# Patient Record
Sex: Male | Born: 1940 | Race: White | Hispanic: No | Marital: Married | Smoking: Former smoker
Health system: Southern US, Community
[De-identification: ages and names within clinical notes are randomized; demographics above are authoritative.]

## PROBLEM LIST (undated history)

## (undated) DIAGNOSIS — E7800 Pure hypercholesterolemia, unspecified: Secondary | ICD-10-CM

## (undated) DIAGNOSIS — H409 Unspecified glaucoma: Secondary | ICD-10-CM

## (undated) DIAGNOSIS — I5042 Chronic combined systolic (congestive) and diastolic (congestive) heart failure: Secondary | ICD-10-CM

## (undated) DIAGNOSIS — M722 Plantar fascial fibromatosis: Secondary | ICD-10-CM

## (undated) DIAGNOSIS — R112 Nausea with vomiting, unspecified: Secondary | ICD-10-CM

## (undated) DIAGNOSIS — T8859XA Other complications of anesthesia, initial encounter: Secondary | ICD-10-CM

## (undated) DIAGNOSIS — R3581 Nocturnal polyuria: Secondary | ICD-10-CM

## (undated) DIAGNOSIS — T4145XA Adverse effect of unspecified anesthetic, initial encounter: Secondary | ICD-10-CM

## (undated) DIAGNOSIS — I10 Essential (primary) hypertension: Secondary | ICD-10-CM

## (undated) DIAGNOSIS — Z9889 Other specified postprocedural states: Secondary | ICD-10-CM

## (undated) DIAGNOSIS — R9431 Abnormal electrocardiogram [ECG] [EKG]: Secondary | ICD-10-CM

## (undated) DIAGNOSIS — R351 Nocturia: Secondary | ICD-10-CM

## (undated) DIAGNOSIS — R3912 Poor urinary stream: Secondary | ICD-10-CM

## (undated) HISTORY — PX: OTHER SURGICAL HISTORY: SHX169

## (undated) HISTORY — DX: Pure hypercholesterolemia, unspecified: E78.00

## (undated) HISTORY — DX: Plantar fascial fibromatosis: M72.2

## (undated) HISTORY — DX: Nocturia: R35.1

## (undated) HISTORY — DX: Chronic combined systolic (congestive) and diastolic (congestive) heart failure: I50.42

## (undated) HISTORY — DX: Essential (primary) hypertension: I10

## (undated) HISTORY — DX: Nocturnal polyuria: R35.81

## (undated) HISTORY — DX: Unspecified glaucoma: H40.9

---

## 1983-01-27 HISTORY — PX: OTHER SURGICAL HISTORY: SHX169

## 2003-02-02 ENCOUNTER — Ambulatory Visit (HOSPITAL_COMMUNITY): Admission: RE | Admit: 2003-02-02 | Discharge: 2003-02-02 | Payer: Self-pay | Admitting: Orthopedic Surgery

## 2009-03-27 ENCOUNTER — Encounter: Admission: RE | Admit: 2009-03-27 | Discharge: 2009-03-27 | Payer: Self-pay | Admitting: Cardiology

## 2010-04-18 ENCOUNTER — Other Ambulatory Visit: Payer: Self-pay | Admitting: *Deleted

## 2010-04-18 DIAGNOSIS — N529 Male erectile dysfunction, unspecified: Secondary | ICD-10-CM

## 2010-04-18 MED ORDER — SILDENAFIL CITRATE 50 MG PO TABS: 50.0000 mg | ORAL_TABLET | Freq: Every day | ORAL | Status: DC | PRN

## 2010-04-18 NOTE — Telephone Encounter (Signed)
Refill requested via fax

## 2010-05-08 ENCOUNTER — Encounter: Payer: Self-pay | Admitting: Cardiology

## 2010-05-08 DIAGNOSIS — R351 Nocturia: Secondary | ICD-10-CM | POA: Insufficient documentation

## 2010-05-08 DIAGNOSIS — I10 Essential (primary) hypertension: Secondary | ICD-10-CM | POA: Insufficient documentation

## 2010-05-08 DIAGNOSIS — M722 Plantar fascial fibromatosis: Secondary | ICD-10-CM | POA: Insufficient documentation

## 2010-05-08 DIAGNOSIS — E7800 Pure hypercholesterolemia, unspecified: Secondary | ICD-10-CM | POA: Insufficient documentation

## 2010-05-09 ENCOUNTER — Ambulatory Visit (INDEPENDENT_AMBULATORY_CARE_PROVIDER_SITE_OTHER): Payer: Medicare Other | Admitting: Cardiology

## 2010-05-09 VITALS — BP 138/78 | HR 64 | Ht 66.0 in | Wt 170.0 lb

## 2010-05-09 DIAGNOSIS — E7800 Pure hypercholesterolemia, unspecified: Secondary | ICD-10-CM

## 2010-05-09 DIAGNOSIS — I10 Essential (primary) hypertension: Secondary | ICD-10-CM

## 2010-05-09 DIAGNOSIS — N400 Enlarged prostate without lower urinary tract symptoms: Secondary | ICD-10-CM | POA: Insufficient documentation

## 2010-05-09 LAB — LIPID PANEL
Cholesterol: 195 mg/dL (ref 0–200)
HDL: 46.9 mg/dL (ref >39.00)
LDL Cholesterol: 120 mg/dL — ABNORMAL HIGH (ref 0–99)
Total CHOL/HDL Ratio: 4
Triglycerides: 143 mg/dL (ref 0.0–149.0)
VLDL: 28.6 mg/dL (ref 0.0–40.0)

## 2010-05-09 LAB — COMPREHENSIVE METABOLIC PANEL
ALT: 30 U/L (ref 0–53)
AST: 22 U/L (ref 0–37)
Albumin: 4.1 g/dL (ref 3.5–5.2)
Alkaline Phosphatase: 53 U/L (ref 39–117)
BUN: 12 mg/dL (ref 6–23)
CO2: 27 mEq/L (ref 19–32)
Calcium: 9.2 mg/dL (ref 8.4–10.5)
Chloride: 106 mEq/L (ref 96–112)
Creatinine, Ser: 0.9 mg/dL (ref 0.4–1.5)
GFR: 91.39 mL/min (ref >60.00)
Glucose, Bld: 94 mg/dL (ref 70–99)
Potassium: 4.6 mEq/L (ref 3.5–5.1)
Sodium: 142 mEq/L (ref 135–145)
Total Bilirubin: 1 mg/dL (ref 0.3–1.2)
Total Protein: 6.5 g/dL (ref 6.0–8.3)

## 2010-05-09 LAB — CBC WITH DIFFERENTIAL/PLATELET
Basophils Absolute: 0 10*3/uL (ref 0.0–0.1)
Basophils Relative: 0.7 % (ref 0.0–3.0)
Eosinophils Absolute: 0.1 10*3/uL (ref 0.0–0.7)
Eosinophils Relative: 2.2 % (ref 0.0–5.0)
HCT: 48.1 % (ref 39.0–52.0)
Hemoglobin: 16.3 g/dL (ref 13.0–17.0)
Lymphocytes Relative: 24.2 % (ref 12.0–46.0)
Lymphs Abs: 1.2 10*3/uL (ref 0.7–4.0)
MCHC: 33.9 g/dL (ref 30.0–36.0)
MCV: 92.8 fl (ref 78.0–100.0)
Monocytes Absolute: 0.6 10*3/uL (ref 0.1–1.0)
Monocytes Relative: 12.1 % — ABNORMAL HIGH (ref 3.0–12.0)
Neutro Abs: 3 10*3/uL (ref 1.4–7.7)
Neutrophils Relative %: 60.8 % (ref 43.0–77.0)
Platelets: 179 10*3/uL (ref 150.0–400.0)
RBC: 5.19 Mil/uL (ref 4.22–5.81)
RDW: 13.1 % (ref 11.5–14.6)
WBC: 4.9 10*3/uL (ref 4.5–10.5)

## 2010-05-09 NOTE — Progress Notes (Signed)
Sherlynn Stalls Date of Birth:  June 29, 1940 [facility] / [facility] [address]   [address] [place], [place]  [address] [phone]           Fax   [phone] DAMEIN GAUNCE Date of Birth:  05/22/1940 [facility] / [facility] [address]   [address] [place], [place]  [address] [phone]           Fax   [phone]  HPI: This pleasant 70 year old gentleman is seen for a six-month followup office visit.  He has been in good general health.  He has had a past history of labile hypertension as well as a history of high cholesterol.  He had a normal treadmill Cardiolite stress test in 2005.  Current Outpatient Prescriptions  Medication Sig Dispense Refill  . aspirin 81 MG tablet Take 81 mg by mouth as needed.       . GuaiFENesin (MUCINEX PO) Take by mouth as needed.        . Multiple Vitamin (MULTIVITAMIN) tablet Take 1 tablet by mouth daily.        . sildenafil (VIAGRA) 50 MG tablet Take 1 tablet (50 mg total) by mouth daily as needed for erectile dysfunction.  3 tablet  3  . rosuvastatin (CRESTOR) 10 MG tablet Take 10 mg by mouth daily.          No Known Allergies  Patient Active Problem List  Diagnoses  . Hypertension  . Hypercholesterolemia  . Nocturnal polyuria  . Plantar fasciitis  . BPH (benign prostatic hyperplasia)    History  Smoking status  . Former Smoker  . Quit date: 01/27/1968  Smokeless tobacco  . Not on file    History  Alcohol Use No    Family History  Problem Relation Age of Onset  . Heart disease Father 69    Review of Systems: The patient denies any heat or cold intolerance.  No weight gain or weight loss.  The patient denies headaches or blurry vision.  There is no cough or sputum production.  The patient denies dizziness.  There is no hematuria or hematochezia.  The patient denies any muscle aches or arthritis.  The patient denies any rash.  The patient denies frequent falling or instability.  There is  no history of depression or anxiety.  All other systems were reviewed and are negative.   Physical Exam: Filed Vitals:   05/09/10 0830  BP: 138/78  Pulse: 64  The general appearance reveals a well-developed well-nourished gentleman in no distress.Pupils equal and reactive.   Extraocular Movements are full.  There is no scleral icterus.  The mouth and pharynx are normal.  The neck is supple.  The carotids reveal no bruits.  The jugular venous pressure is normal.  The thyroid is not enlarged.  There is no lymphadenopathy.The chest is clear to percussion and auscultation. There are no rales or rhonchi. Expansion of the chest is symmetrical.The precordium is quiet.  The first heart sound is normal.  The second heart sound is physiologically split.  There is no murmur gallop rub or click.  There is no abnormal lift or heave.The abdomen is soft and nontender. Bowel sounds are normal. The liver and spleen are not enlarged. There Are no abdominal masses. There are no bruits.The pedal pulses are good.  There is no phlebitis or edema.  There is no cyanosis or clubbing.Strength is normal and symmetrical in all extremities.  There is no lateralizing weakness.  There are no sensory deficits.The skin is warm and dry.  There is no rash.    Assessment / Plan: Continue on prudent cardiac diet.  Recheck in 6 months for followup office visit and lab work.  Blood work from today is pending.

## 2010-05-09 NOTE — Assessment & Plan Note (Signed)
The patient has noted some decreased urinary stream over the past year.  He is having problems with having to go little more frequently.  He's not having any dysuria or symptoms of cystitis or ureter tract infection or prostatitis.  His last PSA was normal in June 2011

## 2010-05-09 NOTE — Assessment & Plan Note (Signed)
The patient has been on low dose Crestor 10 mg daily for his cholesterol.  His not having any myalgias or side effects with the Crestor.

## 2010-05-09 NOTE — Assessment & Plan Note (Signed)
This pleasant gentleman has a past history of labile hypertension.  Since last visit he has lost 12 pounds and he has been more careful with his dietary salt.  He has not been having any chest pain or shortness of breath.

## 2010-05-15 ENCOUNTER — Telehealth: Payer: Self-pay | Admitting: *Deleted

## 2010-05-15 NOTE — Telephone Encounter (Signed)
Advised of labs 

## 2010-05-15 NOTE — Progress Notes (Signed)
Advise patient of labs  

## 2010-05-15 NOTE — Telephone Encounter (Signed)
Message copied by Regis Bill on Thu May 15, 2010  4:10 PM ------      Message from: Cassell Clement      Created: Sun May 11, 2010  4:39 PM       Blood work is satisfactory.  LDL cholesterol120 which is still slightly high.  Stay on Crestor and careful diet.  Liver function studies are normal.

## 2010-10-28 ENCOUNTER — Telehealth: Payer: Self-pay | Admitting: Cardiology

## 2010-10-28 DIAGNOSIS — E785 Hyperlipidemia, unspecified: Secondary | ICD-10-CM

## 2010-10-28 NOTE — Telephone Encounter (Signed)
Pt's wife calling to get order for blood work prior to appt

## 2010-11-12 ENCOUNTER — Other Ambulatory Visit: Payer: Self-pay | Admitting: *Deleted

## 2010-11-12 DIAGNOSIS — N400 Enlarged prostate without lower urinary tract symptoms: Secondary | ICD-10-CM

## 2010-11-12 DIAGNOSIS — Z139 Encounter for screening, unspecified: Secondary | ICD-10-CM

## 2010-11-12 NOTE — Telephone Encounter (Signed)
Thought wife wanted to wait until January to be seen, she called back and scheduled appointment with scheduling today.  Labs ordered for patient

## 2011-01-06 ENCOUNTER — Other Ambulatory Visit (INDEPENDENT_AMBULATORY_CARE_PROVIDER_SITE_OTHER): Payer: Medicare Other | Admitting: *Deleted

## 2011-01-06 ENCOUNTER — Other Ambulatory Visit: Payer: Self-pay | Admitting: Cardiology

## 2011-01-06 DIAGNOSIS — E785 Hyperlipidemia, unspecified: Secondary | ICD-10-CM

## 2011-01-06 DIAGNOSIS — N400 Enlarged prostate without lower urinary tract symptoms: Secondary | ICD-10-CM

## 2011-01-06 LAB — BASIC METABOLIC PANEL
CO2: 26 mEq/L (ref 19–32)
Chloride: 107 mEq/L (ref 96–112)
Creatinine, Ser: 1 mg/dL (ref 0.4–1.5)
GFR: 83.5 mL/min (ref >60.00)
Glucose, Bld: 108 mg/dL — ABNORMAL HIGH (ref 70–99)
Potassium: 4.7 mEq/L (ref 3.5–5.1)
Sodium: 140 mEq/L (ref 135–145)

## 2011-01-06 LAB — HEPATIC FUNCTION PANEL
ALT: 29 U/L (ref 0–53)
AST: 24 U/L (ref 0–37)
Albumin: 4.1 g/dL (ref 3.5–5.2)
Alkaline Phosphatase: 46 U/L (ref 39–117)
Bilirubin, Direct: 0.1 mg/dL (ref 0.0–0.3)
Total Protein: 6.5 g/dL (ref 6.0–8.3)

## 2011-01-06 LAB — PSA: PSA: 2.97 ng/mL (ref 0.10–4.00)

## 2011-01-06 LAB — LIPID PANEL
Cholesterol: 214 mg/dL — ABNORMAL HIGH (ref 0–200)
Total CHOL/HDL Ratio: 5
VLDL: 29.2 mg/dL (ref 0.0–40.0)

## 2011-01-06 LAB — LDL CHOLESTEROL, DIRECT: Direct LDL: 137 mg/dL

## 2011-01-12 ENCOUNTER — Ambulatory Visit (INDEPENDENT_AMBULATORY_CARE_PROVIDER_SITE_OTHER): Payer: Medicare Other | Admitting: Cardiology

## 2011-01-12 ENCOUNTER — Encounter: Payer: Self-pay | Admitting: Cardiology

## 2011-01-12 VITALS — BP 126/78 | HR 80 | Ht 66.0 in | Wt 174.0 lb

## 2011-01-12 DIAGNOSIS — I10 Essential (primary) hypertension: Secondary | ICD-10-CM

## 2011-01-12 DIAGNOSIS — I119 Hypertensive heart disease without heart failure: Secondary | ICD-10-CM

## 2011-01-12 DIAGNOSIS — E7800 Pure hypercholesterolemia, unspecified: Secondary | ICD-10-CM

## 2011-01-12 DIAGNOSIS — N400 Enlarged prostate without lower urinary tract symptoms: Secondary | ICD-10-CM

## 2011-01-12 NOTE — Assessment & Plan Note (Signed)
The patient has a past history of hypercholesterolemia.  He is no longer taking Crestor.  He is trying to watch his cholesterol with diet alone.  He is going to try to increase aerobic exercise and to watch diet better.

## 2011-01-12 NOTE — Patient Instructions (Signed)
Work harder on diet  Your physician recommends that you continue on your current medications as directed. Please refer to the Current Medication list given to you today.  Your physician wants you to follow-up in: 6 months You will receive a reminder letter in the mail two months in advance. If you don't receive a letter, please call our office to schedule the follow-up appointment.

## 2011-01-12 NOTE — Progress Notes (Signed)
Thomas Li Date of Birth:  18-Nov-1940 [facility] / [facility] [address]   [address] [place], [place]  [address] [phone]           Fax   [phone]  History of Present Illness:  This pleasant 70 year old gentleman seen for a six-month followup office visit.  He has a past history hypercholesterolemia BPH and essential hypertension.  Since last visit he has been feeling well.  He has not been expressing any chest pain or shortness of breath.  He has not had any worsening of his symptoms of BPH.  He does not have any history of ischemic heart disease.  He had a normal Cardiolite stress test in 2005.  Current Outpatient Prescriptions  Medication Sig Dispense Refill  . GuaiFENesin (MUCINEX PO) Take by mouth as needed.        . Multiple Vitamin (MULTIVITAMIN) tablet Take 1 tablet by mouth daily.          No Known Allergies  Patient Active Problem List  Diagnoses  . Hypertension  . Hypercholesterolemia  . Nocturnal polyuria  . Plantar fasciitis  . BPH (benign prostatic hyperplasia)    History  Smoking status  . Former Smoker  . Quit date: 01/27/1968  Smokeless tobacco  . Not on file    History  Alcohol Use No    Family History  Problem Relation Age of Onset  . Heart disease Father 5    Review of Systems: Constitutional: no fever chills diaphoresis or fatigue or change in weight.  Head and neck: no hearing loss, no epistaxis, no photophobia or visual disturbance. Respiratory: No cough, shortness of breath or wheezing. Cardiovascular: No chest pain peripheral edema, palpitations. Gastrointestinal: No abdominal distention, no abdominal pain, no change in bowel habits hematochezia or melena. Genitourinary: No dysuria, no frequency, no urgency, no nocturia. Musculoskeletal:No arthralgias, no back pain, no gait disturbance or myalgias. Neurological: No dizziness, no headaches, no numbness, no seizures, no syncope, no weakness, no  tremors. Hematologic: No lymphadenopathy, no easy bruising. Psychiatric: No confusion, no hallucinations, no sleep disturbance.    Physical Exam: Filed Vitals:   01/12/11 0912  BP: 126/78  Pulse: 80   the general appearance reveals a well-developed well-nourished gentleman in no distress.The head and neck exam reveals pupils equal and reactive.  Extraocular movements are full.  There is no scleral icterus.  The mouth and pharynx are normal.  The neck is supple.  The carotids reveal no bruits.  The jugular venous pressure is normal.  The  thyroid is not enlarged.  There is no lymphadenopathy.  The chest is clear to percussion and auscultation.  There are no rales or rhonchi.  Expansion of the chest is symmetrical.  The precordium is quiet.  The first heart sound is normal.  The second heart sound is physiologically split.  There is no murmur gallop rub or click.  There is no abnormal lift or heave.  The abdomen is soft and nontender.  The bowel sounds are normal.  The liver and spleen are not enlarged.  There are no abdominal masses.  There are no abdominal bruits.  Extremities reveal good pedal pulses.  There is no phlebitis or edema.  There is no cyanosis or clubbing.  Strength is normal and symmetrical in all extremities.  There is no lateralizing weakness.  There are no sensory deficits.  The skin is warm and dry.  There is no rash.     Assessment / Plan: Continue present  medication.  Work harder on diet and exercise and weight loss.  He has never had a shingles shot and he was given a prescription to get one at his pharmacy.  Recheck in 6 months for followup office visit fasting lab work and EKG

## 2011-01-12 NOTE — Assessment & Plan Note (Signed)
Blood pressure today remains normal on no blood pressure medication.  He is watching his dietary salt intake.  His overall weight has increased 4 pounds since last visit.

## 2011-01-12 NOTE — Assessment & Plan Note (Signed)
Patient has a history of BPH.  He has nocturia x2 which is no worse than it previously had been.  His PSA today is normal at 2.97.

## 2011-07-17 ENCOUNTER — Other Ambulatory Visit (INDEPENDENT_AMBULATORY_CARE_PROVIDER_SITE_OTHER): Payer: Medicare Other

## 2011-07-17 DIAGNOSIS — I119 Hypertensive heart disease without heart failure: Secondary | ICD-10-CM

## 2011-07-17 DIAGNOSIS — E7800 Pure hypercholesterolemia, unspecified: Secondary | ICD-10-CM

## 2011-07-17 LAB — HEPATIC FUNCTION PANEL
ALT: 30 U/L (ref 0–53)
AST: 21 U/L (ref 0–37)
Albumin: 4.2 g/dL (ref 3.5–5.2)
Alkaline Phosphatase: 45 U/L (ref 39–117)
Bilirubin, Direct: 0.1 mg/dL (ref 0.0–0.3)
Total Bilirubin: 0.7 mg/dL (ref 0.3–1.2)
Total Protein: 6.6 g/dL (ref 6.0–8.3)

## 2011-07-17 LAB — BASIC METABOLIC PANEL
BUN: 14 mg/dL (ref 6–23)
CO2: 26 mEq/L (ref 19–32)
Calcium: 8.9 mg/dL (ref 8.4–10.5)
Chloride: 109 mEq/L (ref 96–112)
Creatinine, Ser: 0.8 mg/dL (ref 0.4–1.5)
GFR: 106.24 mL/min (ref >60.00)
Glucose, Bld: 103 mg/dL — ABNORMAL HIGH (ref 70–99)
Potassium: 4.2 mEq/L (ref 3.5–5.1)
Sodium: 142 mEq/L (ref 135–145)

## 2011-07-17 LAB — LIPID PANEL
Cholesterol: 177 mg/dL (ref 0–200)
HDL: 48.8 mg/dL (ref >39.00)
LDL Cholesterol: 107 mg/dL — ABNORMAL HIGH (ref 0–99)
Total CHOL/HDL Ratio: 4
Triglycerides: 104 mg/dL (ref 0.0–149.0)
VLDL: 20.8 mg/dL (ref 0.0–40.0)

## 2011-07-19 NOTE — Progress Notes (Signed)
Quick Note:  Please make copy of labs for patient visit. ______ 

## 2011-07-24 ENCOUNTER — Encounter: Payer: Self-pay | Admitting: Cardiology

## 2011-07-24 ENCOUNTER — Ambulatory Visit (INDEPENDENT_AMBULATORY_CARE_PROVIDER_SITE_OTHER): Payer: Medicare Other | Admitting: Cardiology

## 2011-07-24 VITALS — BP 126/86 | HR 69 | Ht 66.0 in | Wt 176.0 lb

## 2011-07-24 DIAGNOSIS — L299 Pruritus, unspecified: Secondary | ICD-10-CM

## 2011-07-24 DIAGNOSIS — I447 Left bundle-branch block, unspecified: Secondary | ICD-10-CM

## 2011-07-24 DIAGNOSIS — R351 Nocturia: Secondary | ICD-10-CM

## 2011-07-24 DIAGNOSIS — N529 Male erectile dysfunction, unspecified: Secondary | ICD-10-CM

## 2011-07-24 DIAGNOSIS — E7800 Pure hypercholesterolemia, unspecified: Secondary | ICD-10-CM

## 2011-07-24 DIAGNOSIS — I10 Essential (primary) hypertension: Secondary | ICD-10-CM

## 2011-07-24 DIAGNOSIS — N401 Enlarged prostate with lower urinary tract symptoms: Secondary | ICD-10-CM

## 2011-07-24 DIAGNOSIS — N400 Enlarged prostate without lower urinary tract symptoms: Secondary | ICD-10-CM

## 2011-07-24 MED ORDER — NYSTATIN-TRIAMCINOLONE 100000-0.1 UNIT/GM-% EX CREA: TOPICAL_CREAM | Freq: Four times a day (QID) | CUTANEOUS | Status: DC

## 2011-07-24 MED ORDER — SILDENAFIL CITRATE 50 MG PO TABS: 50.0000 mg | ORAL_TABLET | Freq: Every day | ORAL | Status: DC | PRN

## 2011-07-24 NOTE — Assessment & Plan Note (Signed)
EKG today shows an incomplete left bundle-branch block which is asymptomatic.  No dizzy spells or syncope.  No awareness of irregular pulse.  This may be an intermittent bundle-branch block and we will plan to recheck another EKG in 6 months

## 2011-07-24 NOTE — Assessment & Plan Note (Signed)
The patient has a history of hypercholesterolemia.  His LDL is still slightly elevated.  He is doing better with his low cholesterol diet despite the fact that his weight is up 2 pounds since last visit

## 2011-07-24 NOTE — Patient Instructions (Addendum)
Your physician recommends that you continue on your current medications as directed. Please refer to the Current Medication list given to you today.  Your physician wants you to follow-up in: 6 months with fasting labs (lp/bmet/hfp/psa)  You will receive a reminder letter in the mail two months in advance. If you don't receive a letter, please call our office to schedule the follow-up appointment.    Dr. Patty Sermons has recommended Dr Oliver Barre for primary care

## 2011-07-24 NOTE — Progress Notes (Addendum)
Thomas Li Date of Birth:  Sep 17, 1940 [facility] [address] [place], [place]  [address] [phone]         Fax   [phone]  History of Present Illness: This pleasant 71 year old gentleman is seen for a six-month followup office visit.  He has a past history of cholesterolemia and a history of essential hypertension and BPH.  He does not have any history of ischemic heart disease.  He had a normal Cardiolite stress test in 2005. Current Outpatient Prescriptions  Medication Sig Dispense Refill  . Coenzyme Q10 (CO Q 10 PO) Take by mouth daily.      . Multiple Vitamin (MULTIVITAMIN) tablet Take 1 tablet by mouth daily.        Marland Kitchen nystatin-triamcinolone (MYCOLOG II) cream Apply topically 4 (four) times daily.  30 g  1  . sildenafil (VIAGRA) 50 MG tablet Take 1 tablet (50 mg total) by mouth daily as needed for erectile dysfunction.  6 tablet  5  . DISCONTD: sildenafil (VIAGRA) 50 MG tablet Take 1 tablet (50 mg total) by mouth daily as needed for erectile dysfunction.  3 tablet  3    No Known Allergies  Patient Active Problem List  Diagnosis  . Hypertension  . Hypercholesterolemia  . Nocturnal polyuria  . Plantar fasciitis  . BPH (benign prostatic hyperplasia)  . LBBB (left bundle branch block)    History  Smoking status  . Former Smoker  . Quit date: 01/27/1968  Smokeless tobacco  . Not on file    History  Alcohol Use No    Family History  Problem Relation Age of Onset  . Heart disease Father 56    Review of Systems: Constitutional: no fever chills diaphoresis or fatigue or change in weight.  Head and neck: no hearing loss, no epistaxis, no photophobia or visual disturbance. Respiratory: No cough, shortness of breath or wheezing. Cardiovascular: No chest pain peripheral edema, palpitations. Gastrointestinal: No abdominal distention, no abdominal pain, no change in bowel habits hematochezia or melena. Genitourinary: No dysuria, no  frequency, no urgency, no nocturia. Musculoskeletal:No arthralgias, no back pain, no gait disturbance or myalgias. Neurological: No dizziness, no headaches, no numbness, no seizures, no syncope, no weakness, no tremors. Hematologic: No lymphadenopathy, no easy bruising. Psychiatric: No confusion, no hallucinations, no sleep disturbance.    Physical Exam: Filed Vitals:   07/24/11 1017  BP: 126/86  Pulse: 69   general appearance reveals a well-developed well-nourished gentleman in no distress.The head and neck exam reveals pupils equal and reactive.  Extraocular movements are full.  There is no scleral icterus.  The mouth and pharynx are normal.  The neck is supple.  The carotids reveal no bruits.  The jugular venous pressure is normal.  The  thyroid is not enlarged.  There is no lymphadenopathy.  The chest is clear to percussion and auscultation.  There are no rales or rhonchi.  Expansion of the chest is symmetrical.  The precordium is quiet.  The first heart sound is normal.  The second heart sound is physiologically split.  There is no murmur gallop rub or click.  There is no abnormal lift or heave.  The abdomen is soft and nontender.  The bowel sounds are normal.  The liver and spleen are not enlarged.  There are no abdominal masses.  There are no abdominal bruits.  Extremities reveal good pedal pulses.  There is no phlebitis or edema.  There is no cyanosis or clubbing.  Strength is  normal and symmetrical in all extremities.  There is no lateralizing weakness.  There are no sensory deficits.  The skin is warm and dry.  There is no rash.  EKG shows normal sinus rhythm and left bundle branch block, incomplete.  Cannot totally rule out a preexcitation with a bundle of kent.   Assessment / Plan: Continue same medication.  I also prescribed Mycolog cream for some perianal itching.  We refilled his prescription for Viagra 50 mg tablets #6 we will plan to see him in 6 months for followup office visit  EKG lipid panel hepatic function panel and basal metabolic panel and PSA.  I spoke to him about getting a primary care provider and he will call [place]   internal medicine at the [facility] facility

## 2011-07-24 NOTE — Assessment & Plan Note (Signed)
The patient has a history of BPH with urinary frequency and nocturia x2.  His last PSA was 6 months ago and was normal at 2.96.  He is not having enough urinary symptoms yet to want to see a urologist.

## 2011-07-24 NOTE — Assessment & Plan Note (Signed)
Blood pressure has been remaining stable on a low-salt diet.  He has not been having to take any pressure medicines.  He is not having any chest pain or shortness of breath or palpitations.

## 2012-04-19 ENCOUNTER — Telehealth: Payer: Self-pay | Admitting: Cardiology

## 2012-04-19 DIAGNOSIS — J329 Chronic sinusitis, unspecified: Secondary | ICD-10-CM

## 2012-04-19 MED ORDER — AZITHROMYCIN 250 MG PO TABS: ORAL_TABLET | ORAL | Status: DC

## 2012-04-19 NOTE — Telephone Encounter (Signed)
Advised wife  

## 2012-04-19 NOTE — Telephone Encounter (Signed)
Z-Pak and Mucinex 

## 2012-04-19 NOTE — Telephone Encounter (Signed)
Will forward to  Dr. Brackbill for review 

## 2012-04-19 NOTE — Telephone Encounter (Signed)
New problem    C/O sinus infection . Need an antibiotics

## 2012-04-29 ENCOUNTER — Encounter: Payer: Self-pay | Admitting: Cardiology

## 2012-07-22 ENCOUNTER — Ambulatory Visit: Payer: Medicare Other | Admitting: Internal Medicine

## 2012-08-03 ENCOUNTER — Other Ambulatory Visit: Payer: Self-pay | Admitting: *Deleted

## 2012-08-03 DIAGNOSIS — E7800 Pure hypercholesterolemia, unspecified: Secondary | ICD-10-CM

## 2012-08-03 DIAGNOSIS — N400 Enlarged prostate without lower urinary tract symptoms: Secondary | ICD-10-CM

## 2012-09-01 ENCOUNTER — Other Ambulatory Visit (INDEPENDENT_AMBULATORY_CARE_PROVIDER_SITE_OTHER): Payer: Medicare Other

## 2012-09-01 DIAGNOSIS — N400 Enlarged prostate without lower urinary tract symptoms: Secondary | ICD-10-CM

## 2012-09-01 DIAGNOSIS — E7800 Pure hypercholesterolemia, unspecified: Secondary | ICD-10-CM

## 2012-09-01 LAB — PSA: PSA: 4.38 ng/mL — ABNORMAL HIGH (ref 0.10–4.00)

## 2012-09-01 LAB — LIPID PANEL
Cholesterol: 195 mg/dL (ref 0–200)
HDL: 43.2 mg/dL (ref >39.00)
LDL Cholesterol: 127 mg/dL — ABNORMAL HIGH (ref 0–99)
Total CHOL/HDL Ratio: 5
VLDL: 25 mg/dL (ref 0.0–40.0)

## 2012-09-01 LAB — HEPATIC FUNCTION PANEL
ALT: 36 U/L (ref 0–53)
AST: 23 U/L (ref 0–37)
Albumin: 4.2 g/dL (ref 3.5–5.2)
Alkaline Phosphatase: 47 U/L (ref 39–117)
Bilirubin, Direct: 0.1 mg/dL (ref 0.0–0.3)
Total Bilirubin: 0.9 mg/dL (ref 0.3–1.2)
Total Protein: 6.3 g/dL (ref 6.0–8.3)

## 2012-09-01 LAB — BASIC METABOLIC PANEL
BUN: 17 mg/dL (ref 6–23)
Calcium: 9.2 mg/dL (ref 8.4–10.5)
Chloride: 108 mEq/L (ref 96–112)
Creatinine, Ser: 0.9 mg/dL (ref 0.4–1.5)
GFR: 91.98 mL/min (ref >60.00)
Glucose, Bld: 102 mg/dL — ABNORMAL HIGH (ref 70–99)
Potassium: 4.4 mEq/L (ref 3.5–5.1)

## 2012-09-01 NOTE — Progress Notes (Signed)
Quick Note:  Please report to patient. The recent labs are stable. Continue same medication and careful diet. PSA slightly high. Discuss at OV. Please make copy of labs for OV ______

## 2012-09-05 ENCOUNTER — Encounter: Payer: Self-pay | Admitting: Cardiology

## 2012-09-05 ENCOUNTER — Ambulatory Visit (INDEPENDENT_AMBULATORY_CARE_PROVIDER_SITE_OTHER): Payer: Medicare Other | Admitting: Cardiology

## 2012-09-05 VITALS — BP 144/76 | HR 75 | Ht 66.0 in | Wt 174.8 lb

## 2012-09-05 DIAGNOSIS — E7800 Pure hypercholesterolemia, unspecified: Secondary | ICD-10-CM

## 2012-09-05 DIAGNOSIS — I447 Left bundle-branch block, unspecified: Secondary | ICD-10-CM

## 2012-09-05 DIAGNOSIS — I10 Essential (primary) hypertension: Secondary | ICD-10-CM

## 2012-09-05 DIAGNOSIS — R972 Elevated prostate specific antigen [PSA]: Secondary | ICD-10-CM

## 2012-09-05 NOTE — Patient Instructions (Addendum)
Your appointment with Dr Zenaida Deed is 10/03/12 at 12:45   Your physician recommends that you continue on your current medications as directed. Please refer to the Current Medication list given to you today.  Your physician wants you to follow-up in: 6 months with fasting labs (lp/bmet/hfp)  You will receive a reminder letter in the mail two months in advance. If you don't receive a letter, please call our office to schedule the follow-up appointment.

## 2012-09-05 NOTE — Assessment & Plan Note (Signed)
Patient had a PSA last week which is elevated at 4.38.  He does have symptoms of enlarged prostate including nocturia x2 and decreased force of urinary stream.  He does not have a urologist. We will refer him to Dr. Zenaida Deed or associates.

## 2012-09-05 NOTE — Progress Notes (Signed)
Thomas Li Date of Birth:  07/17/1940 [facility] [address] [place], [place]  [address] [phone]  Fax   [phone]  HPI: This pleasant 72 year old gentleman is seen for a six-month followup office visit. He has a past history of cholesterolemia and a history of essential hypertension and BPH. He does not have any history of ischemic heart disease. He had a normal Cardiolite stress test in 2005.  He denies any new symptoms since last visit.   Current Outpatient Prescriptions  Medication Sig Dispense Refill  . Coenzyme Q10 (CO Q 10 PO) Take by mouth daily.      . Multiple Vitamin (MULTIVITAMIN) tablet Take 1 tablet by mouth daily.        . sildenafil (VIAGRA) 50 MG tablet Take 1 tablet (50 mg total) by mouth daily as needed for erectile dysfunction.  6 tablet  5   No current facility-administered medications for this visit.    No Known Allergies  Patient Active Problem List   Diagnosis Date Noted  . Elevated PSA 09/05/2012  . LBBB (left bundle branch block) 07/24/2011  . BPH (benign prostatic hyperplasia) 05/09/2010  . Hypertension   . Hypercholesterolemia   . Nocturnal polyuria   . Plantar fasciitis     History  Smoking status  . Former Smoker  . Quit date: 01/27/1968  Smokeless tobacco  . Not on file    History  Alcohol Use No    Family History  Problem Relation Age of Onset  . Heart disease Father 36    Review of Systems: The patient denies any heat or cold intolerance.  No weight gain or weight loss.  The patient denies headaches or blurry vision.  There is no cough or sputum production.  The patient denies dizziness.  There is no hematuria or hematochezia.  The patient denies any muscle aches or arthritis.  The patient denies any rash.  The patient denies frequent falling or instability.  There is no history of depression or anxiety.  All other systems were reviewed and are negative.   Physical Exam: Filed Vitals:   09/05/12 1433  BP: 144/76  Pulse: 75   the general appearance reveals a well-developed well-nourished middle-aged gentleman in no distress.The head and neck exam reveals pupils equal and reactive.  Extraocular movements are full.  There is no scleral icterus.  The mouth and pharynx are normal.  The neck is supple.  The carotids reveal no bruits.  The jugular venous pressure is normal.  The  thyroid is not enlarged.  There is no lymphadenopathy.  The chest is clear to percussion and auscultation.  There are no rales or rhonchi.  Expansion of the chest is symmetrical.  The precordium is quiet.  The first heart sound is normal.  The second heart sound is physiologically split.  There is no murmur gallop rub or click.  There is no abnormal lift or heave.  The abdomen is soft and nontender.  The bowel sounds are normal.  The liver and spleen are not enlarged.  There are no abdominal masses.  There are no abdominal bruits.  Extremities reveal good pedal pulses.  There is no phlebitis or edema.  There is no cyanosis or clubbing.  Strength is normal and symmetrical in all extremities.  There is no lateralizing weakness.  There are no sensory deficits.  The skin is warm and dry.  There is no rash.  EKG shows normal sinus rhythm and left bundle branch block.  Assessment / Plan:  Continue prudent diet.  His weight is down 2 pounds since last visit.  Continue same medications. Refer to urology regarding elevated PSA.  Appointment was made for 10/03/12 at 1245. Recheck here 6 months for office visit lipid panel hepatic function panel and basal metabolic panel

## 2012-09-05 NOTE — Assessment & Plan Note (Signed)
EKG again today shows left bundle branch block which is asymptomatic.  No dizziness or syncope or palpitations

## 2012-09-05 NOTE — Assessment & Plan Note (Signed)
The patient is not having any headaches or palpitations or symptoms of CHF.

## 2012-11-28 ENCOUNTER — Other Ambulatory Visit: Payer: Self-pay | Admitting: Cardiology

## 2012-12-29 ENCOUNTER — Telehealth: Payer: Self-pay | Admitting: Cardiology

## 2012-12-29 MED ORDER — AZITHROMYCIN 250 MG PO TABS: ORAL_TABLET | ORAL | Status: DC

## 2012-12-29 NOTE — Telephone Encounter (Signed)
Okay to call in a Zpak today.

## 2012-12-29 NOTE — Telephone Encounter (Signed)
Called patient's wife to inform them that prescription for Zpak has been called in. Appointment for 12/5 will be cancelled. Patient advised to take medication as directed and to call office back should symptoms worsen or not resolve with the treatment. Provided education in regards to staying hydrated and using humidifier to loosen mucus to make cough more productive. Patient's wife verbalized understanding and appreciation of information and assistance.

## 2012-12-29 NOTE — Telephone Encounter (Signed)
New Problem:  Pt's wife states her husband is sick, coughing up stuff, sore throat, and sinus problems.. Pt's wife is requesting Dr. Patty Sermons call him in a Z-pack. Pt's wife is requesting a call back.

## 2012-12-29 NOTE — Telephone Encounter (Signed)
Patient's wife states that Dr. Patty Sermons is their PCP at this time.  Patient having respiratory issues/productive cough (wife unsure of sputum color but knows it is thick mucus)/low grade fever/sore throat > 1 week. Denies throat swelling but appetite is decreased. Difficulty sleeping unless using Nyquil. Requesting Z-Pack. Informed patient's wife that Dr. Patty Sermons is not in clinic today but message and request will be routed to him.  Appointment scheduled for patient for tomorrow, Dec 5th, at 2:00pm, in case Dr. Patty Sermons prefers to see patient in office. Routed to Dr. Patty Sermons.

## 2012-12-30 ENCOUNTER — Ambulatory Visit: Payer: Medicare Other | Admitting: Cardiology

## 2013-01-12 ENCOUNTER — Telehealth: Payer: Self-pay | Admitting: Cardiology

## 2013-01-12 DIAGNOSIS — R0989 Other specified symptoms and signs involving the circulatory and respiratory systems: Secondary | ICD-10-CM

## 2013-01-12 DIAGNOSIS — J029 Acute pharyngitis, unspecified: Secondary | ICD-10-CM

## 2013-01-12 MED ORDER — AZITHROMYCIN 250 MG PO TABS: ORAL_TABLET | ORAL | Status: DC

## 2013-01-12 NOTE — Telephone Encounter (Signed)
Denies fever, non productive cough. Will forward to  Dr. Patty Sermons

## 2013-01-12 NOTE — Telephone Encounter (Signed)
Okay to call in a Z-Pak

## 2013-01-12 NOTE — Telephone Encounter (Signed)
Advised wife  

## 2013-01-12 NOTE — Telephone Encounter (Signed)
New message     Want another zpak---c/o congestion, headache, scratchy throat.  He was better for about a week.  [facility]

## 2013-01-26 HISTORY — PX: OTHER SURGICAL HISTORY: SHX169

## 2013-03-03 ENCOUNTER — Ambulatory Visit (INDEPENDENT_AMBULATORY_CARE_PROVIDER_SITE_OTHER): Payer: Medicare Other

## 2013-03-03 DIAGNOSIS — E7800 Pure hypercholesterolemia, unspecified: Secondary | ICD-10-CM

## 2013-03-03 LAB — LIPID PANEL
CHOL/HDL RATIO: 4
CHOLESTEROL: 215 mg/dL — AB (ref 0–200)
HDL: 48.5 mg/dL (ref >39.00)
Triglycerides: 181 mg/dL — ABNORMAL HIGH (ref 0.0–149.0)
VLDL: 36.2 mg/dL (ref 0.0–40.0)

## 2013-03-03 LAB — BASIC METABOLIC PANEL
BUN: 14 mg/dL (ref 6–23)
CALCIUM: 9.1 mg/dL (ref 8.4–10.5)
CHLORIDE: 105 meq/L (ref 96–112)
CO2: 28 meq/L (ref 19–32)
CREATININE: 0.9 mg/dL (ref 0.4–1.5)
GFR: 93.08 mL/min (ref >60.00)
GLUCOSE: 99 mg/dL (ref 70–99)
Potassium: 4.1 mEq/L (ref 3.5–5.1)
Sodium: 140 mEq/L (ref 135–145)

## 2013-03-03 LAB — HEPATIC FUNCTION PANEL
ALK PHOS: 43 U/L (ref 39–117)
ALT: 27 U/L (ref 0–53)
AST: 21 U/L (ref 0–37)
Albumin: 4.1 g/dL (ref 3.5–5.2)
Bilirubin, Direct: 0 mg/dL (ref 0.0–0.3)
Total Bilirubin: 1 mg/dL (ref 0.3–1.2)
Total Protein: 6.6 g/dL (ref 6.0–8.3)

## 2013-03-06 LAB — LDL CHOLESTEROL, DIRECT: Direct LDL: 148.8 mg/dL

## 2013-03-06 NOTE — Progress Notes (Signed)
Quick Note:  Please make copy of labs for patient visit. ______ 

## 2013-03-08 ENCOUNTER — Ambulatory Visit (INDEPENDENT_AMBULATORY_CARE_PROVIDER_SITE_OTHER): Payer: Medicare Other | Admitting: Cardiology

## 2013-03-08 ENCOUNTER — Encounter: Payer: Self-pay | Admitting: Cardiology

## 2013-03-08 VITALS — BP 138/82 | HR 64 | Wt 177.0 lb

## 2013-03-08 DIAGNOSIS — I10 Essential (primary) hypertension: Secondary | ICD-10-CM

## 2013-03-08 DIAGNOSIS — I447 Left bundle-branch block, unspecified: Secondary | ICD-10-CM

## 2013-03-08 DIAGNOSIS — E7800 Pure hypercholesterolemia, unspecified: Secondary | ICD-10-CM

## 2013-03-08 NOTE — Progress Notes (Signed)
Thomas Li Date of Birth:  Apr 22, 1940 [address] [place], [place]  [address] [phone]  Fax   [phone]  HPI: This pleasant 73 year old gentleman is seen for a six-month followup office visit. He has a past history of cholesterolemia and a history of essential hypertension and BPH. He does not have any history of ischemic heart disease. He had a normal Cardiolite stress test in 2005.  He denies any new symptoms since last visit.  He has BPH symptoms have worsened and he is now on tamsulosin from his urologist. Since we last saw him he has had surgery on his tear ducts bilaterally and has been physically inactive postoperatively.  He attributes his 3 pound weight gain and elevation of lipids to his inactivity.   Current Outpatient Prescriptions  Medication Sig Dispense Refill  . tamsulosin (FLOMAX) 0.4 MG CAPS capsule Take 0.4 mg by mouth at bedtime.      Marland Kitchen azithromycin (ZITHROMAX Z-PAK) 250 MG tablet Take two tablets today and one tablet daily until finished (by mouth).  6 each  0  . Coenzyme Q10 (CO Q 10 PO) Take by mouth daily.      . Multiple Vitamin (MULTIVITAMIN) tablet Take 1 tablet by mouth daily.        Marland Kitchen nystatin-triamcinolone (MYCOLOG II) cream apply to affected area four times a day  30 g  1  . sildenafil (VIAGRA) 50 MG tablet Take 1 tablet (50 mg total) by mouth daily as needed for erectile dysfunction.  6 tablet  5   No current facility-administered medications for this visit.    No Known Allergies  Patient Active Problem List   Diagnosis Date Noted  . Elevated PSA 09/05/2012  . LBBB (left bundle branch block) 07/24/2011  . BPH (benign prostatic hyperplasia) 05/09/2010  . Hypertension   . Hypercholesterolemia   . Nocturnal polyuria     History  Smoking status  . Former Smoker  . Quit date: 01/27/1968  Smokeless tobacco  . Not on file    History  Alcohol Use No    Family History  Problem Relation Age of Onset  . Heart  disease Father 49    Review of Systems: The patient denies any heat or cold intolerance.  No weight gain or weight loss.  The patient denies headaches or blurry vision.  There is no cough or sputum production.  The patient denies dizziness.  There is no hematuria or hematochezia.  The patient denies any muscle aches or arthritis.  The patient denies any rash.  The patient denies frequent falling or instability.  There is no history of depression or anxiety.  All other systems were reviewed and are negative.   Physical Exam: Filed Vitals:   03/08/13 1007  BP: 138/82  Pulse: 64   the general appearance reveals a well-developed well-nourished middle-aged gentleman in no distress.The head and neck exam reveals pupils equal and reactive.  Extraocular movements are full.  There is no scleral icterus.  The mouth and pharynx are normal.  The neck is supple.  The carotids reveal no bruits.  The jugular venous pressure is normal.  The  thyroid is not enlarged.  There is no lymphadenopathy.  The chest is clear to percussion and auscultation.  There are no rales or rhonchi.  Expansion of the chest is symmetrical.  The precordium is quiet.  The first heart sound is normal.  The second heart sound is physiologically split.  There is no murmur  gallop rub or click.  There is no abnormal lift or heave.  The abdomen is soft and nontender.  The bowel sounds are normal.  The liver and spleen are not enlarged.  There are no abdominal masses.  There are no abdominal bruits.  Extremities reveal good pedal pulses.  There is no phlebitis or edema.  There is no cyanosis or clubbing.  Strength is normal and symmetrical in all extremities.  There is no lateralizing weakness.  There are no sensory deficits.  The skin is warm and dry.  There is no rash.      Assessment / Plan:  Continue prudent diet.  His weight is up 3 pounds since last visit.  Continue same medications.  Recheck here 6 months for office visit lipid panel  hepatic function panel and basal metabolic panel and EKG

## 2013-03-08 NOTE — Assessment & Plan Note (Signed)
The patient is not having any symptoms from his left bundle branch block.  No dizziness or syncope.

## 2013-03-08 NOTE — Patient Instructions (Signed)
Your physician recommends that you continue on your current medications as directed. Please refer to the Current Medication list given to you today.  Your physician wants you to follow-up in: 6 months with fasting labs (lp/bmet/hfp)  You will receive a reminder letter in the mail two months in advance. If you don't receive a letter, please call our office to schedule the follow-up appointment.  

## 2013-03-08 NOTE — Assessment & Plan Note (Signed)
Pressure was remaining stable on current regimen.  He is watching his dietary salt.

## 2013-03-08 NOTE — Assessment & Plan Note (Signed)
We reviewed his recent labs.  Elevation of lipids is attributed to weight gain and not enough exercise following his recent eye surgery.  He anticipates that this problem will be resolved by his next visit

## 2013-09-01 ENCOUNTER — Other Ambulatory Visit (INDEPENDENT_AMBULATORY_CARE_PROVIDER_SITE_OTHER): Payer: Medicare Other

## 2013-09-01 DIAGNOSIS — E7800 Pure hypercholesterolemia, unspecified: Secondary | ICD-10-CM

## 2013-09-01 LAB — HEPATIC FUNCTION PANEL
ALBUMIN: 4.1 g/dL (ref 3.5–5.2)
ALK PHOS: 45 U/L (ref 39–117)
ALT: 29 U/L (ref 0–53)
AST: 23 U/L (ref 0–37)
Bilirubin, Direct: 0 mg/dL (ref 0.0–0.3)
TOTAL PROTEIN: 6.4 g/dL (ref 6.0–8.3)
Total Bilirubin: 0.5 mg/dL (ref 0.2–1.2)

## 2013-09-01 LAB — LIPID PANEL
CHOL/HDL RATIO: 5
CHOLESTEROL: 196 mg/dL (ref 0–200)
HDL: 38.9 mg/dL — ABNORMAL LOW (ref >39.00)
NONHDL: 157.1
Triglycerides: 204 mg/dL — ABNORMAL HIGH (ref 0.0–149.0)
VLDL: 40.8 mg/dL — AB (ref 0.0–40.0)

## 2013-09-01 LAB — BASIC METABOLIC PANEL
BUN: 15 mg/dL (ref 6–23)
CO2: 27 meq/L (ref 19–32)
Calcium: 8.8 mg/dL (ref 8.4–10.5)
Chloride: 105 mEq/L (ref 96–112)
Creatinine, Ser: 0.9 mg/dL (ref 0.4–1.5)
GFR: 91.72 mL/min (ref >60.00)
Glucose, Bld: 96 mg/dL (ref 70–99)
POTASSIUM: 4.1 meq/L (ref 3.5–5.1)
SODIUM: 140 meq/L (ref 135–145)

## 2013-09-01 LAB — LDL CHOLESTEROL, DIRECT: LDL DIRECT: 140.4 mg/dL

## 2013-09-04 NOTE — Progress Notes (Signed)
Quick Note:  Please make copy of labs for patient visit. ______ 

## 2013-09-08 ENCOUNTER — Ambulatory Visit (INDEPENDENT_AMBULATORY_CARE_PROVIDER_SITE_OTHER): Payer: Medicare Other | Admitting: Cardiology

## 2013-09-08 ENCOUNTER — Encounter: Payer: Self-pay | Admitting: Cardiology

## 2013-09-08 VITALS — BP 142/80 | HR 70 | Ht 65.0 in | Wt 177.1 lb

## 2013-09-08 DIAGNOSIS — N400 Enlarged prostate without lower urinary tract symptoms: Secondary | ICD-10-CM

## 2013-09-08 DIAGNOSIS — E7800 Pure hypercholesterolemia, unspecified: Secondary | ICD-10-CM

## 2013-09-08 DIAGNOSIS — I158 Other secondary hypertension: Secondary | ICD-10-CM

## 2013-09-08 NOTE — Progress Notes (Signed)
Thomas Li Date of Birth:  06-May-1940 [place] [place], [place]  [address] [phone]        Fax   [phone]   History of Present Illness: This pleasant 73 year old gentleman is seen for a six-month followup office visit. He has a past history of cholesterolemia and a history of essential hypertension and BPH. He does not have any history of ischemic heart disease. He had a normal Cardiolite stress test in 2005. He denies any new symptoms since last visit. He has BPH symptoms have worsened and he is now on tamsulosin from his urologist.  Since last visit he has had no new cardiac symptoms.  His blood pressure is slightly high.  We will ask them to get a new [employer] blood pressure cuff for home use so that he didn't measure his blood pressure and monitor it.   Current Outpatient Prescriptions  Medication Sig Dispense Refill  . azithromycin (ZITHROMAX Z-PAK) 250 MG tablet Take two tablets today and one tablet daily until finished (by mouth).  6 each  0  . Coenzyme Q10 (CO Q 10 PO) Take by mouth daily.      . Multiple Vitamin (MULTIVITAMIN) tablet Take 1 tablet by mouth daily.        Marland Kitchen nystatin-triamcinolone (MYCOLOG II) cream apply to affected area four times a day  30 g  1  . tamsulosin (FLOMAX) 0.4 MG CAPS capsule Take 0.4 mg by mouth at bedtime.      . sildenafil (VIAGRA) 50 MG tablet Take 1 tablet (50 mg total) by mouth daily as needed for erectile dysfunction.  6 tablet  5   No current facility-administered medications for this visit.    No Known Allergies  Patient Active Problem List   Diagnosis Date Noted  . Elevated PSA 09/05/2012  . LBBB (left bundle branch block) 07/24/2011  . BPH (benign prostatic hyperplasia) 05/09/2010  . Hypertension   . Hypercholesterolemia   . Nocturnal polyuria     History  Smoking status  . Former Smoker  . Quit date: 01/27/1968  Smokeless tobacco  . Not on file    History  Alcohol Use No      Family History  Problem Relation Age of Onset  . Heart disease Father 79    Review of Systems: Constitutional: no fever chills diaphoresis or fatigue or change in weight.  Head and neck: no hearing loss, no epistaxis, no photophobia or visual disturbance. Respiratory: No cough, shortness of breath or wheezing. Cardiovascular: No chest pain peripheral edema, palpitations. Gastrointestinal: No abdominal distention, no abdominal pain, no change in bowel habits hematochezia or melena. Genitourinary: No dysuria, no frequency, no urgency, no nocturia. Musculoskeletal:No arthralgias, no back pain, no gait disturbance or myalgias. Neurological: No dizziness, no headaches, no numbness, no seizures, no syncope, no weakness, no tremors. Hematologic: No lymphadenopathy, no easy bruising. Psychiatric: No confusion, no hallucinations, no sleep disturbance.    Physical Exam: Filed Vitals:   09/08/13 0934  BP: 142/80  Pulse:    the general appearance reveals a well-developed well-nourished gentleman in no distress.The head and neck exam reveals pupils equal and reactive.  Extraocular movements are full.  There is no scleral icterus.  The mouth and pharynx are normal.  The neck is supple.  The carotids reveal no bruits.  The jugular venous pressure is normal.  The  thyroid is not enlarged.  There is no lymphadenopathy.  The chest is clear to percussion  and auscultation.  There are no rales or rhonchi.  Expansion of the chest is symmetrical.  The precordium is quiet.  The first heart sound is normal.  The second heart sound is physiologically split.  There is no murmur gallop rub or click.  There is no abnormal lift or heave.  The abdomen is soft and nontender.  The bowel sounds are normal.  The liver and spleen are not enlarged.  There are no abdominal masses.  There are no abdominal bruits.  Extremities reveal good pedal pulses.  There is no phlebitis or edema.  There is no cyanosis or clubbing.   Strength is normal and symmetrical in all extremities.  There is no lateralizing weakness.  There are no sensory deficits.  The skin is warm and dry.  There is no rash.    Assessment / Plan: 1.  Essential hypertension. 2. Hypercholesterolemia 3. BPH  Disposition: Lose weight.  Recheck in 6 months for office visit, EKG, lipid panel, hepatic function panel and basal metabolic

## 2013-09-08 NOTE — Patient Instructions (Signed)
GET AN [facility]ON BLOOD PRESSURE MACHINE AND START MONITORING YOUR BLOOD PRESSURE. CALL IN A FEW WEEKS WITH READINGS  Work on diet and weight loss, watch your carbohydrate intake   Your physician recommends that you continue on your current medications as directed. Please refer to the Current Medication list given to you today.  Your physician wants you to follow-up in: 6 months with fasting labs (lp/bmet/hfp) and ekg  You will receive a reminder letter in the mail two months in advance. If you don't receive a letter, please call our office to schedule the follow-up appointment.

## 2013-09-08 NOTE — Assessment & Plan Note (Signed)
The patient is not on any cholesterol-lowering medication.  We will ask him to adhere to a careful diet and plan to recheck fasting lipids and his next visit.

## 2013-09-08 NOTE — Assessment & Plan Note (Signed)
Followed by urology.   

## 2013-09-08 NOTE — Assessment & Plan Note (Signed)
The patient remains mildly overweight.  Blood pressure today is borderline high.  He needs to continue to watch his diet carefully and to avoid salt.  Check blood pressure at home and let us know if it remains persistently high.Marland Kitchen

## 2014-02-28 ENCOUNTER — Other Ambulatory Visit (INDEPENDENT_AMBULATORY_CARE_PROVIDER_SITE_OTHER): Payer: Medicare Other | Admitting: *Deleted

## 2014-02-28 DIAGNOSIS — E7800 Pure hypercholesterolemia, unspecified: Secondary | ICD-10-CM

## 2014-02-28 LAB — BASIC METABOLIC PANEL
BUN: 15 mg/dL (ref 6–23)
CALCIUM: 9.3 mg/dL (ref 8.4–10.5)
CO2: 29 mEq/L (ref 19–32)
Chloride: 107 mEq/L (ref 96–112)
Creatinine, Ser: 0.95 mg/dL (ref 0.40–1.50)
GFR: 82.75 mL/min (ref >60.00)
Glucose, Bld: 107 mg/dL — ABNORMAL HIGH (ref 70–99)
POTASSIUM: 4.3 meq/L (ref 3.5–5.1)
Sodium: 140 mEq/L (ref 135–145)

## 2014-02-28 LAB — LIPID PANEL
CHOL/HDL RATIO: 4
CHOLESTEROL: 196 mg/dL (ref 0–200)
HDL: 46.1 mg/dL (ref >39.00)
LDL Cholesterol: 121 mg/dL — ABNORMAL HIGH (ref 0–99)
NONHDL: 149.9
TRIGLYCERIDES: 144 mg/dL (ref 0.0–149.0)
VLDL: 28.8 mg/dL (ref 0.0–40.0)

## 2014-02-28 LAB — HEPATIC FUNCTION PANEL
ALK PHOS: 45 U/L (ref 39–117)
ALT: 30 U/L (ref 0–53)
AST: 21 U/L (ref 0–37)
Albumin: 4.4 g/dL (ref 3.5–5.2)
BILIRUBIN TOTAL: 0.6 mg/dL (ref 0.2–1.2)
Bilirubin, Direct: 0.1 mg/dL (ref 0.0–0.3)
Total Protein: 6.5 g/dL (ref 6.0–8.3)

## 2014-02-28 NOTE — Progress Notes (Signed)
Quick Note:  Please make copy of labs for patient visit. ______ 

## 2014-03-07 ENCOUNTER — Encounter: Payer: Self-pay | Admitting: Cardiology

## 2014-03-07 ENCOUNTER — Ambulatory Visit (INDEPENDENT_AMBULATORY_CARE_PROVIDER_SITE_OTHER): Payer: Medicare Other | Admitting: Cardiology

## 2014-03-07 VITALS — BP 140/74 | HR 91 | Ht 66.0 in | Wt 178.0 lb

## 2014-03-07 DIAGNOSIS — E7800 Pure hypercholesterolemia, unspecified: Secondary | ICD-10-CM

## 2014-03-07 NOTE — Progress Notes (Signed)
Cardiology Office Note   Date:  03/07/2014   ID:  Thomas Li, DOB [birth year], MRN [id number]  PCP:  Coletta Memos, PA-C  Cardiologist:   Darlin Coco, MD   No chief complaint on file.     History of Present Illness: Thomas Li is a 74 y.o. male who presents for scheduled follow-up office visit This pleasant 74 year old gentleman is seen for a six-month followup office visit. He has a past history of cholesterolemia and a history of essential hypertension and BPH. He does not have any history of ischemic heart disease. He had a normal Cardiolite stress test in 2005. He denies any new symptoms since last visit. He has BPH symptoms have worsened and he is now on tamsulosin from his urologist Dr. Junious Silk.  He has had a good response and has nocturia just 0 to one time a night Since last visit he has had no new cardiac symptoms. His blood pressure is slightly high. We will ask them to get a new [employer] blood pressure cuff for home use so that he didn't measure his blood pressure and monitor it.   Past Medical History  Diagnosis Date  . Hypertension     BORDERLINE  . Hypercholesterolemia   . Nocturnal polyuria   . Plantar fasciitis     No past surgical history on file.   Current Outpatient Prescriptions  Medication Sig Dispense Refill  . azithromycin (ZITHROMAX Z-PAK) 250 MG tablet Take two tablets today and one tablet daily until finished (by mouth). 6 each 0  . Coenzyme Q10 (CO Q 10 PO) Take by mouth daily.    . Multiple Vitamin (MULTIVITAMIN) tablet Take 1 tablet by mouth daily.      Marland Kitchen nystatin-triamcinolone (MYCOLOG II) cream apply to affected area four times a day 30 g 1  . sildenafil (VIAGRA) 50 MG tablet Take 1 tablet (50 mg total) by mouth daily as needed for erectile dysfunction. 6 tablet 5  . tamsulosin (FLOMAX) 0.4 MG CAPS capsule Take 0.4 mg by mouth at bedtime.     No current facility-administered medications for this visit.    Allergies:    Review of patient's allergies indicates no known allergies.    Social History:  The patient  reports that he quit smoking about 46 years ago. He does not have any smokeless tobacco history on file. He reports that he does not drink alcohol or use illicit drugs.   Family History:  The patient's family history includes Heart disease (age of onset: 28) in his father.    ROS:  Please see the history of present illness.   Otherwise, review of systems are positive for none.   All other systems are reviewed and negative.    PHYSICAL EXAM: VS:  BP 140/74 mmHg  Pulse 91  Ht 5\' 6"  (1.676 m)  Wt 178 lb (80.74 kg)  BMI 28.74 kg/m2 , BMI Body mass index is 28.74 kg/(m^2). GEN: Well nourished, well developed, in no acute distress HEENT: normal Neck: no JVD, carotid bruits, or masses Cardiac: RRR; no murmurs, rubs, or gallops,no edema  Respiratory:  clear to auscultation bilaterally, normal work of breathing GI: soft, nontender, nondistended, + BS MS: no deformity or atrophy Skin: warm and dry, no rash Neuro:  Strength and sensation are intact Psych: euthymic mood, full affect   EKG:  EKG is not ordered today.    Recent Labs: 02/28/2014: ALT 30; BUN 15; Creatinine 0.95; Potassium 4.3; Sodium 140    Lipid Panel  Component Value Date/Time   CHOL 196 02/28/2014 0740   TRIG 144.0 02/28/2014 0740   HDL 46.10 02/28/2014 0740   CHOLHDL 4 02/28/2014 0740   VLDL 28.8 02/28/2014 0740   LDLCALC 121* 02/28/2014 0740   LDLDIRECT 140.4 09/01/2013 0738      Wt Readings from Last 3 Encounters:  03/07/14 178 lb (80.74 kg)  09/08/13 177 lb 1.9 oz (80.341 kg)  03/08/13 177 lb (80.287 kg)     ASSESSMENT AND PLAN:  1. Essential hypertension. 2. Hypercholesterolemia 3. BPH   Current medicines are reviewed at length with the patient today.  The patient does not have concerns regarding medicines.  The following changes have been made:  no change  Labs/ tests ordered today include:    Orders Placed This Encounter  Procedures  . Lipid panel  . Hepatic function panel  . Basic metabolic panel    I have encouraged him to continue to watch his diet carefully and to try to lose more weight.  He will try to get down to 175 pounds by next visit.  Watch carbohydrates to bring down triglycerides. Disposition:   FU with Dr. Mare Ferrari in 6 months for office visit EKG lipid panel hepatic function panel and basal metabolic panel   Signed, Darlin Coco, MD  03/07/2014 8:43 AM    [facility] [place], [place], [place]  [address] Phone: [phone]; Fax: [phone]

## 2014-03-07 NOTE — Patient Instructions (Signed)
Your physician recommends that you continue on your current medications as directed. Please refer to the Current Medication list given to you today.  Your physician wants you to follow-up in: 6 months with fasting labs (lp/bmet/hfp)  You will receive a reminder letter in the mail two months in advance. If you don't receive a letter, please call our office to schedule the follow-up appointment.  

## 2014-03-26 ENCOUNTER — Other Ambulatory Visit: Payer: Self-pay | Admitting: Cardiology

## 2014-03-26 DIAGNOSIS — L309 Dermatitis, unspecified: Secondary | ICD-10-CM

## 2014-03-28 DIAGNOSIS — Z85828 Personal history of other malignant neoplasm of skin: Secondary | ICD-10-CM | POA: Diagnosis not present

## 2014-03-28 DIAGNOSIS — L570 Actinic keratosis: Secondary | ICD-10-CM | POA: Diagnosis not present

## 2014-03-28 DIAGNOSIS — D485 Neoplasm of uncertain behavior of skin: Secondary | ICD-10-CM | POA: Diagnosis not present

## 2014-04-15 DIAGNOSIS — I10 Essential (primary) hypertension: Secondary | ICD-10-CM | POA: Diagnosis not present

## 2014-04-15 DIAGNOSIS — W57XXXA Bitten or stung by nonvenomous insect and other nonvenomous arthropods, initial encounter: Secondary | ICD-10-CM | POA: Diagnosis not present

## 2014-05-28 DIAGNOSIS — H402231 Chronic angle-closure glaucoma, bilateral, mild stage: Secondary | ICD-10-CM | POA: Diagnosis not present

## 2014-05-28 DIAGNOSIS — H2513 Age-related nuclear cataract, bilateral: Secondary | ICD-10-CM | POA: Diagnosis not present

## 2014-05-28 DIAGNOSIS — H409 Unspecified glaucoma: Secondary | ICD-10-CM | POA: Diagnosis not present

## 2014-05-28 DIAGNOSIS — H35373 Puckering of macula, bilateral: Secondary | ICD-10-CM | POA: Diagnosis not present

## 2014-08-15 DIAGNOSIS — N401 Enlarged prostate with lower urinary tract symptoms: Secondary | ICD-10-CM | POA: Diagnosis not present

## 2014-09-03 DIAGNOSIS — H40053 Ocular hypertension, bilateral: Secondary | ICD-10-CM | POA: Diagnosis not present

## 2014-09-03 DIAGNOSIS — H402231 Chronic angle-closure glaucoma, bilateral, mild stage: Secondary | ICD-10-CM | POA: Diagnosis not present

## 2014-09-11 DIAGNOSIS — H402231 Chronic angle-closure glaucoma, bilateral, mild stage: Secondary | ICD-10-CM | POA: Diagnosis not present

## 2014-09-20 DIAGNOSIS — R972 Elevated prostate specific antigen [PSA]: Secondary | ICD-10-CM | POA: Diagnosis not present

## 2014-09-25 DIAGNOSIS — H402221 Chronic angle-closure glaucoma, left eye, mild stage: Secondary | ICD-10-CM | POA: Diagnosis not present

## 2014-11-08 DIAGNOSIS — R972 Elevated prostate specific antigen [PSA]: Secondary | ICD-10-CM | POA: Diagnosis not present

## 2014-11-08 DIAGNOSIS — H402231 Chronic angle-closure glaucoma, bilateral, mild stage: Secondary | ICD-10-CM | POA: Diagnosis not present

## 2014-11-21 ENCOUNTER — Other Ambulatory Visit (INDEPENDENT_AMBULATORY_CARE_PROVIDER_SITE_OTHER): Payer: Medicare Other | Admitting: *Deleted

## 2014-11-21 DIAGNOSIS — I158 Other secondary hypertension: Secondary | ICD-10-CM

## 2014-11-21 DIAGNOSIS — R972 Elevated prostate specific antigen [PSA]: Secondary | ICD-10-CM

## 2014-11-21 DIAGNOSIS — N400 Enlarged prostate without lower urinary tract symptoms: Secondary | ICD-10-CM | POA: Diagnosis not present

## 2014-11-21 DIAGNOSIS — E7800 Pure hypercholesterolemia, unspecified: Secondary | ICD-10-CM | POA: Diagnosis not present

## 2014-11-21 LAB — LIPID PANEL
Cholesterol: 182 mg/dL (ref 125–200)
HDL: 43 mg/dL (ref >=40)
LDL Cholesterol: 114 mg/dL (ref <130)
TRIGLYCERIDES: 127 mg/dL (ref <150)
Total CHOL/HDL Ratio: 4.2 Ratio (ref <=5.0)
VLDL: 25 mg/dL (ref <30)

## 2014-11-21 LAB — BASIC METABOLIC PANEL
BUN: 14 mg/dL (ref 7–25)
CALCIUM: 8.7 mg/dL (ref 8.6–10.3)
CO2: 26 mmol/L (ref 20–31)
CREATININE: 0.9 mg/dL (ref 0.70–1.18)
Chloride: 105 mmol/L (ref 98–110)
Glucose, Bld: 111 mg/dL — ABNORMAL HIGH (ref 65–99)
Potassium: 4.3 mmol/L (ref 3.5–5.3)
Sodium: 141 mmol/L (ref 135–146)

## 2014-11-21 LAB — HEPATIC FUNCTION PANEL
ALBUMIN: 4.1 g/dL (ref 3.6–5.1)
ALK PHOS: 47 U/L (ref 40–115)
ALT: 31 U/L (ref 9–46)
AST: 23 U/L (ref 10–35)
Bilirubin, Direct: 0.1 mg/dL (ref <=0.2)
Indirect Bilirubin: 0.5 mg/dL (ref 0.2–1.2)
TOTAL PROTEIN: 6.1 g/dL (ref 6.1–8.1)
Total Bilirubin: 0.6 mg/dL (ref 0.2–1.2)

## 2014-11-21 NOTE — Addendum Note (Signed)
Addended by: Eulis Foster on: 11/21/2014 07:49 AM   Modules accepted: Orders

## 2014-11-21 NOTE — Addendum Note (Signed)
Addended by: Eulis Foster on: 11/21/2014 07:47 AM   Modules accepted: Orders

## 2014-11-22 LAB — PSA: PSA: 4.15 ng/mL — AB (ref <=4.00)

## 2014-11-22 NOTE — Progress Notes (Signed)
Quick Note:  Please make copy of labs for patient visit. ______ 

## 2014-11-23 ENCOUNTER — Other Ambulatory Visit: Payer: Medicare Other

## 2014-11-30 ENCOUNTER — Ambulatory Visit (INDEPENDENT_AMBULATORY_CARE_PROVIDER_SITE_OTHER): Payer: Medicare Other | Admitting: Cardiology

## 2014-11-30 ENCOUNTER — Encounter: Payer: Self-pay | Admitting: Cardiology

## 2014-11-30 VITALS — BP 142/82 | HR 69 | Ht 66.0 in | Wt 175.4 lb

## 2014-11-30 DIAGNOSIS — I447 Left bundle-branch block, unspecified: Secondary | ICD-10-CM

## 2014-11-30 DIAGNOSIS — E7800 Pure hypercholesterolemia, unspecified: Secondary | ICD-10-CM | POA: Diagnosis not present

## 2014-11-30 DIAGNOSIS — N400 Enlarged prostate without lower urinary tract symptoms: Secondary | ICD-10-CM | POA: Diagnosis not present

## 2014-11-30 NOTE — Patient Instructions (Signed)
Medication Instructions:  Your physician recommends that you continue on your current medications as directed. Please refer to the Current Medication list given to you today.  Labwork: none  Testing/Procedures: none  Follow-Up: Your physician recommends that you schedule a follow-up appointment in: 6 month ov/ekg with Tera Helper NP or Brynda Rim PA   If you need a refill on your cardiac medications before your next appointment, please call your pharmacy.

## 2014-11-30 NOTE — Progress Notes (Signed)
Cardiology Office Note   Date:  11/30/2014   ID:  Thomas Li, DOB August 22, 1940, MRN [id number]  PCP:  Coletta Memos, PA-C  Cardiologist: Darlin Coco MD  Chief Complaint  Patient presents with  . Hypertension      History of Present Illness: Thomas Li is a 74 y.o. male who presents for a six-month follow-up office visit  . He has a past history of cholesterolemia and a history of essential hypertension and BPH. He does not have any history of ischemic heart disease. He had a normal Cardiolite stress test in 2005.  He has a known left bundle branch block. He denies any new symptoms since last visit. He has BPH symptoms have worsened and he is now on tamsulosin from his urologist Dr. Junious Silk. He has had a good response and has nocturia just 0 to one time a night.  He has a chronically mildly elevated PSA level. Since last visit he has had no new cardiac symptoms. He denies any chest pain or shortness of breath or dizziness or syncope. He has a perirectal rash.  He will see a dermatologist. Past Medical History  Diagnosis Date  . Hypertension     BORDERLINE  . Hypercholesterolemia   . Nocturnal polyuria   . Plantar fasciitis     No past surgical history on file.   Current Outpatient Prescriptions  Medication Sig Dispense Refill  . Coenzyme Q10 (CO Q 10 PO) Take 1 tablet by mouth 2 (two) times daily.     Marland Kitchen latanoprost (XALATAN) 0.005 % ophthalmic solution Place 1 drop into both eyes at bedtime.  0  . Multiple Vitamin (MULTIVITAMIN) tablet Take 1 tablet by mouth daily.      Marland Kitchen nystatin-triamcinolone (MYCOLOG II) cream apply to affected area four times a day 30 g 1  . tamsulosin (FLOMAX) 0.4 MG CAPS capsule Take 0.4 mg by mouth at bedtime.    . sildenafil (VIAGRA) 50 MG tablet Take 1 tablet (50 mg total) by mouth daily as needed for erectile dysfunction. 6 tablet 5   No current facility-administered medications for this visit.    Allergies:   Review of  patient's allergies indicates no known allergies.    Social History:  The patient  reports that he quit smoking about 46 years ago. He does not have any smokeless tobacco history on file. He reports that he does not drink alcohol or use illicit drugs.   Family History:  The patient's family history includes Heart disease (age of onset: 25) in his father.    ROS:  Please see the history of present illness.   Otherwise, review of systems are positive for none.   All other systems are reviewed and negative.    PHYSICAL EXAM: VS:  BP 142/82 mmHg  Pulse 69  Ht 5\' 6"  (1.676 m)  Wt 175 lb 6.4 oz (79.561 kg)  BMI 28.32 kg/m2 , BMI Body mass index is 28.32 kg/(m^2). GEN: Well nourished, well developed, in no acute distress HEENT: normal Neck: no JVD, carotid bruits, or masses Cardiac: RRR; no murmurs, rubs, or gallops,no edema  Respiratory:  clear to auscultation bilaterally, normal work of breathing GI: soft, nontender, nondistended, + BS MS: no deformity or atrophy Skin: warm and dry, no rash Neuro:  Strength and sensation are intact Psych: euthymic mood, full affect   EKG:  EKG is ordered today. The ekg ordered today demonstrates normal sinus rhythm.  Left bundle branch block.  Since prior tracing, no  significant change.   Recent Labs: 11/21/2014: ALT 31; BUN 14; Creat 0.90; Potassium 4.3; Sodium 141    Lipid Panel    Component Value Date/Time   CHOL 182 11/21/2014 0749   TRIG 127 11/21/2014 0749   HDL 43 11/21/2014 0749   CHOLHDL 4.2 11/21/2014 0749   VLDL 25 11/21/2014 0749   LDLCALC 114 11/21/2014 0749   LDLDIRECT 140.4 09/01/2013 0738      Wt Readings from Last 3 Encounters:  11/30/14 175 lb 6.4 oz (79.561 kg)  03/07/14 178 lb (80.74 kg)  09/08/13 177 lb 1.9 oz (80.341 kg)       ASSESSMENT AND PLAN:  1. Essential hypertension. 2. Hypercholesterolemia 3. BPH 4. LBBB  Current medicines are reviewed at length with the patient today.  The patient does not  have concerns regarding medicines.  The following changes have been made:  no change  Labs/ tests ordered today include:   Orders Placed This Encounter  Procedures  . EKG 12-Lead   Disposition: Continue current medication.  Recheck in 6 months for follow-up office visit and EKG.  Lab work today was satisfactory.  Berna Spare MD 11/30/2014 10:10 AM    [facility] [place], [place], [place]  [address] Phone: [phone]; Fax: [phone]

## 2014-12-17 DIAGNOSIS — D225 Melanocytic nevi of trunk: Secondary | ICD-10-CM | POA: Diagnosis not present

## 2014-12-17 DIAGNOSIS — L290 Pruritus ani: Secondary | ICD-10-CM | POA: Diagnosis not present

## 2014-12-17 DIAGNOSIS — L570 Actinic keratosis: Secondary | ICD-10-CM | POA: Diagnosis not present

## 2014-12-17 DIAGNOSIS — L219 Seborrheic dermatitis, unspecified: Secondary | ICD-10-CM | POA: Diagnosis not present

## 2014-12-17 DIAGNOSIS — Z86018 Personal history of other benign neoplasm: Secondary | ICD-10-CM | POA: Diagnosis not present

## 2014-12-17 DIAGNOSIS — L821 Other seborrheic keratosis: Secondary | ICD-10-CM | POA: Diagnosis not present

## 2014-12-24 ENCOUNTER — Telehealth: Payer: Self-pay | Admitting: Cardiology

## 2014-12-24 NOTE — Telephone Encounter (Signed)
New Message   Pt wife is calling for Rip Harbour she wants to have you call her between now   and 9:00am and she should be back by 10am so she dont miss your call

## 2014-12-24 NOTE — Telephone Encounter (Signed)
Spoke with wife and sent recent PSA to Dr Junious Silk as requested

## 2015-03-11 DIAGNOSIS — L821 Other seborrheic keratosis: Secondary | ICD-10-CM | POA: Diagnosis not present

## 2015-03-11 DIAGNOSIS — C44612 Basal cell carcinoma of skin of right upper limb, including shoulder: Secondary | ICD-10-CM | POA: Diagnosis not present

## 2015-03-11 DIAGNOSIS — Z23 Encounter for immunization: Secondary | ICD-10-CM | POA: Diagnosis not present

## 2015-03-11 DIAGNOSIS — D485 Neoplasm of uncertain behavior of skin: Secondary | ICD-10-CM | POA: Diagnosis not present

## 2015-03-11 DIAGNOSIS — L570 Actinic keratosis: Secondary | ICD-10-CM | POA: Diagnosis not present

## 2015-03-11 DIAGNOSIS — L219 Seborrheic dermatitis, unspecified: Secondary | ICD-10-CM | POA: Diagnosis not present

## 2015-04-12 DIAGNOSIS — R05 Cough: Secondary | ICD-10-CM | POA: Diagnosis not present

## 2015-04-12 DIAGNOSIS — J209 Acute bronchitis, unspecified: Secondary | ICD-10-CM | POA: Diagnosis not present

## 2015-04-15 DIAGNOSIS — R972 Elevated prostate specific antigen [PSA]: Secondary | ICD-10-CM | POA: Diagnosis not present

## 2015-04-22 DIAGNOSIS — R972 Elevated prostate specific antigen [PSA]: Secondary | ICD-10-CM | POA: Diagnosis not present

## 2015-04-22 DIAGNOSIS — Z0000 Encounter for general adult medical examination without abnormal findings: Secondary | ICD-10-CM | POA: Diagnosis not present

## 2015-04-24 DIAGNOSIS — D0461 Carcinoma in situ of skin of right upper limb, including shoulder: Secondary | ICD-10-CM | POA: Diagnosis not present

## 2015-05-30 DIAGNOSIS — R972 Elevated prostate specific antigen [PSA]: Secondary | ICD-10-CM | POA: Diagnosis not present

## 2015-06-07 ENCOUNTER — Encounter: Payer: Self-pay | Admitting: Nurse Practitioner

## 2015-06-07 ENCOUNTER — Ambulatory Visit: Payer: Medicare Other | Admitting: Nurse Practitioner

## 2015-06-07 ENCOUNTER — Telehealth: Payer: Self-pay | Admitting: *Deleted

## 2015-06-07 ENCOUNTER — Ambulatory Visit (INDEPENDENT_AMBULATORY_CARE_PROVIDER_SITE_OTHER): Payer: Medicare Other | Admitting: Nurse Practitioner

## 2015-06-07 VITALS — BP 180/100 | HR 88 | Ht 66.0 in | Wt 179.1 lb

## 2015-06-07 DIAGNOSIS — R011 Cardiac murmur, unspecified: Secondary | ICD-10-CM | POA: Diagnosis not present

## 2015-06-07 DIAGNOSIS — I10 Essential (primary) hypertension: Secondary | ICD-10-CM | POA: Diagnosis not present

## 2015-06-07 DIAGNOSIS — E7800 Pure hypercholesterolemia, unspecified: Secondary | ICD-10-CM | POA: Diagnosis not present

## 2015-06-07 DIAGNOSIS — I447 Left bundle-branch block, unspecified: Secondary | ICD-10-CM

## 2015-06-07 LAB — BASIC METABOLIC PANEL
BUN: 11 mg/dL (ref 7–25)
CO2: 25 mmol/L (ref 20–31)
Calcium: 8.6 mg/dL (ref 8.6–10.3)
Chloride: 106 mmol/L (ref 98–110)
Creat: 0.93 mg/dL (ref 0.70–1.18)
Glucose, Bld: 119 mg/dL — ABNORMAL HIGH (ref 65–99)
Potassium: 4 mmol/L (ref 3.5–5.3)
Sodium: 140 mmol/L (ref 135–146)

## 2015-06-07 LAB — HEPATIC FUNCTION PANEL
ALT: 38 U/L (ref 9–46)
AST: 27 U/L (ref 10–35)
Albumin: 4.2 g/dL (ref 3.6–5.1)
Alkaline Phosphatase: 46 U/L (ref 40–115)
Bilirubin, Direct: 0.1 mg/dL (ref <=0.2)
Indirect Bilirubin: 0.3 mg/dL (ref 0.2–1.2)
Total Bilirubin: 0.4 mg/dL (ref 0.2–1.2)
Total Protein: 6.4 g/dL (ref 6.1–8.1)

## 2015-06-07 LAB — LIPID PANEL
Cholesterol: 192 mg/dL (ref 125–200)
HDL: 39 mg/dL — ABNORMAL LOW (ref >=40)
LDL Cholesterol: 97 mg/dL (ref <130)
Total CHOL/HDL Ratio: 4.9 Ratio (ref <=5.0)
Triglycerides: 280 mg/dL — ABNORMAL HIGH (ref <150)
VLDL: 56 mg/dL — ABNORMAL HIGH (ref <30)

## 2015-06-07 NOTE — Telephone Encounter (Signed)
error 

## 2015-06-07 NOTE — Progress Notes (Addendum)
CARDIOLOGY OFFICE NOTE  Date:  06/07/2015    Thomas Li Date of Birth: [birth year] Medical Record [id number]  PCP:  Coletta Memos, PA-C  Cardiologist:  Former patient of Dr. Sherryl Barters - to establish with Dr. Oval Linsey going forward.    Chief Complaint  Patient presents with  . Hypertension    Follow up visit - former patient of Dr. Sherryl Barters    History of Present Illness: Thomas Li is a 75 y.o. male who presents today for a follow up visit. Former patient of Dr. Sherryl Barters.   He has HLD, HTN, BPH and LBBB.  He does not have any history of ischemic heart disease. He had a normal Cardiolite stress test in 2005.   Last seen back in November and felt to be stable from our standpoint.  Comes back today. Wife to see today as well. He says his BP is ok - but using a wrist cuff at home. Variable readings at home. He is on no medicines. No chest pain. Not short of breath. Says he could do better with his weight. Not really exercising as much as he should. Has had prostate biopsy - waiting on results.   Past Medical History  Diagnosis Date  . Hypertension     BORDERLINE  . Hypercholesterolemia   . Nocturnal polyuria   . Plantar fasciitis     No past surgical history on file.   Medications: Current Outpatient Prescriptions  Medication Sig Dispense Refill  . bimatoprost (LUMIGAN) 0.03 % ophthalmic solution     . Carboxymethylcellul-Glycerin (OPTIVE) 0.5-0.9 % SOLN Apply to eye.    Marland Kitchen co-enzyme Q-10 30 MG capsule Take by mouth.    . Coenzyme Q10 (CO Q 10 PO) Take 1 tablet by mouth 2 (two) times daily.     Marland Kitchen latanoprost (XALATAN) 0.005 % ophthalmic solution Place 1 drop into both eyes at bedtime.  0  . Multiple Vitamin (MULTIVITAMIN) tablet Take 1 tablet by mouth daily.      . Multiple Vitamins-Minerals (PRESERVISION AREDS 2) CAPS Take by mouth.    . nystatin-triamcinolone (MYCOLOG II) cream apply to affected area four times a day 30 g 1  . oxymetazoline  (AFRIN NASAL SPRAY) 0.05 % nasal spray Place into the nose.    . tamsulosin (FLOMAX) 0.4 MG CAPS capsule Take 0.4 mg by mouth at bedtime.    . sildenafil (VIAGRA) 50 MG tablet Take 1 tablet (50 mg total) by mouth daily as needed for erectile dysfunction. 6 tablet 5   No current facility-administered medications for this visit.    Allergies: No Known Allergies  Social History: The patient  reports that he quit smoking about 47 years ago. He does not have any smokeless tobacco history on file. He reports that he does not drink alcohol or use illicit drugs.   Family History: The patient's family history includes Heart disease (age of onset: 56) in his father.   Review of Systems: Please see the history of present illness.   Otherwise, the review of systems is positive for none.   All other systems are reviewed and negative.   Physical Exam: VS:  BP 180/100 mmHg  Pulse 88  Ht 5\' 6"  (1.676 m)  Wt 179 lb 1.9 oz (81.248 kg)  BMI 28.92 kg/m2 .  BMI Body mass index is 28.92 kg/(m^2).  Wt Readings from Last 3 Encounters:  06/07/15 179 lb 1.9 oz (81.248 kg)  11/30/14 175 lb 6.4 oz (79.561 kg)  03/07/14 178  lb (80.74 kg)   BP recheck by me is 170/100  General: Pleasant. Well developed, well nourished and in no acute distress.  HEENT: Normal. Neck: Supple, no JVD, carotid bruits, or masses noted.  Cardiac: Regular rate and rhythm. Soft outflow murmur. No edema.  Respiratory:  Lungs are clear to auscultation bilaterally with normal work of breathing.  GI: Soft and nontender.  MS: No deformity or atrophy. Gait and ROM intact. Skin: Warm and dry. Color is normal.  Neuro:  Strength and sensation are intact and no gross focal deficits noted.  Psych: Alert, appropriate and with normal affect.   LABORATORY DATA:  EKG:  EKG is not ordered today.  Lab Results  Component Value Date   WBC 4.9 05/09/2010   HGB 16.3 05/09/2010   HCT 48.1 05/09/2010   PLT 179.0 05/09/2010   GLUCOSE 111*  11/21/2014   CHOL 182 11/21/2014   TRIG 127 11/21/2014   HDL 43 11/21/2014   LDLDIRECT 140.4 09/01/2013   LDLCALC 114 11/21/2014   ALT 31 11/21/2014   AST 23 11/21/2014   NA 141 11/21/2014   K 4.3 11/21/2014   CL 105 11/21/2014   CREATININE 0.90 11/21/2014   BUN 14 11/21/2014   CO2 26 11/21/2014   PSA 4.15* 11/21/2014    BNP (last 3 results) No results for input(s): BNP in the last 8760 hours.  ProBNP (last 3 results) No results for input(s): PROBNP in the last 8760 hours.   Other Studies Reviewed Today:   Assessment/Plan: 1. Essential hypertension - uncontrolled - not wanting to start medicine yet - wants to check at home - will call with update in one week - I suspect he will need antihypertensive medicine.   2. Hypercholesterolemia - lab today.   3. BPH - followed by [facility] - has had recent prostate biopsy due to higher PSA  4. LBBB - chronic  5. Murmur - echo ordered.   Current medicines are reviewed with the patient today.  The patient does not have concerns regarding medicines other than what has been noted above.  The following changes have been made:  See above.  Labs/ tests ordered today include:    Orders Placed This Encounter  Procedures  . Basic metabolic panel  . Hepatic function panel  . Lipid panel  . ECHOCARDIOGRAM COMPLETE     Disposition:   FU with me in 4 to 6 weeks; see Dr. Oval Linsey in 6 months.    Patient is agreeable to this plan and will call if any problems develop in the interim.   Signed: Burtis Junes, RN, ANP-C 06/07/2015 8:22 AM  [facility] [address] [place], [place]  [address] Phone: [phone] Fax: [phone]

## 2015-06-07 NOTE — Addendum Note (Signed)
Addended by: Burtis Junes on: 06/07/2015 08:37 AM   Modules accepted: Miquel Dunn

## 2015-06-07 NOTE — Patient Instructions (Addendum)
We will be checking the following labs today - BMET, Lipids and HPF   Medication Instructions:    Continue with your current medicines.     Testing/Procedures To Be Arranged:  Echocardiogram  Follow-Up:   See me in 4 to 6 weeks  See Dr. Oval Linsey in 6 months    Other Special Instructions:   Work on your [employer]  New BP cuff - start checking and call with an update in one week  Ok to use Nasocort or Flonase spray for cold symptoms    If you need a refill on your cardiac medications before your next appointment, please call your pharmacy.   Call the [place] office at [phone] if you have any questions, problems or concerns.

## 2015-06-10 ENCOUNTER — Ambulatory Visit (HOSPITAL_COMMUNITY): Payer: Medicare Other

## 2015-06-25 ENCOUNTER — Ambulatory Visit (HOSPITAL_COMMUNITY): Payer: Medicare Other | Attending: Cardiovascular Disease

## 2015-06-25 ENCOUNTER — Other Ambulatory Visit: Payer: Self-pay

## 2015-06-25 DIAGNOSIS — E7800 Pure hypercholesterolemia, unspecified: Secondary | ICD-10-CM | POA: Insufficient documentation

## 2015-06-25 DIAGNOSIS — I340 Nonrheumatic mitral (valve) insufficiency: Secondary | ICD-10-CM | POA: Insufficient documentation

## 2015-06-25 DIAGNOSIS — I10 Essential (primary) hypertension: Secondary | ICD-10-CM | POA: Diagnosis not present

## 2015-06-25 DIAGNOSIS — I119 Hypertensive heart disease without heart failure: Secondary | ICD-10-CM | POA: Diagnosis not present

## 2015-06-25 DIAGNOSIS — I447 Left bundle-branch block, unspecified: Secondary | ICD-10-CM | POA: Insufficient documentation

## 2015-06-25 DIAGNOSIS — R011 Cardiac murmur, unspecified: Secondary | ICD-10-CM | POA: Insufficient documentation

## 2015-06-25 DIAGNOSIS — Z87891 Personal history of nicotine dependence: Secondary | ICD-10-CM | POA: Diagnosis not present

## 2015-07-15 NOTE — Progress Notes (Signed)
Cardiology Office Note   Date:  07/18/2015   ID:  Thomas Li, DOB February 17, 1940, MRN [id number]  PCP:  Coletta Memos, PA-C  Cardiologist:   Skeet Latch, MD   Chief Complaint  Patient presents with  . New Patient (Initial Visit)    Pt states no Sx    History of Present Illness: Thomas Li is a 75 y.o. male with hypertension, hyperlipidemia, and LBBB who presents for follow up.  Thomas Li was previously a patient of Dr. Mare Ferrari.  He last saw Dr. Mare Ferrari on 11/2014, at which time he was doing well.  He had a normal nuclear stress test in 2005.  He followed up with Thomas Li on 05/2014, at which time his BP was 180/100.  He was noted to have a murmur on exam and and referred for an echo.  Echo on 06/25/15 revealed LVEF   40-45% with mild MR.  Thomas Li recommended that he start lisinopril 5 mg daily, but Thomas Li preferred to follow up in clinic prior to starting any new medications.  He reports that he has been feeling very well.  He denies chest pain or shortness of breath. He also has not noted any lower extremity edema, orthopnea or PND. He checks his blood pressure at home and it typically is around [id number] systolic evening after he has eaten dinner and relaxed he does not check it throughout the day.  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  H is 30 e denies any exertional symptoms with this activity.  Past Medical History  Diagnosis Date  . Hypercholesterolemia   . Nocturnal polyuria   . Plantar fasciitis   . Hypertension   . 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   Current Outpatient Prescriptions  Medication Sig Dispense Refill  . bimatoprost (LUMIGAN) 0.03 % ophthalmic solution     . Carboxymethylcellul-Glycerin (OPTIVE) 0.5-0.9 % SOLN Apply to eye.    Marland Kitchen co-enzyme Q-10 30 MG capsule Take by mouth.    . Coenzyme Q10  (CO Q 10 PO) Take 1 tablet by mouth 2 (two) times daily.     Marland Kitchen latanoprost (XALATAN) 0.005 % ophthalmic solution Place 1 drop into both eyes at bedtime.  0  . Multiple Vitamin (MULTIVITAMIN) tablet Take 1 tablet by mouth daily.      . Multiple Vitamins-Minerals (PRESERVISION AREDS 2) CAPS Take by mouth.    . nystatin-triamcinolone (MYCOLOG II) cream apply to affected area four times a day 30 g 1  . oxymetazoline (AFRIN NASAL SPRAY) 0.05 % nasal spray Place into the nose.    . tamsulosin (FLOMAX) 0.4 MG CAPS capsule Take 0.4 mg by mouth at bedtime.    Marland Kitchen lisinopril (PRINIVIL,ZESTRIL) 10 MG tablet Take 1 tablet (10 mg total) by mouth daily. 90 tablet 1   No current facility-administered medications for this visit.    Allergies:   Review of patient's allergies indicates no known allergies.    Social History:  The patient  reports that he quit smoking about 47 years ago. He does not have any smokeless tobacco history on file. He reports that he does not drink alcohol or use illicit drugs.   Family History:  The patient's family history includes Heart disease (age of onset: 32) in his father.    ROS:  Please see the history of present illness.  Otherwise, review of systems are positive for none.   All other systems are reviewed and negative.    PHYSICAL EXAM: VS:  BP 184/101 mmHg  Pulse 93  Ht 5\' 6"  (1.676 m)  Wt 176 lb 6.4 oz (80.015 kg)  BMI 28.49 kg/m2 , BMI Body mass index is 28.49 kg/(m^2). GENERAL:  Well appearing HEENT:  Pupils equal round and reactive, fundi not visualized, oral mucosa unremarkable NECK:  No jugular venous distention, waveform within normal limits, carotid upstroke brisk and symmetric, no bruits, no thyromegaly LYMPHATICS:  No cervical adenopathy LUNGS:  Clear to auscultation bilaterally HEART:  RRR.  PMI not displaced or sustained,S1 and S2 within normal limits, no S3, no S4, no clicks, no rubs, no murmurs ABD:  Flat, positive bowel sounds normal in frequency  in pitch, no bruits, no rebound, no guarding, no midline pulsatile mass, no hepatomegaly, no splenomegaly EXT:  2 plus pulses throughout, no edema, no cyanosis no clubbing SKIN:  No rashes no nodules NEURO:  Cranial nerves II through XII grossly intact, motor grossly intact throughout PSYCH:  Cognitively intact, oriented to person place and time    EKG:  EKG is ordered today. The ekg ordered today demonstrates sinus rhythm. Rate 93 bpm. Left bundle branch block.  Echo 06/25/15: Study Conclusions  - Left ventricle: Diffuse hypokinesis worse in the septum and apex.  The cavity size was moderately dilated. Wall thickness was  normal. Systolic function was mildly to moderately reduced. The  estimated ejection fraction was in the range of 40% to 45%. Wall  motion was normal; there were no regional wall motion  abnormalities. Grade 1 diastolic dysfunction. - Mitral valve: There was mild regurgitation. - Atrial septum: No defect or patent foramen ovale was identified  Recent Labs: 06/07/2015: ALT 38; BUN 11; Creat 0.93; Potassium 4.0; Sodium 140    Lipid Panel    Component Value Date/Time   CHOL 192 06/07/2015 0841   TRIG 280* 06/07/2015 0841   HDL 39* 06/07/2015 0841   CHOLHDL 4.9 06/07/2015 0841   VLDL 56* 06/07/2015 0841   LDLCALC 97 06/07/2015 0841   LDLDIRECT 140.4 09/01/2013 0738      Wt Readings from Last 3 Encounters:  07/18/15 176 lb 6.4 oz (80.015 kg)  06/07/15 179 lb 1.9 oz (81.248 kg)  11/30/14 175 lb 6.4 oz (79.561 kg)      ASSESSMENT AND PLAN:  # Chronic systolic and diastolic heart failure:  Thomas Li was noted to have a reduced EF on his recent echo.  On my independent review of Thomas Li echo, it appears that he has grade 1 diastolic dysfunction in addition to systolic heart failure. He has no symptoms of heart failure and does not have evidence of heart failure on exam. It is likely that this is due to poorly controlled hypertension. There were no  focal wall motion abnormalities. However, we will refer him for suicide Myoview to evaluate for ischemia. He has a left bundle branch block and requires perfusion imaging.  We will start lisinopril 10 mg daily.  # Hypertensive heart disease: BP is elevated again today.  He reports that his blood pressures are only mildly elevated at home. He has been somewhat reluctant to start antihypertensives so we will start with a low dose of lisinopril 10 mg daily. He will continue to check his blood pressures at home.  When he comes back for his stress test in about a week he will also have a basic metabolic panel checked.  #  Obesity: We stressed the importance of exercising at least 30-40 minutes most days of the week. He expressed understanding.   Current medicines are reviewed at length with the patient today.  The patient does not have concerns regarding medicines.  The following changes have been made:  Start lisinopril 10 mg daily.   Labs/ tests ordered today include:   Orders Placed This Encounter  Procedures  . Basic metabolic panel  . Myocardial Perfusion Imaging  . EKG 12-Lead     Disposition:   FU with Peg Fifer C. Oval Linsey, MD, [facility] in 1 month.     This note was written with the assistance of speech recognition software.  Please excuse any transcriptional errors.  Signed, Xcaret Morad C. Oval Linsey, MD, [facility]  07/18/2015 10:45 AM    [place]

## 2015-07-16 ENCOUNTER — Ambulatory Visit: Payer: Medicare Other | Admitting: Nurse Practitioner

## 2015-07-17 DIAGNOSIS — D485 Neoplasm of uncertain behavior of skin: Secondary | ICD-10-CM | POA: Diagnosis not present

## 2015-07-17 DIAGNOSIS — L570 Actinic keratosis: Secondary | ICD-10-CM | POA: Diagnosis not present

## 2015-07-17 DIAGNOSIS — L290 Pruritus ani: Secondary | ICD-10-CM | POA: Diagnosis not present

## 2015-07-18 ENCOUNTER — Encounter: Payer: Self-pay | Admitting: Cardiovascular Disease

## 2015-07-18 ENCOUNTER — Ambulatory Visit (INDEPENDENT_AMBULATORY_CARE_PROVIDER_SITE_OTHER): Payer: Medicare Other | Admitting: Cardiovascular Disease

## 2015-07-18 VITALS — BP 184/101 | HR 93 | Ht 66.0 in | Wt 176.4 lb

## 2015-07-18 DIAGNOSIS — I110 Hypertensive heart disease with heart failure: Secondary | ICD-10-CM | POA: Diagnosis not present

## 2015-07-18 DIAGNOSIS — I5042 Chronic combined systolic (congestive) and diastolic (congestive) heart failure: Secondary | ICD-10-CM | POA: Insufficient documentation

## 2015-07-18 DIAGNOSIS — I5021 Acute systolic (congestive) heart failure: Secondary | ICD-10-CM | POA: Diagnosis not present

## 2015-07-18 DIAGNOSIS — I447 Left bundle-branch block, unspecified: Secondary | ICD-10-CM

## 2015-07-18 DIAGNOSIS — E669 Obesity, unspecified: Secondary | ICD-10-CM

## 2015-07-18 DIAGNOSIS — E785 Hyperlipidemia, unspecified: Secondary | ICD-10-CM | POA: Diagnosis not present

## 2015-07-18 DIAGNOSIS — Z79899 Other long term (current) drug therapy: Secondary | ICD-10-CM | POA: Diagnosis not present

## 2015-07-18 HISTORY — DX: Chronic combined systolic (congestive) and diastolic (congestive) heart failure: I50.42

## 2015-07-18 MED ORDER — LISINOPRIL 10 MG PO TABS: 10.0000 mg | ORAL_TABLET | Freq: Every day | ORAL | Status: DC

## 2015-07-18 NOTE — Patient Instructions (Signed)
Medication Instructions:  START LISINOPRIL 10 MG DAILY   Labwork: BMET AT [facility] ON THE FIRST FLOOR WHEN YOU RETURN FOR YOUR STRESS TEST  Testing/Procedures: Your physician has requested that you have en exercise stress myoview. For further information please visit [email or web address]. Please follow instruction sheet, as given.  Follow-Up: Your physician recommends that you schedule a follow-up appointment in: [place]  If you need a refill on your cardiac medications before your next appointment, please call your pharmacy.

## 2015-07-24 ENCOUNTER — Telehealth (HOSPITAL_COMMUNITY): Payer: Self-pay

## 2015-07-24 NOTE — Telephone Encounter (Signed)
Encounter complete. 

## 2015-07-25 ENCOUNTER — Ambulatory Visit (HOSPITAL_COMMUNITY)
Admission: RE | Admit: 2015-07-25 | Discharge: 2015-07-25 | Disposition: A | Discharge status: Home / Self Care | Payer: Medicare Other | Source: Ambulatory Visit | Attending: Cardiology | Admitting: Cardiology

## 2015-07-25 DIAGNOSIS — I110 Hypertensive heart disease with heart failure: Secondary | ICD-10-CM | POA: Insufficient documentation

## 2015-07-25 DIAGNOSIS — I5021 Acute systolic (congestive) heart failure: Secondary | ICD-10-CM | POA: Diagnosis not present

## 2015-07-25 DIAGNOSIS — E669 Obesity, unspecified: Secondary | ICD-10-CM | POA: Diagnosis not present

## 2015-07-25 DIAGNOSIS — I447 Left bundle-branch block, unspecified: Secondary | ICD-10-CM | POA: Diagnosis not present

## 2015-07-25 DIAGNOSIS — Z6828 Body mass index (BMI) 28.0-28.9, adult: Secondary | ICD-10-CM | POA: Insufficient documentation

## 2015-07-25 DIAGNOSIS — Z87891 Personal history of nicotine dependence: Secondary | ICD-10-CM | POA: Insufficient documentation

## 2015-07-25 DIAGNOSIS — R9439 Abnormal result of other cardiovascular function study: Secondary | ICD-10-CM | POA: Insufficient documentation

## 2015-07-25 DIAGNOSIS — Z8249 Family history of ischemic heart disease and other diseases of the circulatory system: Secondary | ICD-10-CM | POA: Insufficient documentation

## 2015-07-25 DIAGNOSIS — Z79899 Other long term (current) drug therapy: Secondary | ICD-10-CM | POA: Diagnosis not present

## 2015-07-25 LAB — MYOCARDIAL PERFUSION IMAGING
CHL CUP NUCLEAR SDS: 3
LV sys vol: 49 mL
LVDIAVOL: 93 mL (ref 62–150)
NUC STRESS TID: 1.06
Peak HR: 95 {beats}/min
Rest HR: 69 {beats}/min
SRS: 3
SSS: 6

## 2015-07-25 MED ORDER — TECHNETIUM TC 99M TETROFOSMIN IV KIT
30.5000 | PACK | Freq: Once | INTRAVENOUS | Status: AC | PRN
  Administered 2015-07-25: 31 via INTRAVENOUS
  Filled 2015-07-25: qty 31

## 2015-07-25 MED ORDER — TECHNETIUM TC 99M TETROFOSMIN IV KIT
10.2000 | PACK | Freq: Once | INTRAVENOUS | Status: AC | PRN
  Administered 2015-07-25: 10 via INTRAVENOUS
  Filled 2015-07-25: qty 10

## 2015-07-25 MED ORDER — REGADENOSON 0.4 MG/5ML IV SOLN
0.4000 mg | Freq: Once | INTRAVENOUS | Status: AC
  Administered 2015-07-25: 0.4 mg via INTRAVENOUS

## 2015-07-26 LAB — BASIC METABOLIC PANEL
BUN: 13 mg/dL (ref 7–25)
CALCIUM: 9.4 mg/dL (ref 8.6–10.3)
CO2: 26 mmol/L (ref 20–31)
Chloride: 103 mmol/L (ref 98–110)
Creat: 0.86 mg/dL (ref 0.70–1.18)
Glucose, Bld: 100 mg/dL — ABNORMAL HIGH (ref 65–99)
POTASSIUM: 4.4 mmol/L (ref 3.5–5.3)
SODIUM: 139 mmol/L (ref 135–146)

## 2015-07-29 ENCOUNTER — Telehealth: Payer: Self-pay | Admitting: *Deleted

## 2015-07-29 NOTE — Telephone Encounter (Signed)
-----   Message from Skeet Latch, MD sent at 07/28/2015 11:39 PM EDT ----- Stress test was mildly abnormal.  However, this is likely because of his LBBB.  Keep scheduled follow up to discuss.

## 2015-07-29 NOTE — Telephone Encounter (Signed)
Spoke to patient. Result given . Verbalized understanding  

## 2015-09-03 ENCOUNTER — Ambulatory Visit: Payer: Medicare Other | Admitting: Cardiovascular Disease

## 2015-09-03 NOTE — Progress Notes (Signed)
Cardiology Office Note   Date:  09/04/2015   ID:  Thomas [name], DOB [birth year], MRN [id number]  PCP:  Thomas Noon, MD  Cardiologist:   Thomas Latch, MD   Chief Complaint  Patient presents with  . Follow-up    History of Present Illness: Thomas [name] is a 75 y.o. male with hypertension, hyperlipidemia, and LBBB who presents for follow up.  Thomas [name] was previously a patient of Dr. Mare [name]  He last saw Dr. Mare [name] on 11/2014, at which time he was doing [name]  He had a normal nuclear stress test in 2005.  He followed up with Thomas [name] on 05/2014, at which time his BP was 180/100.  He was noted to have a murmur on exam and and referred for an echo.  Echo on 06/25/15 revealed LVEF 40-45% with mild MR. At his last appointment 06/2015 he was started on lisinopril 10 mg daily. He had a Lexiscan Myoview that revealed LVEF 47% with a moderate, partially reversible anteroseptal perfusion defect is felt to be due to either scar and left bundle branch block. There was no ischemia.  Thomas [name]  He hasn't been exercising but denies chest pain, shortness of breath, lower extremity edema, orthopnea or PND.  He has tolerated lisinopril [name]  His blood pressure at home has been [phone]/60-80.  Past Medical History:  Diagnosis Date  . 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 Hypercholesterolemia   . Hypertension   . Nocturnal polyuria   . Plantar fasciitis     No past surgical history on file.   Current Outpatient Prescriptions  Medication Sig Dispense Refill  . bimatoprost (LUMIGAN) 0.03 % ophthalmic solution Place 1 drop into both eyes.     . Carboxymethylcellul-Glycerin (OPTIVE) 0.5-0.9 % SOLN Apply 1 drop to eye daily.     . Coenzyme Q10 (CO Q-10) 200 MG CAPS Take 200 mg by mouth daily.    Marland Kitchen latanoprost (XALATAN) 0.005 % ophthalmic solution Place 1 drop into both eyes at bedtime.  0  . lisinopril (PRINIVIL,ZESTRIL)  10 MG tablet Take 1 tablet (10 mg total) by mouth daily. 90 tablet 1  . Multiple Vitamins-Minerals (PRESERVISION AREDS 2) CAPS Take by mouth.    . nystatin-triamcinolone (MYCOLOG II) cream apply to affected area four times a day 30 g 1  . oxymetazoline (AFRIN NASAL SPRAY) 0.05 % nasal spray Place into the nose.    . tamsulosin (FLOMAX) 0.4 MG CAPS capsule Take 0.4 mg by mouth daily.      No current facility-administered medications for this visit.     Allergies:   Review of patient's allergies indicates no known allergies.    Social History:  The patient  reports that he quit smoking about 47 years ago. He does not have any smokeless tobacco history on file. He reports that he does not drink alcohol or use drugs.   Family History:  The patient's family history includes Heart disease (age of onset: 55) in his father.    ROS:  Please see the history of present illness.   Otherwise, review of systems are positive for none.   All other systems are reviewed and negative.    PHYSICAL EXAM: VS:  BP 130/70 (BP Location: Right Arm, Patient Position: Sitting, Cuff Size: Normal)   Pulse 92   Ht 5\' 6"  (1.676 m)   Wt 178 lb 6.4 oz (80.9 kg)   SpO2 97%   BMI 28.79  kg/m  , BMI Body mass index is 28.79 kg/m. GENERAL:  [name] appearing HEENT:  Pupils equal round and reactive, fundi not visualized, oral mucosa unremarkable NECK:  No jugular venous distention, waveform within normal limits, carotid upstroke brisk and symmetric, no bruits, no thyromegaly LYMPHATICS:  No cervical adenopathy LUNGS:  Clear to auscultation bilaterally HEART:  RRR.  PMI not displaced or sustained,S1 and S2 within normal limits, no S3, no S4, no clicks, no rubs, no murmurs ABD:  Flat, positive bowel sounds normal in frequency in pitch, no bruits, no rebound, no guarding, no midline pulsatile mass, no hepatomegaly, no splenomegaly EXT:  2 plus pulses throughout, no edema, no cyanosis no clubbing SKIN:  No rashes no  nodules NEURO:  Cranial nerves II through XII grossly intact, motor grossly intact throughout PSYCH:  Cognitively intact, oriented to person place and time   EKG:  EKG is ordered today. The ekg ordered today demonstrates sinus rhythm. Rate 93 bpm. Left bundle branch block.  Echo 06/25/15: Study Conclusions  - Left ventricle: Diffuse hypokinesis worse in the septum and apex.  The cavity size was moderately dilated. Wall thickness was  normal. Systolic function was mildly to moderately reduced. The  estimated ejection fraction was in the range of 40% to 45%. Wall  motion was normal; there were no regional wall motion  abnormalities. Grade 1 diastolic dysfunction. - Mitral valve: There was mild regurgitation. - Atrial septum: No defect or patent foramen ovale was identified  Recent Labs: 06/07/2015: ALT 38 07/25/2015: BUN 13; Creat 0.86; Potassium 4.4; Sodium 139    Lipid Panel    Component Value Date/Time   CHOL 192 06/07/2015 0841   TRIG 280 (H) 06/07/2015 0841   HDL 39 (L) 06/07/2015 0841   CHOLHDL 4.9 06/07/2015 0841   VLDL 56 (H) 06/07/2015 0841   LDLCALC 97 06/07/2015 0841   LDLDIRECT 140.4 09/01/2013 0738      Wt Readings from Last 3 Encounters:  09/04/15 178 lb 6.4 oz (80.9 kg)  07/25/15 176 lb (79.8 kg)  07/18/15 176 lb 6.4 oz (80 kg)      ASSESSMENT AND PLAN:  # Chronic systolic and diastolic heart failure:  LVEF 40-45%.  He is euvolemic and asymptomatic.  He really does not want to take another medication.  For now we will continue lisinopril and repeat his echo in 3 months.  If his LVEF is stable or improved we will not add a beta blocker.  If it is worse, he will be OK with adding metoprolol.  Lexiscan Myoview was negative for ischemia.   # Hypertensive heart disease: BP is [name].   # Obesity: We stressed the importance of exercising at least 30-40 minutes most days of the week. He expressed understanding.   Current medicines are reviewed at  length with the patient today.  The patient does not have concerns regarding medicines.  The following changes have been made:  None  Labs/ tests ordered today include:   Orders Placed This Encounter  Procedures  . ECHOCARDIOGRAM COMPLETE     Disposition:   FU with Thomas [name] C. Oval Linsey, MD, [facility] in 6 months   This note was written with the assistance of speech recognition software.  Please excuse any transcriptional errors.  Signed, Geeta Dworkin C. Oval Linsey, MD, [facility]  09/04/2015 9:35 AM    [facility]

## 2015-09-04 ENCOUNTER — Encounter (INDEPENDENT_AMBULATORY_CARE_PROVIDER_SITE_OTHER): Payer: Self-pay

## 2015-09-04 ENCOUNTER — Ambulatory Visit (INDEPENDENT_AMBULATORY_CARE_PROVIDER_SITE_OTHER): Payer: Medicare Other | Admitting: Cardiovascular Disease

## 2015-09-04 ENCOUNTER — Encounter: Payer: Self-pay | Admitting: Cardiovascular Disease

## 2015-09-04 VITALS — BP 130/70 | HR 92 | Ht 66.0 in | Wt 178.4 lb

## 2015-09-04 DIAGNOSIS — I10 Essential (primary) hypertension: Secondary | ICD-10-CM

## 2015-09-04 DIAGNOSIS — I5042 Chronic combined systolic (congestive) and diastolic (congestive) heart failure: Secondary | ICD-10-CM

## 2015-09-04 NOTE — Patient Instructions (Addendum)
Medication Instructions:  Your physician recommends that you continue on your current medications as directed. Please refer to the Current Medication list given to you today.  Labwork: none  Testing/Procedures: Your physician has requested that you have an echocardiogram. Echocardiography is a painless test that uses sound waves to create images of your heart. It provides your doctor with information about the size and shape of your heart and how well your heart's chambers and valves are working. This procedure takes approximately one hour. There are no restrictions for this procedure. Around November  At the [facility] office   Follow-Up: Your physician wants you to follow-up in: 6 month ov You will receive a reminder letter in the mail two months in advance. If you don't receive a letter, please call our office to schedule the follow-up appointment.  If you need a refill on your cardiac medications before your next appointment, please call your pharmacy.

## 2015-09-05 DIAGNOSIS — H402221 Chronic angle-closure glaucoma, left eye, mild stage: Secondary | ICD-10-CM | POA: Diagnosis not present

## 2015-09-05 DIAGNOSIS — H402211 Chronic angle-closure glaucoma, right eye, mild stage: Secondary | ICD-10-CM | POA: Diagnosis not present

## 2015-09-05 DIAGNOSIS — H2513 Age-related nuclear cataract, bilateral: Secondary | ICD-10-CM | POA: Diagnosis not present

## 2015-09-05 DIAGNOSIS — H35373 Puckering of macula, bilateral: Secondary | ICD-10-CM | POA: Diagnosis not present

## 2015-09-11 DIAGNOSIS — L219 Seborrheic dermatitis, unspecified: Secondary | ICD-10-CM | POA: Diagnosis not present

## 2015-09-11 DIAGNOSIS — L570 Actinic keratosis: Secondary | ICD-10-CM | POA: Diagnosis not present

## 2015-09-11 DIAGNOSIS — L290 Pruritus ani: Secondary | ICD-10-CM | POA: Diagnosis not present

## 2015-10-02 DIAGNOSIS — Z23 Encounter for immunization: Secondary | ICD-10-CM | POA: Diagnosis not present

## 2015-12-04 DIAGNOSIS — N401 Enlarged prostate with lower urinary tract symptoms: Secondary | ICD-10-CM | POA: Diagnosis not present

## 2015-12-04 DIAGNOSIS — R350 Frequency of micturition: Secondary | ICD-10-CM | POA: Diagnosis not present

## 2015-12-04 DIAGNOSIS — R972 Elevated prostate specific antigen [PSA]: Secondary | ICD-10-CM | POA: Diagnosis not present

## 2015-12-05 ENCOUNTER — Other Ambulatory Visit: Payer: Self-pay

## 2015-12-05 ENCOUNTER — Ambulatory Visit (HOSPITAL_COMMUNITY): Payer: Medicare Other | Attending: Cardiology

## 2015-12-05 DIAGNOSIS — I110 Hypertensive heart disease with heart failure: Secondary | ICD-10-CM | POA: Insufficient documentation

## 2015-12-05 DIAGNOSIS — I340 Nonrheumatic mitral (valve) insufficiency: Secondary | ICD-10-CM | POA: Diagnosis not present

## 2015-12-05 DIAGNOSIS — I447 Left bundle-branch block, unspecified: Secondary | ICD-10-CM | POA: Insufficient documentation

## 2015-12-05 DIAGNOSIS — I5042 Chronic combined systolic (congestive) and diastolic (congestive) heart failure: Secondary | ICD-10-CM | POA: Insufficient documentation

## 2016-01-02 ENCOUNTER — Other Ambulatory Visit: Payer: Self-pay | Admitting: Cardiovascular Disease

## 2016-01-02 NOTE — Telephone Encounter (Signed)
Please review for refill. Thanks!  

## 2016-01-02 NOTE — Telephone Encounter (Signed)
Review for refill. 

## 2016-01-08 DIAGNOSIS — L821 Other seborrheic keratosis: Secondary | ICD-10-CM | POA: Diagnosis not present

## 2016-01-08 DIAGNOSIS — Z85828 Personal history of other malignant neoplasm of skin: Secondary | ICD-10-CM | POA: Diagnosis not present

## 2016-01-08 DIAGNOSIS — Z23 Encounter for immunization: Secondary | ICD-10-CM | POA: Diagnosis not present

## 2016-01-08 DIAGNOSIS — L570 Actinic keratosis: Secondary | ICD-10-CM | POA: Diagnosis not present

## 2016-01-08 DIAGNOSIS — L814 Other melanin hyperpigmentation: Secondary | ICD-10-CM | POA: Diagnosis not present

## 2016-01-08 DIAGNOSIS — D1801 Hemangioma of skin and subcutaneous tissue: Secondary | ICD-10-CM | POA: Diagnosis not present

## 2016-01-08 DIAGNOSIS — D225 Melanocytic nevi of trunk: Secondary | ICD-10-CM | POA: Diagnosis not present

## 2016-01-08 DIAGNOSIS — Z86018 Personal history of other benign neoplasm: Secondary | ICD-10-CM | POA: Diagnosis not present

## 2016-02-28 DIAGNOSIS — H04123 Dry eye syndrome of bilateral lacrimal glands: Secondary | ICD-10-CM | POA: Diagnosis not present

## 2016-02-28 DIAGNOSIS — H1013 Acute atopic conjunctivitis, bilateral: Secondary | ICD-10-CM | POA: Diagnosis not present

## 2016-02-28 DIAGNOSIS — H402231 Chronic angle-closure glaucoma, bilateral, mild stage: Secondary | ICD-10-CM | POA: Diagnosis not present

## 2016-05-06 ENCOUNTER — Encounter: Payer: Self-pay | Admitting: Cardiovascular Disease

## 2016-05-06 ENCOUNTER — Ambulatory Visit (INDEPENDENT_AMBULATORY_CARE_PROVIDER_SITE_OTHER): Payer: Medicare Other | Admitting: Cardiovascular Disease

## 2016-05-06 VITALS — BP 172/76 | HR 92 | Ht 66.0 in | Wt 177.0 lb

## 2016-05-06 DIAGNOSIS — E6609 Other obesity due to excess calories: Secondary | ICD-10-CM | POA: Diagnosis not present

## 2016-05-06 DIAGNOSIS — I10 Essential (primary) hypertension: Secondary | ICD-10-CM | POA: Diagnosis not present

## 2016-05-06 DIAGNOSIS — E785 Hyperlipidemia, unspecified: Secondary | ICD-10-CM

## 2016-05-06 DIAGNOSIS — I5042 Chronic combined systolic (congestive) and diastolic (congestive) heart failure: Secondary | ICD-10-CM

## 2016-05-06 MED ORDER — LOSARTAN POTASSIUM 25 MG PO TABS: 25.0000 mg | ORAL_TABLET | Freq: Every day | ORAL | 1 refills | Status: DC

## 2016-05-06 NOTE — Progress Notes (Signed)
Cardiology Office Note   Date:  05/06/2016   ID:  Thomas Li, DOB 10-Jul-1940, MRN [id number]  PCP:  Thomas Noon, MD  Cardiologist:   Thomas Latch, MD   Chief Complaint  Patient presents with  . Follow-up    no chest pain, shortness of breath, edema, pain or crampoing in legs, lightheaded or dizzines    History of Present Illness: Thomas Li is a 76 y.o. male with hypertension, hyperlipidemia, and LBBB who presents for follow up.  Thomas Li was previously a patient of Dr. Mare [name]  He last saw Dr. Mare Li on 11/2014, at which time he was doing well.  He had a normal nuclear stress test in 2005.  He followed up with Thomas Li on 05/2014, at which time his BP was 180/100.  He was noted to have a murmur on exam and and referred for an echo.  Echo on 06/25/15 revealed LVEF 40-45% with mild MR. On 06/2015 he was started on lisinopril 10 mg daily. He had a Lexiscan Myoview that revealed LVEF 47% with a moderate, partially reversible anteroseptal perfusion defect is felt to be due to either scar and left bundle branch block. There was no ischemia.  He wasn't interested in starting metoprolol.  He had a repeat echo 11/2015 that revealed LVEF 55-60%.  Since his last appointment Thomas Li has been doing well. He denies chest pain or shortness of breath.  He denies lower extremity edema, orthopnea, or PND. He doesn't get any formal exercise but is very active at work. He has no exertional symptoms. He checks his blood pressure home and it typically is in the 1 teens to 456Y systolic. He only checks in the evening. He has noted a dry cough as being on lisinopril. He denies fever or chills.   Past Medical History:  Diagnosis Date  . Chronic combined systolic and diastolic heart failure ([place]) 07/18/2015  . Hypercholesterolemia   . Hypertension   . Nocturnal polyuria   . Plantar fasciitis     No past surgical history on file.   Current Outpatient Prescriptions    Medication Sig Dispense Refill  . bimatoprost (LUMIGAN) 0.03 % ophthalmic solution Place 1 drop into both eyes.     . Carboxymethylcellul-Glycerin (OPTIVE) 0.5-0.9 % SOLN Apply 1 drop to eye daily.     . Coenzyme Q10 (CO Q-10) 200 MG CAPS Take 200 mg by mouth daily.    Marland Kitchen latanoprost (XALATAN) 0.005 % ophthalmic solution Place 1 drop into both eyes at bedtime.  0  . Multiple Vitamins-Minerals (PRESERVISION AREDS 2) CAPS Take by mouth.    . nystatin-triamcinolone (MYCOLOG II) cream apply to affected area four times a day 30 g 1  . oxymetazoline (AFRIN NASAL SPRAY) 0.05 % nasal spray Place into the nose.    . tamsulosin (FLOMAX) 0.4 MG CAPS capsule Take 0.4 mg by mouth daily.     Marland Kitchen losartan (COZAAR) 25 MG tablet Take 1 tablet (25 mg total) by mouth daily. 90 tablet 1   No current facility-administered medications for this visit.     Allergies:   Lisinopril    Social History:  The patient  reports that he quit smoking about 48 years ago. He has never used smokeless tobacco. He reports that he does not drink alcohol or use drugs.   Family History:  The patient's family history includes Heart disease (age of onset: 61) in his father.    ROS:  Please see the history of present  illness.   Otherwise, review of systems are positive for none.   All other systems are reviewed and negative.    PHYSICAL EXAM: VS:  BP (!) 172/76   Pulse 92   Ht 5\' 6"  (1.676 m)   Wt 80.3 kg (177 lb)   BMI 28.57 kg/m  , BMI Body mass index is 28.57 kg/m. GENERAL:  Well appearing HEENT:  Pupils equal round and reactive, fundi not visualized, oral mucosa unremarkable NECK:  No jugular venous distention, waveform within normal limits, carotid upstroke brisk and symmetric, no bruits LYMPHATICS:  No cervical adenopathy LUNGS:  Clear to auscultation bilaterally HEART:  RRR.  PMI not displaced or sustained,S1 and S2 within normal limits, no S3, no S4, no clicks, no rubs, no murmurs ABD:  Flat, positive bowel sounds  normal in frequency in pitch, no bruits, no rebound, no guarding, no midline pulsatile mass EXT:  2 plus pulses throughout, no edema, no cyanosis no clubbing SKIN:  No rashes no nodules NEURO:  Cranial nerves II through XII grossly intact, motor grossly intact throughout PSYCH:  Cognitively intact, oriented to person place and time   EKG:  EKG is ordered today. The ekg ordered 05/06/16 demonstrates sinus rhythm. Rate 92 bpm. Left bundle branch block.  Echo 06/25/15: Study Conclusions  - Left ventricle: Diffuse hypokinesis worse in the septum and apex.  The cavity size was moderately dilated. Wall thickness was  normal. Systolic function was mildly to moderately reduced. The  estimated ejection fraction was in the range of 40% to 45%. Wall  motion was normal; there were no regional wall motion  abnormalities. Grade 1 diastolic dysfunction. - Mitral valve: There was mild regurgitation. - Atrial septum: No defect or patent foramen ovale was identified   Echo 12/05/2015: Study Conclusions  - Left ventricle: The cavity size was normal. Wall thickness was   increased in a pattern of mild LVH. Systolic function was normal.   The estimated ejection fraction was in the range of 55% to 60%.   Septal bounce consistent with LBBB. Doppler parameters are   consistent with abnormal left ventricular relaxation (grade 1   diastolic dysfunction). - Aortic valve: There was no stenosis. - Mitral valve: There was trivial regurgitation. - Right ventricle: The cavity size was normal. Systolic function   was normal. - Pulmonary arteries: No complete TR doppler jet so unable to   estimate PA systolic pressure. - Inferior vena cava: The vessel was normal in size. The   respirophasic diameter changes were in the normal range (= 50%),   consistent with normal central venous pressure.  Recent Labs: 06/07/2015: ALT 38 07/25/2015: BUN 13; Creat 0.86; Potassium 4.4; Sodium 139    Lipid Panel      Component Value Date/Time   CHOL 192 06/07/2015 0841   TRIG 280 (H) 06/07/2015 0841   HDL 39 (L) 06/07/2015 0841   CHOLHDL 4.9 06/07/2015 0841   VLDL 56 (H) 06/07/2015 0841   LDLCALC 97 06/07/2015 0841   LDLDIRECT 140.4 09/01/2013 0738      Wt Readings from Last 3 Encounters:  05/06/16 80.3 kg (177 lb)  09/04/15 80.9 kg (178 lb 6.4 oz)  07/25/15 79.8 kg (176 lb)      ASSESSMENT AND PLAN:  # Chronic systolic and diastolic heart failure: # Hypertensive heart disease:    LVEF improved to 55-60%.  He is doing well and euvolemic.  He has a cough on lisinopril so we will switch this to losartan 25 mg daily.  His blood pressure was elevated today both initially and on repeat. I've asked him to keep a log of his blood pressures and bring it to his follow-up appointment.  # Obesity: We stressed the importance of exercising at least 30-40 minutes most days of the week. He expressed understanding and will start walking.  # CV Disease Prevention: Check lipids.  Current medicines are reviewed at length with the patient today.  The patient does not have concerns regarding medicines.  The following changes have been made:  None  Labs/ tests ordered today include:   Orders Placed This Encounter  Procedures  . Lipid panel  . Comprehensive metabolic panel  . EKG 12-Lead     Disposition:   FU with Monquie Fulgham C. Oval Linsey, MD, [facility] in 1 month   This note was written with the assistance of speech recognition software.  Please excuse any transcriptional errors.  Signed, Reise Gladney C. Oval Linsey, MD, [facility]  05/06/2016 12:06 PM    [place]

## 2016-05-06 NOTE — Patient Instructions (Signed)
Medication Instructions:  STOP LISINOPRIL  START LOSARTAN 25 MG DAILY   Labwork: FASTING LIPID EITHER AT FOLLOW UP OR BEFORE  Testing/Procedures: NONE  Follow-Up: Your physician recommends that you schedule a follow-up appointment in: [place] [facility]   If you need a refill on your cardiac medications before your next appointment, please call your pharmacy.

## 2016-06-18 ENCOUNTER — Ambulatory Visit: Payer: Medicare Other | Admitting: Cardiovascular Disease

## 2016-06-29 DIAGNOSIS — N401 Enlarged prostate with lower urinary tract symptoms: Secondary | ICD-10-CM | POA: Diagnosis not present

## 2016-06-29 DIAGNOSIS — R350 Frequency of micturition: Secondary | ICD-10-CM | POA: Diagnosis not present

## 2016-06-29 DIAGNOSIS — R972 Elevated prostate specific antigen [PSA]: Secondary | ICD-10-CM | POA: Diagnosis not present

## 2016-09-02 DIAGNOSIS — K648 Other hemorrhoids: Secondary | ICD-10-CM | POA: Diagnosis not present

## 2016-10-13 ENCOUNTER — Other Ambulatory Visit: Payer: Self-pay | Admitting: Cardiovascular Disease

## 2016-10-13 DIAGNOSIS — Z23 Encounter for immunization: Secondary | ICD-10-CM | POA: Diagnosis not present

## 2016-10-13 NOTE — Telephone Encounter (Signed)
Please review for refill. Thanks!  

## 2016-10-29 DIAGNOSIS — H402231 Chronic angle-closure glaucoma, bilateral, mild stage: Secondary | ICD-10-CM | POA: Diagnosis not present

## 2016-10-29 DIAGNOSIS — H2513 Age-related nuclear cataract, bilateral: Secondary | ICD-10-CM | POA: Diagnosis not present

## 2016-10-29 DIAGNOSIS — H25013 Cortical age-related cataract, bilateral: Secondary | ICD-10-CM | POA: Diagnosis not present

## 2016-10-29 DIAGNOSIS — H35373 Puckering of macula, bilateral: Secondary | ICD-10-CM | POA: Diagnosis not present

## 2016-12-28 DIAGNOSIS — R972 Elevated prostate specific antigen [PSA]: Secondary | ICD-10-CM | POA: Diagnosis not present

## 2017-01-06 DIAGNOSIS — R350 Frequency of micturition: Secondary | ICD-10-CM | POA: Diagnosis not present

## 2017-01-06 DIAGNOSIS — R972 Elevated prostate specific antigen [PSA]: Secondary | ICD-10-CM | POA: Diagnosis not present

## 2017-01-06 DIAGNOSIS — N401 Enlarged prostate with lower urinary tract symptoms: Secondary | ICD-10-CM | POA: Diagnosis not present

## 2017-01-21 DIAGNOSIS — L309 Dermatitis, unspecified: Secondary | ICD-10-CM | POA: Diagnosis not present

## 2017-01-21 DIAGNOSIS — Z23 Encounter for immunization: Secondary | ICD-10-CM | POA: Diagnosis not present

## 2017-01-21 DIAGNOSIS — D1801 Hemangioma of skin and subcutaneous tissue: Secondary | ICD-10-CM | POA: Diagnosis not present

## 2017-01-21 DIAGNOSIS — L570 Actinic keratosis: Secondary | ICD-10-CM | POA: Diagnosis not present

## 2017-01-21 DIAGNOSIS — D225 Melanocytic nevi of trunk: Secondary | ICD-10-CM | POA: Diagnosis not present

## 2017-01-21 DIAGNOSIS — L814 Other melanin hyperpigmentation: Secondary | ICD-10-CM | POA: Diagnosis not present

## 2017-01-21 DIAGNOSIS — Z86018 Personal history of other benign neoplasm: Secondary | ICD-10-CM | POA: Diagnosis not present

## 2017-01-21 DIAGNOSIS — L821 Other seborrheic keratosis: Secondary | ICD-10-CM | POA: Diagnosis not present

## 2017-01-21 DIAGNOSIS — Z85828 Personal history of other malignant neoplasm of skin: Secondary | ICD-10-CM | POA: Diagnosis not present

## 2017-02-03 DIAGNOSIS — R972 Elevated prostate specific antigen [PSA]: Secondary | ICD-10-CM | POA: Diagnosis not present

## 2017-04-26 DIAGNOSIS — H04203 Unspecified epiphora, bilateral lacrimal glands: Secondary | ICD-10-CM | POA: Diagnosis not present

## 2017-04-26 DIAGNOSIS — H1013 Acute atopic conjunctivitis, bilateral: Secondary | ICD-10-CM | POA: Diagnosis not present

## 2017-04-26 DIAGNOSIS — H04123 Dry eye syndrome of bilateral lacrimal glands: Secondary | ICD-10-CM | POA: Diagnosis not present

## 2017-04-26 DIAGNOSIS — H402231 Chronic angle-closure glaucoma, bilateral, mild stage: Secondary | ICD-10-CM | POA: Diagnosis not present

## 2017-05-05 IMAGING — NM NM MISC PROCEDURE
6 series · 36 of 36 positions shown · non-contrast
Comparison: none

[Series 1: wbr rest · 6.40mm/px · 6 of 64 frames shown]
[frame 6/64]
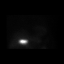
[frame 16/64]
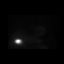
[frame 27/64]
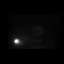
[frame 38/64]
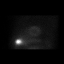
[frame 48/64]
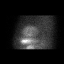
[frame 59/64]
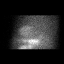

[Series 1: wbr_r-proj_st wbr rest · 6.40mm/px · 6 of 64 frames shown]
[frame 6/64]
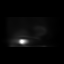
[frame 16/64]
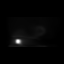
[frame 27/64]
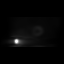
[frame 38/64]
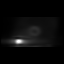
[frame 48/64]
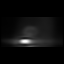
[frame 59/64]
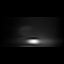

[Series 2: wbr_s-proj_st wbr stress-gsp · 6.40mm/px · 6 of 512 frames shown]
[frame 43/512]
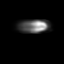
[frame 128/512]
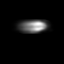
[frame 214/512]
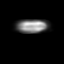
[frame 299/512]
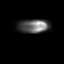
[frame 384/512]
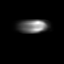
[frame 470/512]
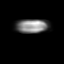

[Series 2: wbr stress-gsp · 6.40mm/px · 6 of 512 frames shown]
[frame 43/512]
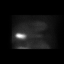
[frame 128/512]
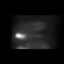
[frame 214/512]
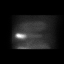
[frame 299/512]
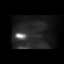
[frame 384/512]
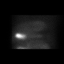
[frame 470/512]
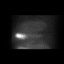

[Series 3: wbr_s-proj_st wbr stress-sum-em · 6.40mm/px · 6 of 64 frames shown]
[frame 6/64]
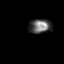
[frame 16/64]
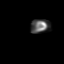
[frame 27/64]
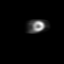
[frame 38/64]
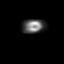
[frame 48/64]
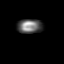
[frame 59/64]
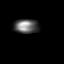

[Series 3: wbr stress-sum-em · 6.40mm/px · 6 of 64 frames shown]
[frame 6/64]
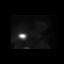
[frame 16/64]
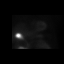
[frame 27/64]
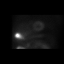
[frame 38/64]
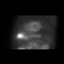
[frame 48/64]
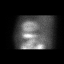
[frame 59/64]
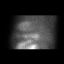

[36 of 36 positions shown; findings below may reference images not displayed]

Canned report from images found in remote index.

Refer to host system for actual result text.

## 2017-05-20 DIAGNOSIS — L309 Dermatitis, unspecified: Secondary | ICD-10-CM | POA: Diagnosis not present

## 2017-06-16 ENCOUNTER — Encounter: Payer: Self-pay | Admitting: Family Medicine

## 2017-06-16 ENCOUNTER — Ambulatory Visit (INDEPENDENT_AMBULATORY_CARE_PROVIDER_SITE_OTHER): Payer: Medicare Other | Admitting: Family Medicine

## 2017-06-16 VITALS — BP 138/84 | HR 94 | Temp 98.1°F | Ht 66.0 in | Wt 179.8 lb

## 2017-06-16 DIAGNOSIS — Z23 Encounter for immunization: Secondary | ICD-10-CM | POA: Diagnosis not present

## 2017-06-16 DIAGNOSIS — R7309 Other abnormal glucose: Secondary | ICD-10-CM | POA: Diagnosis not present

## 2017-06-16 DIAGNOSIS — I158 Other secondary hypertension: Secondary | ICD-10-CM | POA: Diagnosis not present

## 2017-06-16 DIAGNOSIS — I5042 Chronic combined systolic (congestive) and diastolic (congestive) heart failure: Secondary | ICD-10-CM

## 2017-06-16 DIAGNOSIS — E7800 Pure hypercholesterolemia, unspecified: Secondary | ICD-10-CM

## 2017-06-16 LAB — HEMOGLOBIN A1C: HEMOGLOBIN A1C: 6.1 % (ref 4.6–6.5)

## 2017-06-16 LAB — COMPREHENSIVE METABOLIC PANEL
ALT: 28 U/L (ref 0–53)
AST: 17 U/L (ref 0–37)
Albumin: 4.3 g/dL (ref 3.5–5.2)
Alkaline Phosphatase: 49 U/L (ref 39–117)
BILIRUBIN TOTAL: 0.4 mg/dL (ref 0.2–1.2)
BUN: 11 mg/dL (ref 6–23)
CALCIUM: 9.3 mg/dL (ref 8.4–10.5)
CO2: 30 mEq/L (ref 19–32)
Chloride: 104 mEq/L (ref 96–112)
Creatinine, Ser: 0.89 mg/dL (ref 0.40–1.50)
GFR: 88.41 mL/min (ref >60.00)
Glucose, Bld: 157 mg/dL — ABNORMAL HIGH (ref 70–99)
POTASSIUM: 4 meq/L (ref 3.5–5.1)
Sodium: 140 mEq/L (ref 135–145)
Total Protein: 6.9 g/dL (ref 6.0–8.3)

## 2017-06-16 LAB — LIPID PANEL
CHOLESTEROL: 180 mg/dL (ref 0–200)
HDL: 44.9 mg/dL (ref >39.00)
NonHDL: 134.85
Total CHOL/HDL Ratio: 4
Triglycerides: 300 mg/dL — ABNORMAL HIGH (ref 0.0–149.0)
VLDL: 60 mg/dL — ABNORMAL HIGH (ref 0.0–40.0)

## 2017-06-16 LAB — CBC WITH DIFFERENTIAL/PLATELET
BASOS PCT: 1.3 % (ref 0.0–3.0)
Basophils Absolute: 0.1 10*3/uL (ref 0.0–0.1)
EOS PCT: 2.7 % (ref 0.0–5.0)
Eosinophils Absolute: 0.1 10*3/uL (ref 0.0–0.7)
HCT: 45.9 % (ref 39.0–52.0)
HEMOGLOBIN: 15.5 g/dL (ref 13.0–17.0)
LYMPHS ABS: 1.1 10*3/uL (ref 0.7–4.0)
Lymphocytes Relative: 25.3 % (ref 12.0–46.0)
MCHC: 33.7 g/dL (ref 30.0–36.0)
MCV: 103.8 fl — AB (ref 78.0–100.0)
MONOS PCT: 12.4 % — AB (ref 3.0–12.0)
Monocytes Absolute: 0.5 10*3/uL (ref 0.1–1.0)
NEUTROS PCT: 58.3 % (ref 43.0–77.0)
Neutro Abs: 2.5 10*3/uL (ref 1.4–7.7)
Platelets: 235 10*3/uL (ref 150.0–400.0)
RBC: 4.43 Mil/uL (ref 4.22–5.81)
RDW: 13.5 % (ref 11.5–15.5)
WBC: 4.4 10*3/uL (ref 4.0–10.5)

## 2017-06-16 LAB — MICROALBUMIN / CREATININE URINE RATIO
CREATININE, U: 23 mg/dL
MICROALB/CREAT RATIO: 3 mg/g (ref 0.0–30.0)
Microalb, Ur: <0.7 mg/dL (ref 0.0–1.9)

## 2017-06-16 LAB — LDL CHOLESTEROL, DIRECT: Direct LDL: 105 mg/dL

## 2017-06-16 MED ORDER — PNEUMOCOCCAL 13-VAL CONJ VACC IM SUSP: 0.5000 mL | Freq: Once | INTRAMUSCULAR | Status: DC

## 2017-06-16 NOTE — Assessment & Plan Note (Signed)
euvolemic today. Blood pressure controlled. On ARB and followed by cardiology. Last echo showed improvement in his EF. He is overdue with appointment with cardiology and recommended he schedule this. Precautions given for any exacerbation of CHF.

## 2017-06-16 NOTE — Progress Notes (Signed)
Patient: Thomas Li MRN: [id number] DOB: 1940/09/06 PCP: Orma Flaming, MD     Subjective:  Chief Complaint  Patient presents with  . Establish Care    HPI: The patient is a 77 y.o. male who presents today to establish care and HTN.   Hypertension: Here for follow up of hypertension.  Currently on losartan 25mg  . Home readings range from 169-678 systolic/75-80diastolic. Takes medication as prescribed and denies any side effects. 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 Weight has been stable. Denies any chest pain, headaches, shortness of breath, vision changes, swelling in lower extremities.   Grade 1 diastolic dysfunction: followed by cardiology. Last echo was 11/2015 and EF wnl and grade 1 diastolic dysfunction. Followed by cardiology and on ARB only. Weight is euvolemic. He denies cough, orthopnea, SOB or leg swelling.   Hyperlipidemia: on no statin. No hx of CAD, does not smoke and no known hx of diabetes. He does have an elevated fasting sugar over a year ago.   Hx of elevated sugar over a year ago.    Review of Systems  Constitutional: Negative for chills, fatigue and fever.  HENT: Negative for dental problem, ear pain, hearing loss and trouble swallowing.   Eyes: Negative for visual disturbance.  Respiratory: Negative for cough, chest tightness and shortness of breath.   Cardiovascular: Negative for chest pain, palpitations and leg swelling.  Gastrointestinal: Negative for abdominal pain, blood in stool, diarrhea and nausea.  Endocrine: Negative for cold intolerance, polydipsia, polyphagia and polyuria.  Genitourinary: Negative for dysuria and hematuria.       Nocuturia  Musculoskeletal: Negative for arthralgias.  Skin: Negative.  Negative for rash.  Neurological: Negative for dizziness and headaches.  Psychiatric/Behavioral: Negative for dysphoric mood and sleep disturbance. The patient is not nervous/anxious.     Allergies Patient is  allergic to lisinopril.  Past Medical History Patient  has a past medical history of Chronic combined systolic and diastolic heart failure ([place]) (07/18/2015), Hypercholesterolemia, Hypertension, Nocturnal polyuria, and Plantar fasciitis.  Surgical History Patient  has no past surgical history on file.  Family History Pateint's family history includes Heart disease (age of onset: 72) in his father.  Social History Patient  reports that he quit smoking about 49 years ago. He has never used smokeless tobacco. He reports that he drinks alcohol. He reports that he does not use drugs.    Objective: Vitals:   06/16/17 0819  BP: 138/84  Pulse: 94  Temp: 98.1 F (36.7 C)  TempSrc: Oral  SpO2: 94%  Weight: 179 lb 12.8 oz (81.6 kg)  Height: 5\' 6"  (1.676 m)    Body mass index is 29.02 kg/m.  Physical Exam  Constitutional: He is oriented to person, place, and time. He appears well-developed and well-nourished.  HENT:  Right Ear: External ear normal.  Left Ear: External ear normal.  Mouth/Throat: Oropharynx is clear and moist.  Eyes: Pupils are equal, round, and reactive to light. Conjunctivae and EOM are normal.  Neck: Normal range of motion. Neck supple. No thyromegaly present.  Cardiovascular: Normal rate, regular rhythm and intact distal pulses.  Murmur heard. Pulmonary/Chest: Effort normal and breath sounds normal.  Abdominal: Soft. Bowel sounds are normal. He exhibits no distension. There is no tenderness.  Lymphadenopathy:    He has no cervical adenopathy.  Neurological: He is alert and oriented to person, place, and time. He displays normal reflexes. Coordination normal.  Skin: Skin is warm and dry. No rash noted.  1 AK on right pinna  Psychiatric: He has a normal mood and affect. His behavior is normal.  Vitals reviewed.      Assessment/plan: 1. Other secondary hypertension Hypertension Blood pressure is to goal. Continue current anti-hypertensive medications. Refills  done by cardiology. Routine lab work will be done today. Recommended routine exercise and healthy diet including DASH diet and mediterranean diet. Encouraged weight loss. F/u in 6 months.  - CBC with Differential/Platelet - Comprehensive metabolic panel - Microalbumin / creatinine urine ratio  2. Hypercholesterolemia Lipid panel today. Over due for this as it's been over one year. On no medication.  - Lipid panel  3. Chronic combined systolic and diastolic heart failure (HCC) euvolemic today. Blood pressure controlled. On ARB and followed by cardiology. Last echo showed improvement in his EF. He is overdue with appointment with cardiology and recommended he schedule this. Precautions given for any exacerbation of CHF.   4. Need for Streptococcus pneumoniae vaccination  - Pneumococcal conjugate vaccine 13-valent  5. Abnormal blood sugar  - Hemoglobin A1c  6.  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 Follows with derm yearly and would rather f/u with dermatology.   Return in about 6 months (around 12/17/2017) for routine htn follow up .   Orma Flaming, MD [place]  06/16/2017

## 2017-06-16 NOTE — Assessment & Plan Note (Signed)
Blood pressure is to goal. Continue current anti-hypertensive medications. Refills done by cardiology. Routine lab work will be done today. Recommended routine exercise and healthy diet including DASH diet and mediterranean diet. Encouraged weight loss. F/u in 6 months.

## 2017-06-16 NOTE — Assessment & Plan Note (Signed)
Lipid panel today. Over due for this as it's been over one year. On no medication.

## 2017-06-17 ENCOUNTER — Other Ambulatory Visit: Payer: Self-pay

## 2017-06-17 ENCOUNTER — Encounter: Payer: Self-pay | Admitting: Family Medicine

## 2017-06-17 DIAGNOSIS — E7800 Pure hypercholesterolemia, unspecified: Secondary | ICD-10-CM

## 2017-06-17 DIAGNOSIS — R7303 Prediabetes: Secondary | ICD-10-CM | POA: Insufficient documentation

## 2017-06-17 DIAGNOSIS — R7309 Other abnormal glucose: Secondary | ICD-10-CM

## 2017-07-07 DIAGNOSIS — N401 Enlarged prostate with lower urinary tract symptoms: Secondary | ICD-10-CM | POA: Diagnosis not present

## 2017-07-07 DIAGNOSIS — R350 Frequency of micturition: Secondary | ICD-10-CM | POA: Diagnosis not present

## 2017-07-07 DIAGNOSIS — R972 Elevated prostate specific antigen [PSA]: Secondary | ICD-10-CM | POA: Diagnosis not present

## 2017-09-02 ENCOUNTER — Ambulatory Visit: Payer: Medicare Other | Admitting: Cardiovascular Disease

## 2017-10-20 ENCOUNTER — Other Ambulatory Visit: Payer: Self-pay | Admitting: Cardiovascular Disease

## 2017-11-01 DIAGNOSIS — Z23 Encounter for immunization: Secondary | ICD-10-CM | POA: Diagnosis not present

## 2017-11-22 DIAGNOSIS — H35373 Puckering of macula, bilateral: Secondary | ICD-10-CM | POA: Diagnosis not present

## 2017-11-22 DIAGNOSIS — H402231 Chronic angle-closure glaucoma, bilateral, mild stage: Secondary | ICD-10-CM | POA: Diagnosis not present

## 2017-11-22 DIAGNOSIS — H353131 Nonexudative age-related macular degeneration, bilateral, early dry stage: Secondary | ICD-10-CM | POA: Diagnosis not present

## 2017-11-22 DIAGNOSIS — H35363 Drusen (degenerative) of macula, bilateral: Secondary | ICD-10-CM | POA: Diagnosis not present

## 2017-12-14 ENCOUNTER — Other Ambulatory Visit (INDEPENDENT_AMBULATORY_CARE_PROVIDER_SITE_OTHER): Payer: Medicare Other

## 2017-12-14 DIAGNOSIS — R7309 Other abnormal glucose: Secondary | ICD-10-CM

## 2017-12-14 DIAGNOSIS — E7800 Pure hypercholesterolemia, unspecified: Secondary | ICD-10-CM

## 2017-12-14 LAB — CBC WITH DIFFERENTIAL/PLATELET
BASOS ABS: 0.1 10*3/uL (ref 0.0–0.1)
Basophils Relative: 1.5 % (ref 0.0–3.0)
Eosinophils Absolute: 0.2 10*3/uL (ref 0.0–0.7)
Eosinophils Relative: 3.7 % (ref 0.0–5.0)
HCT: 44 % (ref 39.0–52.0)
Hemoglobin: 14.8 g/dL (ref 13.0–17.0)
LYMPHS ABS: 1 10*3/uL (ref 0.7–4.0)
Lymphocytes Relative: 23.2 % (ref 12.0–46.0)
MCHC: 33.7 g/dL (ref 30.0–36.0)
MCV: 104.8 fl — AB (ref 78.0–100.0)
MONOS PCT: 15 % — AB (ref 3.0–12.0)
Monocytes Absolute: 0.7 10*3/uL (ref 0.1–1.0)
NEUTROS ABS: 2.5 10*3/uL (ref 1.4–7.7)
NEUTROS PCT: 56.6 % (ref 43.0–77.0)
PLATELETS: 234 10*3/uL (ref 150.0–400.0)
RBC: 4.19 Mil/uL — ABNORMAL LOW (ref 4.22–5.81)
RDW: 13.3 % (ref 11.5–15.5)
WBC: 4.4 10*3/uL (ref 4.0–10.5)

## 2017-12-14 LAB — LIPID PANEL
Cholesterol: 176 mg/dL (ref 0–200)
HDL: 50 mg/dL (ref >39.00)
LDL CALC: 97 mg/dL (ref 0–99)
NonHDL: 125.97
TRIGLYCERIDES: 147 mg/dL (ref 0.0–149.0)
Total CHOL/HDL Ratio: 4
VLDL: 29.4 mg/dL (ref 0.0–40.0)

## 2017-12-14 LAB — HEMOGLOBIN A1C: Hgb A1c MFr Bld: 6.2 % (ref 4.6–6.5)

## 2017-12-17 ENCOUNTER — Encounter: Payer: Self-pay | Admitting: Family Medicine

## 2017-12-17 ENCOUNTER — Ambulatory Visit (INDEPENDENT_AMBULATORY_CARE_PROVIDER_SITE_OTHER): Payer: Medicare Other | Admitting: Family Medicine

## 2017-12-17 VITALS — BP 188/84 | HR 87 | Temp 97.7°F | Ht 66.0 in | Wt 178.6 lb

## 2017-12-17 DIAGNOSIS — E7800 Pure hypercholesterolemia, unspecified: Secondary | ICD-10-CM | POA: Diagnosis not present

## 2017-12-17 DIAGNOSIS — I10 Essential (primary) hypertension: Secondary | ICD-10-CM

## 2017-12-17 DIAGNOSIS — R7303 Prediabetes: Secondary | ICD-10-CM

## 2017-12-17 DIAGNOSIS — D539 Nutritional anemia, unspecified: Secondary | ICD-10-CM

## 2017-12-17 LAB — HIGH SENSITIVITY CRP: CRP, High Sensitivity: 0.98 mg/L (ref 0.000–5.000)

## 2017-12-17 LAB — COMPREHENSIVE METABOLIC PANEL
ALBUMIN: 4.4 g/dL (ref 3.5–5.2)
ALK PHOS: 48 U/L (ref 39–117)
ALT: 25 U/L (ref 0–53)
AST: 17 U/L (ref 0–37)
BUN: 14 mg/dL (ref 6–23)
CALCIUM: 9.4 mg/dL (ref 8.4–10.5)
CHLORIDE: 105 meq/L (ref 96–112)
CO2: 29 mEq/L (ref 19–32)
Creatinine, Ser: 0.93 mg/dL (ref 0.40–1.50)
GFR: 83.93 mL/min (ref >60.00)
Glucose, Bld: 121 mg/dL — ABNORMAL HIGH (ref 70–99)
POTASSIUM: 4.1 meq/L (ref 3.5–5.1)
Sodium: 141 mEq/L (ref 135–145)
TOTAL PROTEIN: 6.4 g/dL (ref 6.0–8.3)
Total Bilirubin: 0.5 mg/dL (ref 0.2–1.2)

## 2017-12-17 LAB — FOLATE

## 2017-12-17 LAB — VITAMIN B12: VITAMIN B 12: 589 pg/mL (ref 211–911)

## 2017-12-17 NOTE — Progress Notes (Signed)
Patient: Thomas Li MRN: [id number] DOB: [birth year] PCP: Orma Flaming, MD     Subjective:  Chief Complaint  Patient presents with  . medication follow up    HPI: The patient is a 77 y.o. male who presents today for HTN/pre diabetes follow up.   Hypertension: Here for follow up of hypertension.  Currently on losartan 25mg  . Home readings range from 539-767 HALPFXTK/24-09 diastolic. Takes medication as prescribed and denies any side effects. Exercise includes walking.  Weight has been stable. Denies any chest pain, headaches, shortness of breath, vision changes, swelling in lower extremities. He has not changed much in his routine. His blood pressure is extremely elevated. He has no symptoms. No longer followed by cardiology.   Pre diabetes: A1C is 6.2. Stable and no change since last check. Has decreased portion size, but not really changed what he is eating.   Hyperlipidemia: cholesterol is extremely well controlled, but his ASCVD risk factor is 48%. Discussed this today.   Macrocytosis: drinks occasionally. Not heavy drinker.    Review of Systems  Constitutional: Negative for fatigue.  Respiratory: Negative for shortness of breath.   Cardiovascular: Negative for chest pain and palpitations.  Gastrointestinal: Negative for abdominal pain and nausea.  Neurological: Negative for dizziness and headaches.  Psychiatric/Behavioral: The patient is nervous/anxious.     Allergies Patient is allergic to lisinopril.  Past Medical History Patient  has a past medical history of Chronic combined systolic and diastolic heart failure ([place]) (07/18/2015), Hypercholesterolemia, Hypertension, Nocturnal polyuria, and Plantar fasciitis.  Surgical History Patient  has no past surgical history on file.  Family History Pateint's family history includes Heart disease (age of onset: 43) in his father.  Social History Patient  reports that he quit smoking about 49 years ago. He has never used  smokeless tobacco. He reports that he drinks alcohol. He reports that he does not use drugs.    Objective: Vitals:   12/17/17 0805 12/17/17 0811 12/17/17 0830  BP: (!) 200/92 (!) 184/96 (!) 188/84  Pulse: 89 87   Temp: 97.7 F (36.5 C)    TempSrc: Oral    SpO2: 98%    Weight: 178 lb 9.6 oz (81 kg)    Height: 5\' 6"  (1.676 m)      Body mass index is 28.83 kg/m.  Physical Exam  Constitutional: He is oriented to person, place, and time. He appears well-developed and well-nourished.  HENT:  Right Ear: External ear normal.  Left Ear: External ear normal.  Mouth/Throat: Oropharynx is clear and moist.  Eyes: Pupils are equal, round, and reactive to light. Conjunctivae and EOM are normal.  Neck: Normal range of motion. Neck supple.  Cardiovascular: Normal rate and regular rhythm.  Murmur heard. Pulmonary/Chest: Effort normal and breath sounds normal.  Abdominal: Soft. Bowel sounds are normal.  Neurological: He is alert and oriented to person, place, and time. No cranial nerve deficit.  Vitals reviewed.      Assessment/plan: 1. Essential hypertension Way above goal. Increasing losartan to 50mg  as he is unsure if his BP is really this high since much better controlled with his home readings. Discussed It is too high for me to not adjust medication and his risk of MI, CVA, damage is high. He is on board to increase his losartan to 50mg . We will have close f/u with him in 8 days after the holidays. He is to continue to keep a log and call me before this if BP is not coming down. Precautions given.  2. Hypercholesterolemia ASCVD risk is 48%. Discussed this in detail with him and what it means. He is 77 years old and I do think he could live another 10 years. With his numbers being so good, we will check a high sens. CRP and discussed with him if this is elevated I would recommend a statin. He is onboard with this.  - CRP High sensitivity - Comprehensive metabolic panel  3.  Pre-diabetes Stable. Continue diet/lifestyle modifications. Recheck in 6 months.   4. Macrocytic anemia  - Folate - Vitamin B12    Return in about 1 week (around 12/24/2017) for bp check (come back monay 12/27/17).    Orma Flaming, MD [place]   12/17/2017

## 2017-12-17 NOTE — Patient Instructions (Signed)
INCREASE YOUR LOSARTAN to 50mg  DAILY... Come back Monday after thanksgiving for BP check by [facility]. i'll pop in and tell you if we need to increase medication.    CHECKING LABS for heart specific test for cholesterol. If high, I think I would push a statin drug for cholesterol more. We would treat your risk factor of 48%, not your numbers which are really well controlled.

## 2017-12-27 ENCOUNTER — Ambulatory Visit (INDEPENDENT_AMBULATORY_CARE_PROVIDER_SITE_OTHER): Payer: Medicare Other

## 2017-12-27 ENCOUNTER — Telehealth: Payer: Self-pay

## 2017-12-27 ENCOUNTER — Other Ambulatory Visit: Payer: Self-pay | Admitting: Family Medicine

## 2017-12-27 VITALS — BP 184/102

## 2017-12-27 DIAGNOSIS — I10 Essential (primary) hypertension: Secondary | ICD-10-CM | POA: Diagnosis not present

## 2017-12-27 MED ORDER — ROSUVASTATIN CALCIUM 10 MG PO TABS: 10.0000 mg | ORAL_TABLET | Freq: Every day | ORAL | 3 refills | Status: DC

## 2017-12-27 NOTE — Progress Notes (Signed)
cr

## 2017-12-27 NOTE — Progress Notes (Signed)
He needs to increase his losartan to 50mg  and then follow up with me in 2 weeks for appointment. I also sent in cholesterol medication for him.

## 2017-12-27 NOTE — Telephone Encounter (Signed)
Called and spoke with patient and advised him per Dr. Rogers Blocker to increase dosage of Losartan to 50 mg daily.  (Pt currently has 25 mg on hand-advised to double up on dosage and contact us when he needs refill on this since he will run out of medication sooner than expected) Also advised pt that cholesterol med (rosuvastatin) has been sent to pharmacy and finally advised pt he needs to follow up with an office visit with Dr. Rogers Blocker in 2 wks (appt scheduled for  12/16 at 8 am) Pt verbalized understanding of all of the above.

## 2017-12-27 NOTE — Progress Notes (Signed)
Pt here for blood pressure check.  Feels fine and denies chest pain, SOB, dizziness, or any other symptoms. In patient's chart, states that he is on 50 mg qd of Losartan.  Pt states that he is certain that he only takes 25 mg daily  BP here in office today was 184/102 in left arm, normal cuff. Home cuff was 150/100. In left arm, 156/110 in right arm.  Home readings are as follows: 11/25-6 am  129/69 11/26-6 am  165/84, repeat 15/4/73, 12:30 pm- 128/63 11/27 6 am   158/77, repeat 139/80, 3 pm-129/77 11/28 8 am   144/79, repeat 140/70 11/29 8 am   134/77 11/30 6:45 pm   180/80 12/1 7:45 am  150/78; 4 pm 175/80; 6:17 pm 172/80;  12/2 6:30 am  165/80

## 2017-12-27 NOTE — Telephone Encounter (Signed)
-----   Message from Orma Flaming, MD sent at 12/27/2017  2:08 PM EST -----   ----- Message ----- From: Kevan Ny, CMA Sent: 12/27/2017  10:03 AM EST To: Orma Flaming, MD

## 2017-12-31 DIAGNOSIS — R972 Elevated prostate specific antigen [PSA]: Secondary | ICD-10-CM | POA: Diagnosis not present

## 2018-01-07 DIAGNOSIS — N401 Enlarged prostate with lower urinary tract symptoms: Secondary | ICD-10-CM | POA: Diagnosis not present

## 2018-01-07 DIAGNOSIS — R972 Elevated prostate specific antigen [PSA]: Secondary | ICD-10-CM | POA: Diagnosis not present

## 2018-01-07 DIAGNOSIS — R3912 Poor urinary stream: Secondary | ICD-10-CM | POA: Diagnosis not present

## 2018-01-10 ENCOUNTER — Ambulatory Visit (INDEPENDENT_AMBULATORY_CARE_PROVIDER_SITE_OTHER): Payer: Medicare Other | Admitting: Family Medicine

## 2018-01-10 ENCOUNTER — Encounter: Payer: Self-pay | Admitting: Family Medicine

## 2018-01-10 VITALS — BP 174/84 | HR 102 | Temp 97.8°F | Ht 66.0 in | Wt 183.0 lb

## 2018-01-10 DIAGNOSIS — I10 Essential (primary) hypertension: Secondary | ICD-10-CM | POA: Diagnosis not present

## 2018-01-10 MED ORDER — LOSARTAN POTASSIUM 100 MG PO TABS: 100.0000 mg | ORAL_TABLET | Freq: Every day | ORAL | 3 refills | Status: DC

## 2018-01-10 NOTE — Progress Notes (Signed)
Patient: Thomas Li MRN: [id number] DOB: 04/10/1940 PCP: Orma Flaming, MD     Subjective:  Chief Complaint  Patient presents with  . Hypertension    HPI: The patient is a 78 y.o. male who presents today for hypertension. We increased his losartan to 50mg  after multiple readings of uncontrolled HTN. He has his home readings with him as well as his own cuff which is collaborated correctly.  Home readings range from 148-170/68-80. He continues to be asymptomatic. Takes medication as prescribed.   Review of Systems  Constitutional: Negative for fatigue.  Respiratory: Negative for shortness of breath.   Cardiovascular: Negative for chest pain.  Gastrointestinal: Negative for abdominal pain and nausea.  Neurological: Negative for dizziness and headaches.  Psychiatric/Behavioral: Negative for sleep disturbance.    Allergies Patient is allergic to lisinopril.  Past Medical History Patient  has a past medical history of Chronic combined systolic and diastolic heart failure ([place]) (07/18/2015), Hypercholesterolemia, Hypertension, Nocturnal polyuria, and Plantar fasciitis.  Surgical History Patient  has no past surgical history on file.  Family History Pateint's family history includes Heart disease (age of onset: 36) in his father.  Social History Patient  reports that he quit smoking about 49 years ago. He has never used smokeless tobacco. He reports current alcohol use. He reports that he does not use drugs.    Objective: Vitals:   01/10/18 0802 01/10/18 0823 01/10/18 0836  BP: (!) 184/98 (!) 164/96 (!) 174/84  Pulse: (!) 102    Temp: 97.8 F (36.6 C)    TempSrc: Oral    SpO2: 96%    Weight: 183 lb (83 kg)    Height: 5\' 6"  (1.676 m)      Body mass index is 29.54 kg/m.  Physical Exam Vitals signs reviewed.  Constitutional:      Appearance: Normal appearance.  Neck:     Musculoskeletal: Normal range of motion and neck supple.  Cardiovascular:     Rate and  Rhythm: Normal rate and regular rhythm.     Heart sounds: Murmur present.  Pulmonary:     Effort: Pulmonary effort is normal.     Breath sounds: Normal breath sounds.  Abdominal:     General: Abdomen is flat. Bowel sounds are normal.     Palpations: Abdomen is soft.  Neurological:     General: No focal deficit present.     Mental Status: He is alert and oriented to person, place, and time.  Psychiatric:        Mood and Affect: Mood normal.        Behavior: Behavior normal.        Assessment/plan: 1. Essential hypertension We are going to increase his losartan to 100mg . Have him come back in 1 months time. Continue with home log.    Return in about 1 month (around 02/10/2018) for htn .    Orma Flaming, MD [place]   01/10/2018

## 2018-01-12 ENCOUNTER — Telehealth: Payer: Self-pay | Admitting: Family Medicine

## 2018-01-12 NOTE — Telephone Encounter (Signed)
Called and advised patient of notes per Dr. Rogers Blocker.  He verbalized understanding.  Also advised patient that he will need to fill out and sign/date an updated DPR for [place] to disclose any PHI to his wife, Romie Minus in the future.

## 2018-01-12 NOTE — Telephone Encounter (Signed)
Let him know that his wife emailed concerned about his nasal spray. If he is using flonase, he should be fine. It shouldn't affect his blood pressure that significantly. He should not be using afrin.   aw

## 2018-02-07 ENCOUNTER — Ambulatory Visit (INDEPENDENT_AMBULATORY_CARE_PROVIDER_SITE_OTHER): Payer: Medicare Other | Admitting: Family Medicine

## 2018-02-07 ENCOUNTER — Encounter: Payer: Self-pay | Admitting: Family Medicine

## 2018-02-07 VITALS — BP 142/80 | HR 81 | Temp 98.1°F | Ht 66.0 in | Wt 183.2 lb

## 2018-02-07 DIAGNOSIS — I10 Essential (primary) hypertension: Secondary | ICD-10-CM

## 2018-02-07 MED ORDER — AMLODIPINE BESYLATE 5 MG PO TABS: 5.0000 mg | ORAL_TABLET | Freq: Every day | ORAL | 3 refills | Status: DC

## 2018-02-07 NOTE — Progress Notes (Signed)
Patient: Thomas Li MRN: [id number] DOB: [birth year] PCP: Orma Flaming, MD     Subjective:  Chief Complaint  Patient presents with  . Hypertension    HPI: The patient is a 78 y.o. male who presents today for hypertension followup.   Hypertension: Here for follow up of hypertension.  Currently on losartan 100mg  daily. Home readings range from 409-811 [id number]/62 diastolic. Takes medication as prescribed and denies any side effects. Exercise includes walking. He has started to walk more consistently and actually do a regimen. Weight has been stable. Denies any chest pain, headaches, shortness of breath, vision changes, swelling in lower extremities. He has taken his medication this AM around 5:00am.    Review of Systems  Constitutional: Negative for fatigue.  Respiratory: Negative for shortness of breath.   Cardiovascular: Negative for chest pain.  Gastrointestinal: Negative for abdominal pain and nausea.  Neurological: Negative for dizziness and headaches.  Psychiatric/Behavioral: Negative for sleep disturbance.    Allergies Patient is allergic to lisinopril.  Past Medical History Patient  has a past medical history of Chronic combined systolic and diastolic heart failure ([place]) (07/18/2015), Hypercholesterolemia, Hypertension, Nocturnal polyuria, and Plantar fasciitis.  Surgical History Patient  has no past surgical history on file.  Family History Thomas Li family history includes Heart disease (age of onset: 20) in his father.  Social History Patient  reports that he quit smoking about 50 years ago. He has never used smokeless tobacco. He reports current alcohol use. He reports that he does not use drugs.    Objective: Vitals:   02/07/18 0802 02/07/18 0828  BP: (!) 162/90 (!) 142/80  Pulse: 81   Temp: 98.1 F (36.7 C)   TempSrc: Oral   SpO2: 100%   Weight: 183 lb 3.2 oz (83.1 kg)   Height: 5\' 6"  (1.676 m)     Body mass index is 29.57 kg/m.  Physical  Exam Vitals signs reviewed.  Constitutional:      Appearance: Normal appearance.  Neck:     Musculoskeletal: Normal range of motion and neck supple.  Cardiovascular:     Rate and Rhythm: Normal rate and regular rhythm.     Heart sounds: Murmur present.  Pulmonary:     Effort: Pulmonary effort is normal.     Breath sounds: Normal breath sounds.  Abdominal:     General: Abdomen is flat. Bowel sounds are normal.     Palpations: Abdomen is soft.  Neurological:     General: No focal deficit present.     Mental Status: He is alert and oriented to person, place, and time.        Assessment/plan: 1. Essential hypertension Still not to goal, will have to add on another agent. Adding on norvasc 2.5mg . can increase to 5mg  if still consistently above 140/90. See back in 3 months since close to goal. Side effects of medication discussed. We want him under good control with his grade 1 diastolic dysfunction.     Return in about 4 months (around 06/08/2018) for medicare/htn/chol f/u .    Orma Flaming, MD [place]  02/07/2018

## 2018-02-07 NOTE — Patient Instructions (Signed)
Starting you on norvasc as well for blood pressure control. Take once a day. Let me know if any issues, otherwise i'll see you in 4 months !

## 2018-02-10 DIAGNOSIS — R350 Frequency of micturition: Secondary | ICD-10-CM | POA: Diagnosis not present

## 2018-02-10 DIAGNOSIS — R3912 Poor urinary stream: Secondary | ICD-10-CM | POA: Diagnosis not present

## 2018-02-10 DIAGNOSIS — N401 Enlarged prostate with lower urinary tract symptoms: Secondary | ICD-10-CM | POA: Diagnosis not present

## 2018-02-14 ENCOUNTER — Other Ambulatory Visit: Payer: Self-pay | Admitting: Urology

## 2018-03-07 DIAGNOSIS — Z23 Encounter for immunization: Secondary | ICD-10-CM | POA: Diagnosis not present

## 2018-03-07 DIAGNOSIS — L309 Dermatitis, unspecified: Secondary | ICD-10-CM | POA: Diagnosis not present

## 2018-03-07 DIAGNOSIS — L570 Actinic keratosis: Secondary | ICD-10-CM | POA: Diagnosis not present

## 2018-03-07 DIAGNOSIS — L814 Other melanin hyperpigmentation: Secondary | ICD-10-CM | POA: Diagnosis not present

## 2018-03-07 DIAGNOSIS — D225 Melanocytic nevi of trunk: Secondary | ICD-10-CM | POA: Diagnosis not present

## 2018-03-07 DIAGNOSIS — Z85828 Personal history of other malignant neoplasm of skin: Secondary | ICD-10-CM | POA: Diagnosis not present

## 2018-03-07 DIAGNOSIS — L820 Inflamed seborrheic keratosis: Secondary | ICD-10-CM | POA: Diagnosis not present

## 2018-03-07 DIAGNOSIS — Z86018 Personal history of other benign neoplasm: Secondary | ICD-10-CM | POA: Diagnosis not present

## 2018-03-07 DIAGNOSIS — L821 Other seborrheic keratosis: Secondary | ICD-10-CM | POA: Diagnosis not present

## 2018-03-09 ENCOUNTER — Encounter (HOSPITAL_BASED_OUTPATIENT_CLINIC_OR_DEPARTMENT_OTHER): Payer: Self-pay | Admitting: *Deleted

## 2018-03-09 ENCOUNTER — Other Ambulatory Visit: Payer: Self-pay

## 2018-03-09 NOTE — Progress Notes (Signed)
SPOKE WITH JEAN WIFE OF PATIENT NPO AFTER MIDNIGHT, ARRIVE 645 AM 03-11-2018 [facility] MEDS TO TAKE SIP OF WATER: AMLODIPINE, ROSUVASTATIN, TOLTRODONE WIFE JEAN DRIVER NEEDS EKG AND I STAT 4 HAS SURGERY ORDERS IN EPIC

## 2018-03-09 NOTE — H&P (Signed)
Office Visit Report     02/10/2018   --------------------------------------------------------------------------------   Thomas Li  MRN: [id number]  PRIMARY CARE:  Orma Flaming, MD  DOB: 1940-11-30, 78 year old Male  REFERRING:  Marcello Moores A. Emmaline Kluver, MD  SSN: -[id number]  PROVIDER:  Festus Aloe, M.D.    LOCATION:  [facility] [phone]   --------------------------------------------------------------------------------   CC: I have symptoms of an enlarged prostate.  HPI: Thomas Li is a 78 year-old male established patient who is here for symptoms of enlarged prostate.  He first noticed the symptoms approximately 11/27/2011. His symptoms have not gotten worse over the last year. He has been treated with Flomax. The patient has never had a surgical procedure for bladder outlet obstruction to his prostate.   Symptoms of frequency and urgency. On tamsulosin. Prostate 73 g on imaging.   He has some frequency and urgency. He tried finasteride and thought he had more trouble and stopped it. His AUASS was 14, mostly dis. He continued tamsulosin and we added tolterodine.   He has bothersome frequency and intermittent stream. He is on tolterodine and tamsulosin. AUASS = 18.   He returns for cystoscopy.     ALLERGIES: No Allergies    MEDICATIONS: Tamsulosin Hcl 0.4 mg capsule TAKE 1 CAPSULE BY MOUTH EVERY DAY  Tolterodine Tartrate 2 mg tablet TAKE 1 TABLET BY MOUTH EVERY MORNING  Amlodipine Besylate 2.5 mg tablet  CoQ-10 100 MG Oral Capsule Oral  Icaps Areds2  Latanoprost 0.005 % drops  Losartan Potassium 25 mg tablet  Multi-Vitamin TABS Oral  Rosuvastatin Calcium 10 mg tablet     GU PSH: No GU PSH      PSH Notes: Knee Surgery Left   NON-GU PSH: No Non-GU PSH    GU PMH: BPH w/LUTS - 01/07/2018, - 07/07/2017, - 01/06/2017, - 06/29/2016, - 12/04/2015, Benign localized prostatic hyperplasia with lower urinary tract symptoms (LUTS), - 2016 Elevated  PSA - 01/07/2018, - 07/07/2017, - 01/06/2017, - 06/29/2016, - 12/04/2015, Elevated prostate specific antigen (PSA), - 2017 Weak Urinary Stream - 01/07/2018 Oth hemorrhoids - 09/02/2016 Urinary Frequency - 12/04/2015    NON-GU PMH: Encounter for general adult medical examination without abnormal findings, Encounter for preventive health examination - 2017 Personal history of other diseases of the nervous system and sense organs, History of glaucoma - 2014    FAMILY HISTORY: cardiac disorder - Runs In Family Death In The Family Father - Runs In Family Death In The Family Mother - Runs In Family Family Health Status Number - Runs In Family Strokes - Runs In Family   SOCIAL HISTORY: Marital Status: Married Preferred Language: English; Race: White Current Smoking Status: Patient has never smoked.  Drinks 1 caffeinated drink per day.     Notes: Former smoker, Being A Economist, Occupation:, Marital History - Currently Married, Caffeine Use   REVIEW OF SYSTEMS:    GU Review Male:   Patient reports trouble starting your stream, leakage of urine, have to strain to urinate , erection problems, and penile pain. Patient denies frequent urination, get up at night to urinate, stream starts and stops, burning/ pain with urination, and hard to postpone urination.  Gastrointestinal (Upper):   Patient denies nausea, vomiting, and indigestion/ heartburn.  Gastrointestinal (Lower):   Patient denies diarrhea and constipation.  Constitutional:   Patient denies fever, night sweats, weight loss, and fatigue.  Skin:   Patient denies skin rash/ lesion and itching.  Eyes:   Patient denies blurred vision  and double vision.  Ears/ Nose/ Throat:   Patient denies sore throat and sinus problems.  Hematologic/Lymphatic:   Patient denies swollen glands and easy bruising.  Cardiovascular:   Patient denies leg swelling and chest pains.  Respiratory:   Patient denies cough and shortness of breath.  Endocrine:   Patient  denies excessive thirst.  Musculoskeletal:   Patient denies back pain and joint pain.  Neurological:   Patient denies headaches and dizziness.  Psychologic:   Patient denies depression and anxiety.   VITAL SIGNS:      02/10/2018 10:00 AM  BP 152/81 mmHg  Pulse 97 /min  Temperature 97.4 F / 36.3 C   GU PHYSICAL EXAMINATION:    Scrotum: No lesions. No edema. No cysts. No warts.  Urethral Meatus: Normal size. No lesion, no wart, no discharge, no polyp. Normal location.  Penis: Circumcised, no warts, no cracks. No dorsal Peyronie's plaques, no left corporal Peyronie's plaques, no right corporal Peyronie's plaques, no scarring, no warts. No balanitis, no meatal stenosis.   MULTI-SYSTEM PHYSICAL EXAMINATION:    Constitutional: Well-nourished. No physical deformities. Normally developed. Good grooming.  Neck: Neck symmetrical, not swollen. Normal tracheal position.  Respiratory: No labored breathing, no use of accessory muscles.   Cardiovascular: Normal temperature, normal extremity pulses, no swelling, no varicosities.  Skin: No paleness, no jaundice, no cyanosis. No lesion, no ulcer, no rash.  Neurologic / Psychiatric: Oriented to time, oriented to place, oriented to person. No depression, no anxiety, no agitation.  Gastrointestinal: No mass, no tenderness, no rigidity, non obese abdomen.     PAST DATA REVIEWED:  Source Of History:  Patient   12/31/17 07/07/17 02/03/17 12/28/16 06/29/16 12/04/15 04/16/15 11/09/14  PSA  Total PSA 6.00 ng/mL 6.40 ng/mL 5.64 ng/mL 9.04 ng/mL 5.27 ng/dl 3.98 ng/dl 6.06  4.65   Free PSA 1.28 ng/mL 1.02 ng/mL 1.12 ng/mL 1.67 ng/mL 0.96 ng/dl  1.32  0.97   % Free PSA 21 % PSA 16 % PSA 20 % PSA 18 % PSA 18 %  22  21     PROCEDURES:         Flexible Cystoscopy - 52000  Risks, benefits, and some of the potential complications of the procedure were discussed with the patient. All questions were answered. Informed consent was obtained. Antibiotic prophylaxis was  given -- Cephalexin. Sterile technique and intraurethral analgesia were used.  Meatus:  Normal size. Normal location. Normal condition.  Urethra:  No strictures.  External Sphincter:  Normal.  Verumontanum:  Normal.  Prostate:  Obstructing. Enlarged median lobe. Mild hyperplasia.  Bladder Neck:  Non-obstructing.  Ureteral Orifices:  Normal location. Normal size. Normal shape. Effluxed clear urine.  Bladder:  Mild trabeculation. No tumors. Normal mucosa. No stones.      The lower urinary tract was carefully examined. The procedure was well-tolerated and without complications. Antibiotic instructions were given. Instructions were given to call the office immediately for bloody urine, difficulty urinating, painful urination, fever, chills, nausea, vomiting or other illness. The patient stated that he understood these instructions and would comply with them.        PVR Ultrasound - 40981  Scanned Volume: 81 cc         Urinalysis Dipstick Dipstick Cont'd  Color: Yellow Bilirubin: Neg mg/dL  Appearance: Clear Ketones: Neg mg/dL  Specific Gravity: <=1.005 Blood: Neg ery/uL  pH: 7.5 Protein: Neg mg/dL  Glucose: Neg mg/dL Urobilinogen: 0.2 mg/dL    Nitrites: Neg    Leukocyte Esterase: Neg leu/uL  ASSESSMENT:      ICD-10 Details  1 GU:   BPH w/LUTS - N40.1   2   Weak Urinary Stream - R39.12   3   Urinary Frequency - R35.0    PLAN:           Schedule Return Visit/Planned Activity: Next Available Appointment - Schedule Surgery          Document Letter(s):  Created for Patient: Clinical Summary         Notes:   bph, luts - cysto today consistent with obstruction from the median lobe. I went over the anatomy with the patient. I discussed with the patient the nature r/b/a to laser vaporization of prostate including side effects of the procedure, expected post-op course and likelihood of success. We discussed flow symptoms and irritative symptoms typically improve, but frequency and  urgency can persist and rarely worsen. We also discussed risk of bleeding, infection, stricture, sexual dysfunction and incontinence among others. All questions answered. He elects to proceed. Discussed urodynamics but he declined.   cc; Dr. Rogers Blocker     * Signed by Festus Aloe, M.D. on 02/11/18 at 9:35 AM (EST)*     The information contained in this medical record document is considered private and confidential patient information. This information can only be used for the medical diagnosis and/or medical services that are being provided by the patient's selected caregivers. This information can only be distributed outside of the patient's care if the patient agrees and signs waivers of authorization for this information to be sent to an outside source or route.

## 2018-03-10 NOTE — Anesthesia Preprocedure Evaluation (Addendum)
Anesthesia Evaluation  Patient identified by MRN, date of birth, ID band Patient awake    Reviewed: Allergy & Precautions, H&P , NPO status , Patient's Chart, lab work & pertinent test results, reviewed documented beta blocker date and time   History of Anesthesia Complications (+) PONV and history of anesthetic complications  Airway Mallampati: II  TM Distance: >3 FB Neck ROM: full    Dental no notable dental hx.    Pulmonary neg pulmonary ROS, former smoker,    Pulmonary exam normal breath sounds clear to auscultation       Cardiovascular Exercise Tolerance: Good hypertension, Pt. on medications + CAD  + dysrhythmias  Rhythm:regular Rate:Normal  ECHO 17' Impressions: Normal LV size with mild LV hypertrophy. EF 55-60%. Septal bounce consistent with LBBB. Normal RV size and systolic function. No significant valvular abnormalities  LEXISCAN 6/17 Moderate size and intensity, partially reversible (SDS 3) anteroseptal perfusion defect which may be scar or LBBB-related artifact. There is an associated wall motion abnormality. LVEF 47%. This is an intermediate risk study.    Neuro/Psych negative neurological ROS  negative psych ROS   GI/Hepatic negative GI ROS, Neg liver ROS,   Endo/Other  negative endocrine ROS  Renal/GU negative Renal ROS  negative genitourinary   Musculoskeletal  (+) Arthritis ,   Abdominal   Peds  Hematology negative hematology ROS (+)   Anesthesia Other Findings   Reproductive/Obstetrics negative OB ROS                          Anesthesia Physical Anesthesia Plan  ASA: II  Anesthesia Plan: General   Post-op Pain Management:    Induction: Intravenous  PONV Risk Score and Plan: 2 and Ondansetron, Treatment may vary due to age or medical condition and Dexamethasone  Airway Management Planned: Oral ETT and LMA  Additional Equipment:   Intra-op Plan:    Post-operative Plan: Extubation in OR  Informed Consent: I have reviewed the patients History and Physical, chart, labs and discussed the procedure including the risks, benefits and alternatives for the proposed anesthesia with the patient or authorized representative who has indicated his/her understanding and acceptance.     Dental Advisory Given  Plan Discussed with: CRNA  Anesthesia Plan Comments: (  )       Anesthesia Quick Evaluation

## 2018-03-11 ENCOUNTER — Encounter (HOSPITAL_BASED_OUTPATIENT_CLINIC_OR_DEPARTMENT_OTHER)
Admission: RE | Disposition: A | Discharge status: Home / Self Care | Payer: Self-pay | Source: Home / Self Care | Attending: Urology

## 2018-03-11 ENCOUNTER — Ambulatory Visit (HOSPITAL_BASED_OUTPATIENT_CLINIC_OR_DEPARTMENT_OTHER)
Admission: RE | Admit: 2018-03-11 | Discharge: 2018-03-11 | Disposition: A | Discharge status: Home / Self Care | Payer: Medicare Other | Attending: Urology | Admitting: Urology

## 2018-03-11 ENCOUNTER — Other Ambulatory Visit: Payer: Self-pay

## 2018-03-11 ENCOUNTER — Encounter (HOSPITAL_BASED_OUTPATIENT_CLINIC_OR_DEPARTMENT_OTHER): Payer: Self-pay | Admitting: Anesthesiology

## 2018-03-11 ENCOUNTER — Ambulatory Visit (HOSPITAL_BASED_OUTPATIENT_CLINIC_OR_DEPARTMENT_OTHER): Payer: Medicare Other | Admitting: Anesthesiology

## 2018-03-11 DIAGNOSIS — Z79899 Other long term (current) drug therapy: Secondary | ICD-10-CM | POA: Diagnosis not present

## 2018-03-11 DIAGNOSIS — I110 Hypertensive heart disease with heart failure: Secondary | ICD-10-CM | POA: Diagnosis not present

## 2018-03-11 DIAGNOSIS — Z8249 Family history of ischemic heart disease and other diseases of the circulatory system: Secondary | ICD-10-CM | POA: Diagnosis not present

## 2018-03-11 DIAGNOSIS — R350 Frequency of micturition: Secondary | ICD-10-CM | POA: Insufficient documentation

## 2018-03-11 DIAGNOSIS — I5042 Chronic combined systolic (congestive) and diastolic (congestive) heart failure: Secondary | ICD-10-CM | POA: Diagnosis not present

## 2018-03-11 DIAGNOSIS — N401 Enlarged prostate with lower urinary tract symptoms: Secondary | ICD-10-CM | POA: Diagnosis not present

## 2018-03-11 DIAGNOSIS — Z87891 Personal history of nicotine dependence: Secondary | ICD-10-CM | POA: Diagnosis not present

## 2018-03-11 DIAGNOSIS — M1990 Unspecified osteoarthritis, unspecified site: Secondary | ICD-10-CM | POA: Diagnosis not present

## 2018-03-11 DIAGNOSIS — R3912 Poor urinary stream: Secondary | ICD-10-CM | POA: Diagnosis not present

## 2018-03-11 DIAGNOSIS — Z823 Family history of stroke: Secondary | ICD-10-CM | POA: Diagnosis not present

## 2018-03-11 DIAGNOSIS — I447 Left bundle-branch block, unspecified: Secondary | ICD-10-CM | POA: Diagnosis not present

## 2018-03-11 DIAGNOSIS — N138 Other obstructive and reflux uropathy: Secondary | ICD-10-CM

## 2018-03-11 DIAGNOSIS — I10 Essential (primary) hypertension: Secondary | ICD-10-CM | POA: Diagnosis not present

## 2018-03-11 HISTORY — DX: Other complications of anesthesia, initial encounter: T88.59XA

## 2018-03-11 HISTORY — PX: THULIUM LASER TURP (TRANSURETHRAL RESECTION OF PROSTATE): SHX6744

## 2018-03-11 HISTORY — DX: Abnormal electrocardiogram (ECG) (EKG): R94.31

## 2018-03-11 HISTORY — DX: Poor urinary stream: R39.12

## 2018-03-11 HISTORY — DX: Adverse effect of unspecified anesthetic, initial encounter: T41.45XA

## 2018-03-11 HISTORY — DX: Nausea with vomiting, unspecified: R11.2

## 2018-03-11 HISTORY — DX: Nausea with vomiting, unspecified: Z98.890

## 2018-03-11 LAB — POCT I-STAT 4, (NA,K, GLUC, HGB,HCT)
Glucose, Bld: 118 mg/dL — ABNORMAL HIGH (ref 70–99)
HCT: 42 % (ref 39.0–52.0)
Hemoglobin: 14.3 g/dL (ref 13.0–17.0)
Potassium: 4 mmol/L (ref 3.5–5.1)
Sodium: 141 mmol/L (ref 135–145)

## 2018-03-11 SURGERY — THULIUM LASER TURP (TRANSURETHRAL RESECTION OF PROSTATE)
Anesthesia: General

## 2018-03-11 MED ORDER — CEFAZOLIN SODIUM-DEXTROSE 2-4 GM/100ML-% IV SOLN
INTRAVENOUS | Status: AC
  Filled 2018-03-11: qty 100

## 2018-03-11 MED ORDER — SODIUM CHLORIDE 0.9 % IR SOLN
Status: DC | PRN
  Administered 2018-03-11: 3000 mL via INTRAVESICAL

## 2018-03-11 MED ORDER — LIDOCAINE HCL URETHRAL/MUCOSAL 2 % EX GEL
CUTANEOUS | Status: DC | PRN
  Administered 2018-03-11: 1 via URETHRAL

## 2018-03-11 MED ORDER — DEXAMETHASONE SODIUM PHOSPHATE 10 MG/ML IJ SOLN
INTRAMUSCULAR | Status: AC
  Filled 2018-03-11: qty 1

## 2018-03-11 MED ORDER — PROPOFOL 10 MG/ML IV BOLUS
INTRAVENOUS | Status: DC | PRN
  Administered 2018-03-11: 50 mg via INTRAVENOUS
  Administered 2018-03-11: 150 mg via INTRAVENOUS

## 2018-03-11 MED ORDER — ONDANSETRON HCL 4 MG/2ML IJ SOLN
INTRAMUSCULAR | Status: AC
  Filled 2018-03-11: qty 2

## 2018-03-11 MED ORDER — LIDOCAINE HCL URETHRAL/MUCOSAL 2 % EX GEL
CUTANEOUS | Status: AC
  Filled 2018-03-11: qty 5

## 2018-03-11 MED ORDER — CEPHALEXIN 500 MG PO CAPS: 500.0000 mg | ORAL_CAPSULE | Freq: Every day | ORAL | 0 refills | Status: DC

## 2018-03-11 MED ORDER — PHENYLEPHRINE 40 MCG/ML (10ML) SYRINGE FOR IV PUSH (FOR BLOOD PRESSURE SUPPORT)
PREFILLED_SYRINGE | INTRAVENOUS | Status: AC
  Filled 2018-03-11: qty 10

## 2018-03-11 MED ORDER — PHENYLEPHRINE HCL 10 MG/ML IJ SOLN
INTRAMUSCULAR | Status: AC
  Filled 2018-03-11: qty 1

## 2018-03-11 MED ORDER — FENTANYL CITRATE (PF) 100 MCG/2ML IJ SOLN
INTRAMUSCULAR | Status: DC | PRN
  Administered 2018-03-11 (×2): 25 ug via INTRAVENOUS

## 2018-03-11 MED ORDER — CEFAZOLIN SODIUM-DEXTROSE 2-4 GM/100ML-% IV SOLN
2.0000 g | Freq: Once | INTRAVENOUS | Status: AC
  Administered 2018-03-11: 2 g via INTRAVENOUS
  Filled 2018-03-11: qty 100

## 2018-03-11 MED ORDER — SUCCINYLCHOLINE CHLORIDE 200 MG/10ML IV SOSY
PREFILLED_SYRINGE | INTRAVENOUS | Status: AC
  Filled 2018-03-11: qty 20

## 2018-03-11 MED ORDER — FENTANYL CITRATE (PF) 100 MCG/2ML IJ SOLN
INTRAMUSCULAR | Status: AC
  Filled 2018-03-11: qty 4

## 2018-03-11 MED ORDER — PROPOFOL 10 MG/ML IV BOLUS
INTRAVENOUS | Status: AC
  Filled 2018-03-11: qty 20

## 2018-03-11 MED ORDER — PHENYLEPHRINE HCL 10 MG/ML IJ SOLN
INTRAMUSCULAR | Status: DC | PRN
  Administered 2018-03-11 (×3): 120 ug via INTRAVENOUS

## 2018-03-11 MED ORDER — ONDANSETRON HCL 4 MG/2ML IJ SOLN
INTRAMUSCULAR | Status: DC | PRN
  Administered 2018-03-11: 4 mg via INTRAVENOUS

## 2018-03-11 MED ORDER — PROPOFOL 10 MG/ML IV BOLUS
INTRAVENOUS | Status: AC
  Filled 2018-03-11: qty 40

## 2018-03-11 MED ORDER — MIDAZOLAM HCL 2 MG/2ML IJ SOLN
INTRAMUSCULAR | Status: DC | PRN
  Administered 2018-03-11: 1 mg via INTRAVENOUS

## 2018-03-11 MED ORDER — LIDOCAINE 2% (20 MG/ML) 5 ML SYRINGE
INTRAMUSCULAR | Status: DC | PRN
  Administered 2018-03-11: 60 mg via INTRAVENOUS

## 2018-03-11 MED ORDER — BELLADONNA ALKALOIDS-OPIUM 16.2-60 MG RE SUPP
RECTAL | Status: AC
  Filled 2018-03-11: qty 1

## 2018-03-11 MED ORDER — WHITE PETROLATUM EX OINT
TOPICAL_OINTMENT | CUTANEOUS | Status: AC
  Filled 2018-03-11: qty 10

## 2018-03-11 MED ORDER — LACTATED RINGERS IV SOLN
INTRAVENOUS | Status: DC | PRN
  Administered 2018-03-11: 07:00:00 via INTRAVENOUS

## 2018-03-11 MED ORDER — MIDAZOLAM HCL 2 MG/2ML IJ SOLN
INTRAMUSCULAR | Status: AC
  Filled 2018-03-11: qty 2

## 2018-03-11 MED ORDER — BELLADONNA ALKALOIDS-OPIUM 16.2-60 MG RE SUPP
RECTAL | Status: DC | PRN
  Administered 2018-03-11: 1 via RECTAL

## 2018-03-11 MED ORDER — SODIUM CHLORIDE 0.9 % IV SOLN
INTRAVENOUS | Status: DC | PRN
  Administered 2018-03-11: 100 ug/min via INTRAVENOUS

## 2018-03-11 MED ORDER — DEXAMETHASONE SODIUM PHOSPHATE 10 MG/ML IJ SOLN
INTRAMUSCULAR | Status: DC | PRN
  Administered 2018-03-11: 5 mg via INTRAVENOUS

## 2018-03-11 MED ORDER — KETOROLAC TROMETHAMINE 30 MG/ML IJ SOLN
INTRAMUSCULAR | Status: AC
  Filled 2018-03-11: qty 1

## 2018-03-11 MED ORDER — SUCCINYLCHOLINE CHLORIDE 20 MG/ML IJ SOLN
INTRAMUSCULAR | Status: DC | PRN
  Administered 2018-03-11: 40 mg via INTRAVENOUS

## 2018-03-11 SURGICAL SUPPLY — 19 items
BAG DRAIN URO-CYSTO SKYTR STRL (DRAIN) ×3 IMPLANT
BAG URINE DRAINAGE (UROLOGICAL SUPPLIES) ×3 IMPLANT
CATH COUDE FOLEY 2W 5CC 18FR (CATHETERS) IMPLANT
CATH COUDE FOLEY 2W 5CC 20FR (CATHETERS) ×3 IMPLANT
CATH FOLEY 3WAY 30CC 22F (CATHETERS) IMPLANT
CLOTH BEACON ORANGE TIMEOUT ST (SAFETY) ×3 IMPLANT
GLOVE BIO SURGEON STRL SZ7.5 (GLOVE) ×3 IMPLANT
GOWN STRL REUS W/TWL XL LVL3 (GOWN DISPOSABLE) ×3 IMPLANT
HOLDER FOLEY CATH W/STRAP (MISCELLANEOUS) ×3 IMPLANT
IV NS IRRIG 3000ML ARTHROMATIC (IV SOLUTION) ×21 IMPLANT
KIT TURNOVER CYSTO (KITS) ×3 IMPLANT
LASER REVOLIX PROCEDURE (MISCELLANEOUS) ×3 IMPLANT
LOOP CUT BIPOLAR 24F LRG (ELECTROSURGICAL) IMPLANT
MANIFOLD NEPTUNE II (INSTRUMENTS) ×3 IMPLANT
PACK CYSTO (CUSTOM PROCEDURE TRAY) ×3 IMPLANT
SYR 30ML LL (SYRINGE) IMPLANT
TUBE CONNECTING 12'X1/4 (SUCTIONS)
TUBE CONNECTING 12X1/4 (SUCTIONS) IMPLANT
TUBING UROLOGY SET (TUBING) ×3 IMPLANT

## 2018-03-11 NOTE — Op Note (Signed)
Preoperative diagnosis: BPH with lower urinary tract symptoms, weak stream, frequency Postoperative diagnosis: Same  Procedure: Thulium laser vaporization of the prostate  Surgeon: Junious Silk  Anesthesia: General  Indication for procedure: 78 year old white male with bothersome lower urinary tract symptoms and visual obstruction from median and lateral lobe prostate hypertrophy.  Findings: The patient had a fossa navicularis narrowing with only about an 68 [place] opening.  I had to dilate it to 28 [place] to get the 26 [place] laser continuous flow sheath to pass.  The urethra was unremarkable, there was trilobar hypertrophy with a large median lobe obstructing the prostatic urethra.  The bladder was moderately trabeculated without mucosal lesion.  No stone or foreign body in the bladder.  The ureteral orifice ease were identified and were normal pre-and post laser with good clear efflux.   On digital rectal exam at the end of the case prostate was about 30 g and smooth without hard area or nodule.  Penis was circumcised without mass or lesion and testicles were descended bilaterally and palpably normal.  Description of procedure: After consent was obtained patient brought to the operating room.  After adequate anesthesia he is placed in lithotomy position and prepped and draped in usual sterile fashion.  A timeout was performed to confirm the patient and procedure.  I could not engage the penile urethra with the scope because of narrowing of the fossa navicularis.  Dilators were taken and the 83 [place] passed easily but the 20 through 28 [place] met resistance.  I dilated just enough to get the 26 [place] scope to pass and there was a stricture at the fossa navicularis.  The remainder the urethra was normal.  The prostate was inspected and I inspected the bladder and identified the ureteral orifices.  The 1000 m laser fiber was passed and I made an incision at the 5:00 and 7:00 of the prostate and bladder  neck and brought this down toward the veru in line with the ureteral orifice.  I then vaporized the median lobe.  I then started anterior to posterior from the bladder neck down to the verumontanum and vaporized the right lobe and then the left lobe.  The bladder was drained and inspection noted there to be some residual right lateral tissue and median lobe tissue and this was vaporized.  This created an excellent channel and hemostasis was excellent at low pressure.  The ureteral orifice ease and trigone were inspected again and noted to be normal.  I did not vaporize distal to the verumontanum and he had a great channel.  Therefore the bladder was filled and the scope removed.  Lidocaine jelly was instilled per urethra and a 20 [place] coud catheter was placed in left to gravity drainage.  Irrigation was light pink.  An exam under anesthesia was performed and I placed a B&O suppository.  He was awakened and taken the recovery room in stable condition.  Complications: None  Blood loss: Minimal  Specimens: None  Drains: 20 French coud catheter with 15 cc in the balloon  Disposition: Patient stable to PACU

## 2018-03-11 NOTE — Anesthesia Procedure Notes (Signed)
Procedure Name: LMA Insertion Date/Time: 03/11/2018 8:43 AM Performed by: Wanita Chamberlain, CRNA Pre-anesthesia Checklist: Patient identified, Emergency Drugs available, Suction available and Patient being monitored Patient Re-evaluated:Patient Re-evaluated prior to induction Oxygen Delivery Method: Circle system utilized Preoxygenation: Pre-oxygenation with 100% oxygen Induction Type: IV induction Ventilation: Mask ventilation without difficulty LMA: LMA inserted LMA Size: 4.0 Number of attempts: 1 Placement Confirmation: breath sounds checked- equal and bilateral,  CO2 detector and positive ETCO2 Tube secured with: Tape Dental Injury: Teeth and Oropharynx as per pre-operative assessment

## 2018-03-11 NOTE — Discharge Instructions (Signed)
Indwelling Urinary Catheter Care, Adult An indwelling urinary catheter is a thin tube that is put into your bladder. The tube helps to drain pee (urine) out of your body. The tube goes in through your urethra. Your urethra is where pee comes out of your body. Your pee will come out through the catheter, then it will go into a bag (drainage bag). Take good care of your catheter so it will work well. How to wear your catheter and bag Supplies needed  Sticky tape (adhesive tape) or a leg strap.  Alcohol wipe or soap and water (if you use tape).  A clean towel (if you use tape).  Large overnight bag.  Smaller bag (leg bag). Wearing your catheter Attach your catheter to your leg with tape or a leg strap.  Make sure the catheter is not pulled tight.  If a leg strap gets wet, take it off and put on a dry strap.  If you use tape to hold the bag on your leg: 1. Use an alcohol wipe or soap and water to wash your skin where the tape made it sticky before. 2. Use a clean towel to pat-dry that skin. 3. Use new tape to make the bag stay on your leg. Wearing your bags You should have been given a large overnight bag.  You may wear the overnight bag in the day or night.  Always have the overnight bag lower than your bladder.  Do not let the bag touch the floor.  Before you go to sleep, put a clean plastic bag in a wastebasket. Then hang the overnight bag inside the wastebasket. You should also have a smaller leg bag that fits under your clothes.  Always wear the leg bag below your knee.  Do not wear your leg bag at night. How to care for your skin and catheter Supplies needed  A clean washcloth.  Water and mild soap.  A clean towel. Caring for your skin and catheter      Clean the skin around your catheter every day: ? Wash your hands with soap and water. ? Wet a clean washcloth in warm water and mild soap. ? Clean the skin around your urethra. ? If you are male: ? Gently  spread the folds of skin around your vagina (labia). ? With the washcloth in your other hand, wipe the inner side of your labia on each side. Wipe from front to back. ? If you are male: ? Pull back any skin that covers the end of your penis (foreskin). ? With the washcloth in your other hand, wipe your penis in small circles. Start wiping at the tip of your penis, then move away from the catheter. ? With your free hand, hold the catheter close to where it goes into your body. ? Keep holding the catheter during cleaning so it does not get pulled out. ? With the washcloth in your other hand, clean the catheter. ? Only wipe downward on the catheter. ? Do not wipe upward toward your body. Doing this may push germs into your urethra and cause infection. ? Use a clean towel to pat-dry the catheter and the skin around it. Make sure to wipe off all soap. ? Wash your hands with soap and water.  Shower every day. Do not take baths.  Do not use cream, ointment, or lotion on the area where the catheter goes into your body, unless your doctor tells you to.  Do not use powders, sprays, or lotions  on your genital area.  Check your skin around the catheter every day for signs of infection. Check for: ? Redness, swelling, or pain. ? Fluid or blood. ? Warmth. ? Pus or a bad smell. How to empty the bag Supplies needed  Rubbing alcohol.  Gauze pad or cotton ball.  Tape or a leg strap. Emptying the bag Pour the pee out of your bag when it is ?- full, or at least 2-3 times a day. Do this for your overnight bag and your leg bag. 1. Wash your hands with soap and water. 2. Separate (detach) the bag from your leg. 3. Hold the bag over the toilet or a clean pail. Keep the bag lower than your hips and bladder. This is so the pee (urine) does not go back into the tube. 4. Open the pour spout. It is at the bottom of the bag. 5. Empty the pee into the toilet or pail. Do not let the pour spout touch any  surface. 6. Put rubbing alcohol on a gauze pad or cotton ball. 7. Use the gauze pad or cotton ball to clean the pour spout. 8. Close the pour spout. 9. Attach the bag to your leg with tape or a leg strap. 10. Wash your hands with soap and water. Follow instructions for cleaning the drainage bag:  From the product maker.  As told by your doctor. How to change the bag Supplies needed  Alcohol wipes.  A clean bag.  Tape or a leg strap. Changing the bag Replace your bag with a clean bag once a month. If it starts to leak, smell bad, or look dirty, change it sooner. 1. Wash your hands with soap and water. 2. Separate the dirty bag from your leg. 3. Pinch the catheter with your fingers so that pee does not spill out. 4. Separate the catheter tube from the bag tube where these tubes connect (at the connection valve). Do not let the tubes touch any surface. 5. Clean the end of the catheter tube with an alcohol wipe. Use a different alcohol wipe to clean the end of the bag tube. 6. Connect the catheter tube to the tube of the clean bag. 7. Attach the clean bag to your leg with tape or a leg strap. Do not make the bag tight on your leg. 8. Wash your hands with soap and water. General rules   Never pull on your catheter. Never try to take it out. Doing that can hurt you.  Always wash your hands before and after you touch your catheter or bag. Use a mild, fragrance-free soap. If you do not have soap and water, use hand sanitizer.  Always make sure there are no twists or bends (kinks) in the catheter tube.  Always make sure there are no leaks in the catheter or bag.  Drink enough fluid to keep your pee pale yellow.  Do not take baths, swim, or use a hot tub.  If you are male, wipe from front to back after you poop (have a bowel movement). Contact a doctor if:  Your pee is cloudy.  Your pee smells worse than usual.  Your catheter gets clogged.  Your catheter leaks.  Your  bladder feels full. Get help right away if:  You have redness, swelling, or pain where the catheter goes into your body.  You have fluid, blood, pus, or a bad smell coming from the area where the catheter goes into your body.  Your skin feels warm  where the catheter goes into your body.  You have a fever.  You have pain in your: ? Belly (abdomen). ? Legs. ? Lower back. ? Bladder.  You see blood in the catheter.  Your pee is pink or red.  You feel sick to your stomach (nauseous).  You throw up (vomit).  You have chills.  Your pee is not draining into the bag.  Your catheter gets pulled out. Summary  An indwelling urinary catheter is a thin tube that is placed into the bladder to help drain pee (urine) out of the body.  The catheter is placed into the part of the body that drains pee from the bladder (urethra).  Taking good care of your catheter will keep it working properly and help prevent problems.  Always wash your hands before and after touching your catheter or bag.  Never pull on your catheter or try to take it out.  .  Prostate Laser Surgery, Care After  This sheet gives you information about how to care for yourself after your procedure. Your health care provider may also give you more specific instructions. If you have problems or questions, contact your health care provider. What can I expect after the procedure? For the first few weeks after the procedure:  You will feel a need to urinate often.  You may have blood in your urine.  You may feel a sudden need to urinate. Once your urinary catheter is removed, you may have a burning feeling when you urinate, especially at the end of urination. This feeling usually passes within 3-5 days. Follow these instructions at home: Activity  Return to your normal activities as told by your health care provider. Ask your health care provider what activities are safe for you.  Do not do vigorous exercise for 1  week or as told by your health care provider.  Do not lift anything that is heavier than 10 lb (4.5 kg) until your health care provider say it is safe.  Avoid sexual activity for 4-6 weeks or as told by your health care provider.  Do not ride in a car for extended periods of time for 1 month or as told by your health care provider.  Do not drive for 24 hours if you were given a medicine to help you relax (sedative). Diet  Eat foods that are high in fiber, such as fresh fruits and vegetables, whole grains, and beans.  Drink enough fluid to keep your urine clear or pale yellow. Medicines  Take over-the-counter and prescription medicines, including stool softeners, only as told by your health care provider.  If you were prescribed an antibiotic medicine, take it as told by your health care provider. Do not stop taking the antibiotic even if you start to feel better. General instructions   If you were given elastic support stockings, wear them as told by your health care provider.  Do not strain to have a bowel movement. Straining may lead to bleeding from the prostate and cause clots to form and cause trouble urinating.  Keep all follow-up visits as told by your health care provider. This is important. Contact a health care provider if:  You have a fever or chills.  You have spasms or pain with the urinary catheter still in place.  Once the catheter has been removed, you experience difficulty starting your stream when attempting to urinate. Get help right away if:  There is a blockage in your catheter.  Your catheter has been removed  and you are suddenly unable to urinate.  Your urine smells unusually bad.  You start to have blood clots in your urine.  The blood in your urine becomes persistent or gets thick.  You develop chest pains.  You develop shortness of breath.  You develop swelling or pain in your leg. Summary  You may notice urinary symptoms for a few weeks  after your procedure.  Follow instructions from your health care provider regarding activity restrictions such as lifting, exercise, and sexual activity.  Contact your health care provider if you have any unusual symptoms during your recovery. This information is not intended to replace advice given to you by your health care provider. Make sure you discuss any questions you have with your health care provider. Document Released: 01/12/2005 Document Revised: 08/30/2015 Document Reviewed: 08/30/2015 Elsevier Interactive Patient Education  2019 [facility] Instructions  Activity: Get plenty of rest for the remainder of the day. A responsible individual must stay with you for 24 hours following the procedure.  For the next 24 hours, DO NOT: -Drive a car -Paediatric nurse -Drink alcoholic beverages -Take any medication unless instructed by your physician -Make any legal decisions or sign important papers.  Meals: Start with liquid foods such as gelatin or soup. Progress to regular foods as tolerated. Avoid greasy, spicy, heavy foods. If nausea and/or vomiting occur, drink only clear liquids until the nausea and/or vomiting subsides. Call your physician if vomiting continues.  Special Instructions/Symptoms: Your throat may feel dry or sore from the anesthesia or the breathing tube placed in your throat during surgery. If this causes discomfort, gargle with warm salt water. The discomfort should disappear within 24 hours.  If you had a scopolamine patch placed behind your ear for the management of post- operative nausea and/or vomiting:  1. The medication in the patch is effective for 72 hours, after which it should be removed.  Wrap patch in a tissue and discard in the trash. Wash hands thoroughly with soap and water. 2. You may remove the patch earlier than 72 hours if you experience unpleasant side effects which may include dry mouth, dizziness or visual  disturbances. 3. Avoid touching the patch. Wash your hands with soap and water after contact with the patch.

## 2018-03-11 NOTE — Interval H&P Note (Signed)
History and Physical Interval Note:  03/11/2018 8:35 AM  I was going to add he has had no fever, cough or congestion.  No dysuria or gross hematuria.  Thomas Li

## 2018-03-11 NOTE — Transfer of Care (Signed)
Immediate Anesthesia Transfer of Care Note  Patient: Thomas Li  Procedure(s) Performed: Marcelino Duster LASER TURP (TRANSURETHRAL RESECTION OF PROSTATE) (N/A )  Patient Location: PACU  Anesthesia Type:General  Level of Consciousness: awake, alert , oriented and patient cooperative  Airway & Oxygen Therapy: Patient Spontanous Breathing and Patient connected to nasal cannula oxygen  Post-op Assessment: Report given to RN and Post -op Vital signs reviewed and stable  Post vital signs: Reviewed and stable  Last Vitals:  Vitals Value Taken Time  BP 112/68 03/11/2018  9:55 AM  Temp    Pulse 71 03/11/2018 10:00 AM  Resp 14 03/11/2018 10:00 AM  SpO2 96 % 03/11/2018 10:00 AM  Vitals shown include unvalidated device data.  Last Pain:  Vitals:   03/11/18 0715  TempSrc:   PainSc: 0-No pain      Patients Stated Pain Goal: 5 (57/32/20 2542)  Complications: No apparent anesthesia complications

## 2018-03-11 NOTE — Interval H&P Note (Signed)
History and Physical Interval Note:  03/11/2018 8:34 AM  Thomas Li  has presented today for surgery, with the diagnosis of BENIGN PROSTATIC HYPERPLASIA, WEAK URINARY STREAM  The various methods of treatment have been discussed with the patient and family. After consideration of risks, benefits and other options for treatment, the patient has consented to  Procedure(s): THULIUM LASER TURP (TRANSURETHRAL RESECTION OF PROSTATE) (N/A) as a surgical intervention .  The patient's history has been reviewed, patient examined, no change in status, stable for surgery.  I have reviewed the patient's chart and labs.  I discussed again with the patient and his wife the nature risk benefits and alternatives to thulium laser vaporization of the prostate.  We discussed expectations for flow symptoms and irritative symptoms and that sometimes the irritative symptoms persist or worsen.  Questions were answered to the patient's satisfaction.  He elects to proceed.  Discussed postoperative Foley catheter.   Festus Aloe

## 2018-03-12 NOTE — Anesthesia Postprocedure Evaluation (Signed)
Anesthesia Post Note  Patient: Thomas Li  Procedure(s) Performed: THULIUM LASER TURP (TRANSURETHRAL RESECTION OF PROSTATE) (N/A )     Patient location during evaluation: PACU Anesthesia Type: General Level of consciousness: awake and alert Pain management: pain level controlled Vital Signs Assessment: post-procedure vital signs reviewed and stable Respiratory status: spontaneous breathing, nonlabored ventilation, respiratory function stable and patient connected to nasal cannula oxygen Cardiovascular status: blood pressure returned to baseline and stable Postop Assessment: no apparent nausea or vomiting Anesthetic complications: no    Last Vitals:  Vitals:   03/11/18 1100 03/11/18 1230  BP: 126/70 130/61  Pulse: 64 68  Resp: 17 16  Temp:  36.5 C  SpO2: 96% 98%    Last Pain:  Vitals:   03/11/18 1200  TempSrc:   PainSc: 0-No pain                 Merl Guardino

## 2018-03-14 ENCOUNTER — Encounter (HOSPITAL_BASED_OUTPATIENT_CLINIC_OR_DEPARTMENT_OTHER): Payer: Self-pay | Admitting: Urology

## 2018-03-14 DIAGNOSIS — R3912 Poor urinary stream: Secondary | ICD-10-CM | POA: Diagnosis not present

## 2018-03-14 DIAGNOSIS — N401 Enlarged prostate with lower urinary tract symptoms: Secondary | ICD-10-CM | POA: Diagnosis not present

## 2018-05-24 ENCOUNTER — Telehealth: Payer: Self-pay | Admitting: *Deleted

## 2018-05-24 NOTE — Telephone Encounter (Signed)
Left message with pt's wife to have pt call back to schedule Annual Wellness visit with Dr. Rogers Blocker.

## 2018-06-06 ENCOUNTER — Ambulatory Visit: Payer: Medicare Other | Admitting: Family Medicine

## 2018-06-08 DIAGNOSIS — N401 Enlarged prostate with lower urinary tract symptoms: Secondary | ICD-10-CM | POA: Diagnosis not present

## 2018-06-08 DIAGNOSIS — R350 Frequency of micturition: Secondary | ICD-10-CM | POA: Diagnosis not present

## 2018-06-10 ENCOUNTER — Ambulatory Visit (INDEPENDENT_AMBULATORY_CARE_PROVIDER_SITE_OTHER): Payer: Medicare Other | Admitting: Family Medicine

## 2018-06-10 ENCOUNTER — Other Ambulatory Visit: Payer: Self-pay

## 2018-06-10 ENCOUNTER — Encounter: Payer: Self-pay | Admitting: Family Medicine

## 2018-06-10 VITALS — BP 150/68 | HR 90 | Temp 97.9°F | Ht 66.0 in | Wt 182.8 lb

## 2018-06-10 DIAGNOSIS — E782 Mixed hyperlipidemia: Secondary | ICD-10-CM | POA: Diagnosis not present

## 2018-06-10 DIAGNOSIS — R7303 Prediabetes: Secondary | ICD-10-CM | POA: Diagnosis not present

## 2018-06-10 DIAGNOSIS — I10 Essential (primary) hypertension: Secondary | ICD-10-CM | POA: Diagnosis not present

## 2018-06-10 LAB — CBC WITH DIFFERENTIAL/PLATELET
Basophils Absolute: 0.1 10*3/uL (ref 0.0–0.1)
Basophils Relative: 1.3 % (ref 0.0–3.0)
Eosinophils Absolute: 0.2 10*3/uL (ref 0.0–0.7)
Eosinophils Relative: 3 % (ref 0.0–5.0)
HCT: 40.8 % (ref 39.0–52.0)
Hemoglobin: 14 g/dL (ref 13.0–17.0)
Lymphocytes Relative: 20.6 % (ref 12.0–46.0)
Lymphs Abs: 1.2 10*3/uL (ref 0.7–4.0)
MCHC: 34.3 g/dL (ref 30.0–36.0)
MCV: 105.3 fl — ABNORMAL HIGH (ref 78.0–100.0)
Monocytes Absolute: 0.8 10*3/uL (ref 0.1–1.0)
Monocytes Relative: 15 % — ABNORMAL HIGH (ref 3.0–12.0)
Neutro Abs: 3.4 10*3/uL (ref 1.4–7.7)
Neutrophils Relative %: 60.1 % (ref 43.0–77.0)
Platelets: 238 10*3/uL (ref 150.0–400.0)
RBC: 3.87 Mil/uL — ABNORMAL LOW (ref 4.22–5.81)
RDW: 14 % (ref 11.5–15.5)
WBC: 5.7 10*3/uL (ref 4.0–10.5)

## 2018-06-10 LAB — COMPREHENSIVE METABOLIC PANEL
ALT: 41 U/L (ref 0–53)
AST: 23 U/L (ref 0–37)
Albumin: 4.3 g/dL (ref 3.5–5.2)
Alkaline Phosphatase: 59 U/L (ref 39–117)
BUN: 15 mg/dL (ref 6–23)
CO2: 27 mEq/L (ref 19–32)
Calcium: 8.8 mg/dL (ref 8.4–10.5)
Chloride: 104 mEq/L (ref 96–112)
Creatinine, Ser: 0.96 mg/dL (ref 0.40–1.50)
GFR: 76.03 mL/min (ref >60.00)
Glucose, Bld: 132 mg/dL — ABNORMAL HIGH (ref 70–99)
Potassium: 4.3 mEq/L (ref 3.5–5.1)
Sodium: 139 mEq/L (ref 135–145)
Total Bilirubin: 0.5 mg/dL (ref 0.2–1.2)
Total Protein: 6.5 g/dL (ref 6.0–8.3)

## 2018-06-10 LAB — LIPID PANEL
Cholesterol: 120 mg/dL (ref 0–200)
HDL: 46.1 mg/dL (ref >39.00)
LDL Cholesterol: 35 mg/dL (ref 0–99)
NonHDL: 73.5
Total CHOL/HDL Ratio: 3
Triglycerides: 193 mg/dL — ABNORMAL HIGH (ref 0.0–149.0)
VLDL: 38.6 mg/dL (ref 0.0–40.0)

## 2018-06-10 LAB — HEMOGLOBIN A1C: Hgb A1c MFr Bld: 6.3 % (ref 4.6–6.5)

## 2018-06-10 NOTE — Progress Notes (Signed)
Patient: Thomas Li MRN: [id number] DOB: [birth year] PCP: Orma Flaming, MD     Subjective:  Chief Complaint  Patient presents with  . Hypertension    HPI: The patient is a 78 y.o. male who presents today for hypertension follow up.   Hypertension: Here for follow up of hypertension.  Currently on losartan 100mg  and newly added norvasc 2.5mg  . Home readings range from 030 SPQZRAQT/62 diastolic. Takes medication as prescribed and denies any side effects. Exercise includes walking. Weight has been stable. Denies any chest pain, headaches, shortness of breath, vision changes, swelling in lower extremities. He was supposed to increase his norvasc to 5mg  if over 140/90, but has continued with the 1/2 pill.   Hyperlipidemia: I also started him on crestor recently. Tolerating well. No side effects. Started to due to elevated ASCVD risk of 48%. High sensitivity crp was normal. WE had discussion regarding this and due to HTN, age, pre diabetes and risk we started the statin.   Prediabetes: due for a1c check up. No active weight loss and diet same. Last a1c was 6.2.   Review of Systems  Constitutional: Negative for fatigue.  Eyes: Negative for visual disturbance.  Respiratory: Negative for shortness of breath.   Cardiovascular: Negative for chest pain.  Gastrointestinal: Negative for abdominal pain and nausea.  Neurological: Negative for dizziness and headaches.  Psychiatric/Behavioral: Negative for sleep disturbance.    Allergies Patient is allergic to lisinopril.  Past Medical History Patient  has a past medical history of Abnormal EKG, Chronic combined systolic and diastolic heart failure ([place]) (26/33/3545), Complication of anesthesia, Hypercholesterolemia, Hypertension, Nocturnal polyuria, Plantar fasciitis, PONV (postoperative nausea and vomiting), and Weak urinary stream.  Surgical History Patient  has a past surgical history that includes SKIN CANCER AREAS REMOVED; ALC REPAIR;  TEAR DUCT SURGERY (5 YRS AGO); and Thulium laser TURP (transurethral resection of prostate) (N/A, 03/11/2018).  Family History Pateint's family history includes Heart disease (age of onset: 56) in his father.  Social History Patient  reports that he quit smoking about 50 years ago. He has never used smokeless tobacco. He reports current alcohol use. He reports that he does not use drugs.    Objective: Vitals:   06/10/18 0812 06/10/18 0842  BP: (!) 176/92 (!) 150/68  Pulse: 90   Temp: 97.9 F (36.6 C)   TempSrc: Oral   SpO2: 97%   Weight: 182 lb 12.8 oz (82.9 kg)   Height: 5\' 6"  (1.676 m)     Body mass index is 29.5 kg/m.  Physical Exam Vitals signs reviewed.  Constitutional:      Appearance: He is obese.  Neck:     Musculoskeletal: Normal range of motion and neck supple.  Cardiovascular:     Rate and Rhythm: Normal rate and regular rhythm.     Heart sounds: Normal heart sounds.  Pulmonary:     Effort: Pulmonary effort is normal.     Breath sounds: Normal breath sounds.  Abdominal:     General: Abdomen is flat. Bowel sounds are normal.     Palpations: Abdomen is soft.  Skin:    General: Skin is warm and dry.     Capillary Refill: Capillary refill takes less than 2 seconds.  Neurological:     General: No focal deficit present.     Mental Status: He is alert and oriented to person, place, and time.  Psychiatric:        Mood and Affect: Mood normal.  Behavior: Behavior normal.        Assessment/plan: 1. Essential hypertension Much improved from last visit and we have him near goal. He was supposed to increase his norvasc to 5mg  and did not do this. He is not keen on adjusting much of his medication as he feels great and is happy to see his numbers so consistent. Discussed I would like to see him 140/90 or less with his risk factors and he is okay to increase his norvasc to 5mg . Will continue losartan and bump this up to 5mg . Continue home log. See him back in 6  months for routine f/u. Routine labs today.  - Comprehensive metabolic panel - CBC with Differential/Platelet  2. Pre-diabetes a1c today.  - Hemoglobin A1c  3. Mixed hyperlipidemia Tolerating crestor well. Repeat lipid/liver enzymes today.  - Lipid panel   Return in about 6 months (around 12/11/2018) for awv/htn/labs .   Orma Flaming, MD [place]   06/10/2018

## 2018-06-10 NOTE — Patient Instructions (Signed)
Increase your amlodipine to 5mg  (take the full pill) and continue your losartan. Labs today and see you back in 6 months!   So good to see you!

## 2018-06-29 ENCOUNTER — Telehealth: Payer: Self-pay | Admitting: Family Medicine

## 2018-06-29 NOTE — Telephone Encounter (Signed)
See note  Copied from [place] [phone]. Topic: General - Inquiry >> Jun 29, 2018 10:36 AM Yvette Rack wrote: Reason for CRM: Pt wife called to request that pt most recent lab results be mailed to their home.

## 2018-06-29 NOTE — Telephone Encounter (Signed)
Copies of labs from 5/15 mailed to patient's home address per wife's request.

## 2018-08-30 DIAGNOSIS — R972 Elevated prostate specific antigen [PSA]: Secondary | ICD-10-CM | POA: Diagnosis not present

## 2018-09-02 DIAGNOSIS — H11022 Central pterygium of left eye: Secondary | ICD-10-CM | POA: Diagnosis not present

## 2018-09-02 DIAGNOSIS — H1045 Other chronic allergic conjunctivitis: Secondary | ICD-10-CM | POA: Diagnosis not present

## 2018-09-02 DIAGNOSIS — H04123 Dry eye syndrome of bilateral lacrimal glands: Secondary | ICD-10-CM | POA: Diagnosis not present

## 2018-09-02 DIAGNOSIS — H402231 Chronic angle-closure glaucoma, bilateral, mild stage: Secondary | ICD-10-CM | POA: Diagnosis not present

## 2018-09-06 DIAGNOSIS — R972 Elevated prostate specific antigen [PSA]: Secondary | ICD-10-CM | POA: Diagnosis not present

## 2018-09-06 DIAGNOSIS — N401 Enlarged prostate with lower urinary tract symptoms: Secondary | ICD-10-CM | POA: Diagnosis not present

## 2018-09-06 DIAGNOSIS — R3915 Urgency of urination: Secondary | ICD-10-CM | POA: Diagnosis not present

## 2018-09-06 DIAGNOSIS — R350 Frequency of micturition: Secondary | ICD-10-CM | POA: Diagnosis not present

## 2018-11-08 DIAGNOSIS — Z23 Encounter for immunization: Secondary | ICD-10-CM | POA: Diagnosis not present

## 2018-12-08 DIAGNOSIS — H2513 Age-related nuclear cataract, bilateral: Secondary | ICD-10-CM | POA: Diagnosis not present

## 2018-12-08 DIAGNOSIS — H402231 Chronic angle-closure glaucoma, bilateral, mild stage: Secondary | ICD-10-CM | POA: Diagnosis not present

## 2018-12-08 DIAGNOSIS — H35373 Puckering of macula, bilateral: Secondary | ICD-10-CM | POA: Diagnosis not present

## 2018-12-08 DIAGNOSIS — H353131 Nonexudative age-related macular degeneration, bilateral, early dry stage: Secondary | ICD-10-CM | POA: Diagnosis not present

## 2018-12-12 ENCOUNTER — Ambulatory Visit: Payer: Medicare Other | Admitting: Family Medicine

## 2018-12-12 ENCOUNTER — Ambulatory Visit: Payer: Medicare Other

## 2018-12-14 ENCOUNTER — Encounter: Payer: Self-pay | Admitting: Family Medicine

## 2018-12-14 DIAGNOSIS — H402230 Chronic angle-closure glaucoma, bilateral, stage unspecified: Secondary | ICD-10-CM | POA: Insufficient documentation

## 2018-12-18 ENCOUNTER — Other Ambulatory Visit: Payer: Self-pay | Admitting: Family Medicine

## 2019-02-03 ENCOUNTER — Other Ambulatory Visit: Payer: Self-pay | Admitting: Family Medicine

## 2019-02-09 ENCOUNTER — Ambulatory Visit: Payer: Medicare Other | Attending: Internal Medicine

## 2019-02-09 DIAGNOSIS — Z20822 Contact with and (suspected) exposure to covid-19: Secondary | ICD-10-CM | POA: Diagnosis not present

## 2019-02-10 LAB — NOVEL CORONAVIRUS, NAA: SARS-CoV-2, NAA: DETECTED — AB

## 2019-02-11 ENCOUNTER — Other Ambulatory Visit: Payer: Self-pay | Admitting: Unknown Physician Specialty

## 2019-02-11 ENCOUNTER — Telehealth: Payer: Self-pay | Admitting: Unknown Physician Specialty

## 2019-02-11 DIAGNOSIS — I10 Essential (primary) hypertension: Secondary | ICD-10-CM

## 2019-02-11 DIAGNOSIS — U071 COVID-19: Secondary | ICD-10-CM

## 2019-02-11 NOTE — Telephone Encounter (Signed)
  I connected by phone with Thomas Li on 02/11/2019 at 11:09 AM to discuss the potential use of an new treatment for mild to moderate COVID-19 viral infection in non-hospitalized patients.  This patient is a 79 y.o. male that meets the FDA criteria for Emergency Use Authorization of bamlanivimab or casirivimab\imdevimab.  Has a (+) direct SARS-CoV-2 viral test result  Has mild or moderate COVID-19   Is ? 79 years of age and weighs ? 40 kg  Is NOT hospitalized due to COVID-19  Is NOT requiring oxygen therapy or requiring an increase in baseline oxygen flow rate due to COVID-19  Is within 10 days of symptom onset  Has at least one of the high risk factor(s) for progression to severe COVID-19 and/or hospitalization as defined in EUA.  Specific high risk criteria : >/= 78 yo with comorbid conditions treated by primary care   I have spoken and communicated the following to the patient or parent/caregiver:  1. FDA has authorized the emergency use of bamlanivimab and casirivimab\imdevimab for the treatment of mild to moderate COVID-19 in adults and pediatric patients with positive results of direct SARS-CoV-2 viral testing who are 37 years of age and older weighing at least 40 kg, and who are at high risk for progressing to severe COVID-19 and/or hospitalization.  2. The significant known and potential risks and benefits of bamlanivimab and casirivimab\imdevimab, and the extent to which such potential risks and benefits are unknown.  3. Information on available alternative treatments and the risks and benefits of those alternatives, including clinical trials.  4. Patients treated with bamlanivimab and casirivimab\imdevimab should continue to self-isolate and use infection control measures (e.g., wear mask, isolate, social distance, avoid sharing personal items, clean and disinfect "high touch" surfaces, and frequent handwashing) according to CDC guidelines.   5. The patient or  parent/caregiver has the option to accept or refuse bamlanivimab or casirivimab\imdevimab .  After reviewing this information with the patient, The patient agreed to proceed with receiving the bamlanimivab infusion and will be provided a copy of the Fact sheet prior to receiving the infusion.[name] 02/11/2019 11:09 AM

## 2019-02-13 ENCOUNTER — Ambulatory Visit (HOSPITAL_COMMUNITY)
Admission: RE | Admit: 2019-02-13 | Discharge: 2019-02-13 | Disposition: A | Discharge status: Home / Self Care | Payer: Medicare Other | Source: Ambulatory Visit | Attending: Pulmonary Disease | Admitting: Pulmonary Disease

## 2019-02-13 DIAGNOSIS — Z23 Encounter for immunization: Secondary | ICD-10-CM | POA: Insufficient documentation

## 2019-02-13 DIAGNOSIS — U071 COVID-19: Secondary | ICD-10-CM | POA: Diagnosis not present

## 2019-02-13 DIAGNOSIS — I10 Essential (primary) hypertension: Secondary | ICD-10-CM

## 2019-02-13 MED ORDER — METHYLPREDNISOLONE SODIUM SUCC 125 MG IJ SOLR: 125.0000 mg | Freq: Once | INTRAMUSCULAR | Status: DC | PRN

## 2019-02-13 MED ORDER — FAMOTIDINE IN NACL 20-0.9 MG/50ML-% IV SOLN: 20.0000 mg | Freq: Once | INTRAVENOUS | Status: DC | PRN

## 2019-02-13 MED ORDER — SODIUM CHLORIDE 0.9 % IV SOLN
INTRAVENOUS | Status: DC | PRN
  Administered 2019-02-13: 250 mL via INTRAVENOUS

## 2019-02-13 MED ORDER — SODIUM CHLORIDE 0.9 % IV SOLN
700.0000 mg | Freq: Once | INTRAVENOUS | Status: AC
  Administered 2019-02-13: 700 mg via INTRAVENOUS
  Filled 2019-02-13: qty 20

## 2019-02-13 MED ORDER — EPINEPHRINE 0.3 MG/0.3ML IJ SOAJ: 0.3000 mg | Freq: Once | INTRAMUSCULAR | Status: DC | PRN

## 2019-02-13 MED ORDER — DIPHENHYDRAMINE HCL 50 MG/ML IJ SOLN: 50.0000 mg | Freq: Once | INTRAMUSCULAR | Status: DC | PRN

## 2019-02-13 MED ORDER — ALBUTEROL SULFATE HFA 108 (90 BASE) MCG/ACT IN AERS: 2.0000 | INHALATION_SPRAY | Freq: Once | RESPIRATORY_TRACT | Status: DC | PRN

## 2019-02-13 NOTE — Progress Notes (Signed)
  Diagnosis: COVID-19  Physician: Dr. Joya Gaskins  Procedure: [facility] Med: bamlanivimab infusion - Provided patient with bamlanimivab fact sheet for patients, parents and caregivers prior to infusion.  Complications: No immediate complications noted.  Discharge: Discharged home   Thomas Li 02/13/2019

## 2019-02-13 NOTE — Discharge Instructions (Signed)
10 Things You Can Do to Manage Your COVID-19 Symptoms at Home If you have possible or confirmed COVID-19: 1. Stay home from work and school. And stay away from other public places. If you must go out, avoid using any kind of public transportation, ridesharing, or taxis. 2. Monitor your symptoms carefully. If your symptoms get worse, call your healthcare provider immediately. 3. Get rest and stay hydrated. 4. If you have a medical appointment, call the healthcare provider ahead of time and tell them that you have or may have COVID-19. 5. For medical emergencies, call 911 and notify the dispatch personnel that you have or may have COVID-19. 6. Cover your cough and sneezes with a tissue or use the inside of your elbow. 7. Wash your hands often with soap and water for at least 20 seconds or clean your hands with an alcohol-based hand sanitizer that contains at least 60% alcohol. 8. As much as possible, stay in a specific room and away from other people in your home. Also, you should use a separate bathroom, if available. If you need to be around other people in or outside of the home, wear a mask. 9. Avoid sharing personal items with other people in your household, like dishes, towels, and bedding. 10. Clean all surfaces that are touched often, like counters, tabletops, and doorknobs. Use household cleaning sprays or wipes according to the label instructions. [email or web address] 07/27/2018 This information is not intended to replace advice given to you by your health care provider. Make sure you discuss any questions you have with your health care provider. Document Revised: 12/29/2018 Document Reviewed: 12/29/2018 Elsevier Patient Education  [place]. What types of side effects do monoclonal antibody drugs cause?  Common side effects  In general, the more common side effects caused by monoclonal antibody drugs include: . Allergic reactions, such as hives or itching . Flu-like signs and  symptoms, including chills, fatigue, fever, and muscle aches and pains . Nausea, vomiting . Diarrhea . Skin rashes . Low blood pressure   The CDC is recommending patients who receive monoclonal antibody treatments wait at least 90 days before being vaccinated.  Currently, there are no data on the safety and efficacy of mRNA COVID-19 vaccines in persons who received monoclonal antibodies or convalescent plasma as part of COVID-19 treatment. Based on the estimated half-life of such therapies as well as evidence suggesting that reinfection is uncommon in the 90 days after initial infection, vaccination should be deferred for at least 90 days, as a precautionary measure until additional information becomes available, to avoid interference of the antibody treatment with vaccine-induced immune responses. What types of side effects do monoclonal antibody drugs cause?  Common side effects  In general, the more common side effects caused by monoclonal antibody drugs include: . Allergic reactions, such as hives or itching . Flu-like signs and symptoms, including chills, fatigue, fever, and muscle aches and pains . Nausea, vomiting . Diarrhea . Skin rashes . Low blood pressure   The CDC is recommending patients who receive monoclonal antibody treatments wait at least 90 days before being vaccinated.  Currently, there are no data on the safety and efficacy of mRNA COVID-19 vaccines in persons who received monoclonal antibodies or convalescent plasma as part of COVID-19 treatment. Based on the estimated half-life of such therapies as well as evidence suggesting that reinfection is uncommon in the 90 days after initial infection, vaccination should be deferred for at least 90 days, as a precautionary measure until additional information  additional information becomes available, to avoid interference of the antibody treatment with vaccine-induced immune responses. 

## 2019-03-19 ENCOUNTER — Other Ambulatory Visit: Payer: Self-pay | Admitting: Family Medicine

## 2019-06-10 ENCOUNTER — Other Ambulatory Visit: Payer: Self-pay | Admitting: Family Medicine

## 2019-07-27 ENCOUNTER — Other Ambulatory Visit: Payer: Self-pay | Admitting: Family Medicine

## 2019-07-27 MED ORDER — AMLODIPINE BESYLATE 5 MG PO TABS: 5.0000 mg | ORAL_TABLET | Freq: Every day | ORAL | 0 refills | Status: DC

## 2019-08-20 ENCOUNTER — Other Ambulatory Visit: Payer: Self-pay | Admitting: Family Medicine

## 2019-08-25 ENCOUNTER — Other Ambulatory Visit: Payer: Self-pay | Admitting: Family Medicine

## 2019-09-07 DIAGNOSIS — H402231 Chronic angle-closure glaucoma, bilateral, mild stage: Secondary | ICD-10-CM | POA: Diagnosis not present

## 2019-09-27 ENCOUNTER — Encounter: Payer: Self-pay | Admitting: Family Medicine

## 2019-09-27 ENCOUNTER — Other Ambulatory Visit: Payer: Self-pay

## 2019-09-27 ENCOUNTER — Ambulatory Visit (INDEPENDENT_AMBULATORY_CARE_PROVIDER_SITE_OTHER): Payer: Medicare Other | Admitting: Family Medicine

## 2019-09-27 VITALS — BP 150/70 | HR 97 | Temp 97.8°F | Ht 66.0 in | Wt 179.0 lb

## 2019-09-27 DIAGNOSIS — K219 Gastro-esophageal reflux disease without esophagitis: Secondary | ICD-10-CM | POA: Diagnosis not present

## 2019-09-27 DIAGNOSIS — Z1159 Encounter for screening for other viral diseases: Secondary | ICD-10-CM | POA: Diagnosis not present

## 2019-09-27 DIAGNOSIS — E7800 Pure hypercholesterolemia, unspecified: Secondary | ICD-10-CM | POA: Diagnosis not present

## 2019-09-27 DIAGNOSIS — I10 Essential (primary) hypertension: Secondary | ICD-10-CM

## 2019-09-27 DIAGNOSIS — R202 Paresthesia of skin: Secondary | ICD-10-CM

## 2019-09-27 DIAGNOSIS — R7303 Prediabetes: Secondary | ICD-10-CM

## 2019-09-27 MED ORDER — FAMOTIDINE 20 MG PO TABS: 20.0000 mg | ORAL_TABLET | Freq: Every day | ORAL | 1 refills | Status: DC

## 2019-09-27 MED ORDER — AMLODIPINE BESYLATE 5 MG PO TABS: 5.0000 mg | ORAL_TABLET | Freq: Every day | ORAL | 1 refills | Status: DC

## 2019-09-27 NOTE — Patient Instructions (Signed)
Starting you on pepcid to take in the AM before you eat to help GERD symptoms. Can take as needed as well.   Start flonase at night, everynight ot see if this helps post nasal drip symptoms in the am.   Food Choices for Gastroesophageal Reflux Disease, Adult When you have gastroesophageal reflux disease (GERD), the foods you eat and your eating habits are very important. Choosing the right foods can help ease your discomfort. Think about working with a nutrition specialist (dietitian) to help you make good choices. What are tips for following this plan?  Meals  Choose healthy foods that are low in fat, such as fruits, vegetables, whole grains, low-fat dairy products, and lean meat, fish, and poultry.  Eat small meals often instead of 3 large meals a day. Eat your meals slowly, and in a place where you are relaxed. Avoid bending over or lying down until 2-3 hours after eating.  Avoid eating meals 2-3 hours before bed.  Avoid drinking a lot of liquid with meals.  Cook foods using methods other than frying. Bake, grill, or broil food instead.  Avoid or limit: ? Chocolate. ? Peppermint or spearmint. ? Alcohol. ? Pepper. ? Black and decaffeinated coffee. ? Black and decaffeinated tea. ? Bubbly (carbonated) soft drinks. ? Caffeinated energy drinks and soft drinks.  Limit high-fat foods such as: ? Fatty meat or fried foods. ? Whole milk, cream, butter, or ice cream. ? Nuts and nut butters. ? Pastries, donuts, and sweets made with butter or shortening.  Avoid foods that cause symptoms. These foods may be different for everyone. Common foods that cause symptoms include: ? Tomatoes. ? Oranges, lemons, and limes. ? Peppers. ? Spicy food. ? Onions and garlic. ? Vinegar. Lifestyle  Maintain a healthy weight. Ask your doctor what weight is healthy for you. If you need to lose weight, work with your doctor to do so safely.  Exercise for at least 30 minutes for 5 or more days each  week, or as told by your doctor.  Wear loose-fitting clothes.  Do not smoke. If you need help quitting, ask your doctor.  Sleep with the head of your bed higher than your feet. Use a wedge under the mattress or blocks under the bed frame to raise the head of the bed. Summary  When you have gastroesophageal reflux disease (GERD), food and lifestyle choices are very important in easing your symptoms.  Eat small meals often instead of 3 large meals a day. Eat your meals slowly, and in a place where you are relaxed.  Limit high-fat foods such as fatty meat or fried foods.  Avoid bending over or lying down until 2-3 hours after eating.  Avoid peppermint and spearmint, caffeine, alcohol, and chocolate. This information is not intended to replace advice given to you by your health care provider. Make sure you discuss any questions you have with your health care provider. Document Revised: 05/05/2018 Document Reviewed: 02/18/2016 Elsevier Patient Education  [place].

## 2019-09-27 NOTE — Progress Notes (Signed)
Patient: Thomas Li MRN: [id number] DOB: April 16, 1940 PCP: Orma Flaming, MD     Subjective:  Chief Complaint  Patient presents with  . Hypertension  . Hyperlipidemia  . Prediabetes  . Nasal Congestion  . tingling in toes    HPI: The patient is a 79 y.o. male who presents today for HTN and Hyperlipidemia. Pt has no concerns today.  Hypertension: Here for follow up of hypertension.  Currently on cozaar 100mg  and norvasc 5mg  daily . Home readings range from 030 [id number]/62-26 diastolic. Takes medication as prescribed and denies any side effects. Exercise includes walking on treadmill. Weight has been stable. Denies any chest pain, headaches, shortness of breath, vision changes, swelling in lower extremities.   Hyperlipidemia Currently on crestor and tolerating well. Did eat this AM. ASCVD risk was 48% and therapy was initiated.   Prediabetes Due for a1c. Last check was in 05/2018 and was 6.3. to goal for age off medication.   Sore throat Has a lot of excessive phlegm in his throat that is worse in the AM. He states he has to excessively clear his throat. It is not sore and he denies any cough. Has not used any medication. He does have complaints of some GERD as well.   Toe tingling He states his toes feel tingling pretty much all of the time. He has no issues with walking. Denies any pain and has feeling (can feel hot water).  psa followed by urology.   Has had covid with infusion.   Review of Systems  Constitutional: Negative for chills, fatigue and fever.  HENT: Negative for congestion, dental problem, ear pain, hearing loss and trouble swallowing.   Eyes: Negative for visual disturbance.  Respiratory: Negative for cough, chest tightness and shortness of breath.   Cardiovascular: Negative for chest pain, palpitations and leg swelling.  Gastrointestinal: Negative for abdominal pain, blood in stool, diarrhea and nausea.  Endocrine: Negative for cold intolerance,  polydipsia, polyphagia and polyuria.  Genitourinary: Positive for frequency. Negative for dysuria, hematuria, penile pain and testicular pain.  Musculoskeletal: Negative for arthralgias.  Skin: Negative for rash.  Neurological: Negative for dizziness and headaches.       Tingling in bilateral toes   Psychiatric/Behavioral: Negative for dysphoric mood and sleep disturbance. The patient is not nervous/anxious.     Allergies Patient is allergic to lisinopril.  Past Medical History Patient  has a past medical history of Abnormal EKG, Chronic combined systolic and diastolic heart failure ([place]) (33/35/4562), Complication of anesthesia, Hypercholesterolemia, Hypertension, Nocturnal polyuria, Plantar fasciitis, PONV (postoperative nausea and vomiting), and Weak urinary stream.  Surgical History Patient  has a past surgical history that includes SKIN CANCER AREAS REMOVED; ALC REPAIR; TEAR DUCT SURGERY (5 YRS AGO); and Thulium laser TURP (transurethral resection of prostate) (N/A, 03/11/2018).  Family History Pateint's family history includes Heart disease (age of onset: 49) in his father.  Social History Patient  reports that he quit smoking about 51 years ago. He has never used smokeless tobacco. He reports current alcohol use. He reports that he does not use drugs.    Objective: Vitals:   09/27/19 0922 09/27/19 0957  BP: (!) 164/88 (!) 150/70  Pulse: 97   Temp: 97.8 F (36.6 C)   TempSrc: Temporal   SpO2: 95%   Weight: 179 lb (81.2 kg)   Height: 5\' 6"  (1.676 m)     Body mass index is 28.89 kg/m.  Physical Exam Vitals reviewed.  Constitutional:      Appearance: Normal  appearance. He is well-developed. He is obese.  HENT:     Head: Normocephalic and atraumatic.     Right Ear: Tympanic membrane, ear canal and external ear normal.     Left Ear: Tympanic membrane, ear canal and external ear normal.     Mouth/Throat:     Comments: +cobblestoning on posterior pharynx  Eyes:      Extraocular Movements: Extraocular movements intact.     Conjunctiva/sclera: Conjunctivae normal.     Pupils: Pupils are equal, round, and reactive to light.  Neck:     Thyroid: No thyromegaly.  Cardiovascular:     Rate and Rhythm: Normal rate and regular rhythm.     Heart sounds: Murmur heard.   Pulmonary:     Effort: Pulmonary effort is normal.     Breath sounds: Normal breath sounds.  Abdominal:     General: Bowel sounds are normal. There is no distension.     Palpations: Abdomen is soft.     Tenderness: There is no abdominal tenderness.  Musculoskeletal:     Cervical back: Normal range of motion and neck supple.  Lymphadenopathy:     Cervical: No cervical adenopathy.  Skin:    General: Skin is warm and dry.     Capillary Refill: Capillary refill takes less than 2 seconds.     Findings: No rash.  Neurological:     General: No focal deficit present.     Mental Status: He is alert and oriented to person, place, and time.     Cranial Nerves: No cranial nerve deficit.     Coordination: Coordination normal.     Deep Tendon Reflexes: Reflexes normal.     Comments: Foot exam: proprioception intact. Sensation intact. Foot exam normal   Psychiatric:        Mood and Affect: Mood normal.        Behavior: Behavior normal.    Diabetic Foot Exam - Simple   Simple Foot Form Diabetic Foot exam was performed with the following findings: Yes 09/27/2019 10:41 AM  Visual Inspection No deformities, no ulcerations, no other skin breakdown bilaterally: Yes Sensation Testing Intact to touch and monofilament testing bilaterally: Yes Pulse Check Posterior Tibialis and Dorsalis pulse intact bilaterally: Yes Comments Proprioception intact bilaterally          Office Visit from 09/27/2019 in [place]  PHQ-2 Total Score 0      Assessment/plan: 1. Essential hypertension All home readings to goal. Today slightly elevated and may have some white coat component.  Discussed with his grade I diastolic dysfunction I want to make sure his HTN stays controlled. Continue current medication. Routine labs today. F/u in 6 months.  - Comprehensive metabolic panel; Future - CBC with Differential/Platelet; Future - Microalbumin / creatinine urine ratio; Future  2. Hypercholesterolemia Doing well on crestor. No refills needed. Labs today.  - Lipid panel; Future  3. Pre-diabetes  - Hemoglobin A1c; Future  4. Encounter for hepatitis C screening test for low risk patient  - Hepatitis C antibody; Future  5. Gastroesophageal reflux disease, unspecified whether esophagitis present Starting him on pepcid qam. Follow up for regular follow up. Also had GI appointment this month.   6. 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 Foot exam is normal with good pulses.  Checking labs and will monitor since no pain, no falls, no loss of sensation. He is to let me know if symptoms worsen.  - Vitamin B12; Future - TSH; Future  psa followed by urology.  This visit occurred during the SARS-CoV-2 public health emergency.  Safety protocols were in place, including screening questions prior to the visit, additional usage of staff PPE, and extensive cleaning of exam room while observing appropriate contact time as indicated for disinfecting solutions.    Return in about 6 months (around 03/26/2020) for htn/tingling/gerd .     Orma Flaming, MD [place]   09/27/2019

## 2019-09-28 LAB — CBC WITH DIFFERENTIAL/PLATELET
Absolute Monocytes: 477 cells/uL (ref 200–950)
Basophils Absolute: 50 cells/uL (ref 0–200)
Basophils Relative: 1.1 %
Eosinophils Absolute: 108 cells/uL (ref 15–500)
Eosinophils Relative: 2.4 %
HCT: 43.4 % (ref 38.5–50.0)
Hemoglobin: 14.8 g/dL (ref 13.2–17.1)
Lymphs Abs: 914 cells/uL (ref 850–3900)
MCH: 35.2 pg — ABNORMAL HIGH (ref 27.0–33.0)
MCHC: 34.1 g/dL (ref 32.0–36.0)
MCV: 103.3 fL — ABNORMAL HIGH (ref 80.0–100.0)
MPV: 10.6 fL (ref 7.5–12.5)
Monocytes Relative: 10.6 %
Neutro Abs: 2952 cells/uL (ref 1500–7800)
Neutrophils Relative %: 65.6 %
Platelets: 241 10*3/uL (ref 140–400)
RBC: 4.2 10*6/uL (ref 4.20–5.80)
RDW: 12.4 % (ref 11.0–15.0)
Total Lymphocyte: 20.3 %
WBC: 4.5 10*3/uL (ref 3.8–10.8)

## 2019-09-28 LAB — COMPREHENSIVE METABOLIC PANEL
AG Ratio: 2.4 (calc) (ref 1.0–2.5)
ALT: 30 U/L (ref 9–46)
AST: 20 U/L (ref 10–35)
Albumin: 4.4 g/dL (ref 3.6–5.1)
Alkaline phosphatase (APISO): 48 U/L (ref 35–144)
BUN: 11 mg/dL (ref 7–25)
CO2: 27 mmol/L (ref 20–32)
Calcium: 9.2 mg/dL (ref 8.6–10.3)
Chloride: 103 mmol/L (ref 98–110)
Creat: 0.9 mg/dL (ref 0.70–1.18)
Globulin: 1.8 g/dL (calc) — ABNORMAL LOW (ref 1.9–3.7)
Glucose, Bld: 151 mg/dL — ABNORMAL HIGH (ref 65–99)
Potassium: 4.3 mmol/L (ref 3.5–5.3)
Sodium: 140 mmol/L (ref 135–146)
Total Bilirubin: 0.4 mg/dL (ref 0.2–1.2)
Total Protein: 6.2 g/dL (ref 6.1–8.1)

## 2019-09-28 LAB — LIPID PANEL
Cholesterol: 116 mg/dL (ref <200)
HDL: 45 mg/dL (ref >=40)
LDL Cholesterol (Calc): 40 mg/dL (calc)
Non-HDL Cholesterol (Calc): 71 mg/dL (calc) (ref <130)
Total CHOL/HDL Ratio: 2.6 (calc) (ref <5.0)
Triglycerides: 246 mg/dL — ABNORMAL HIGH (ref <150)

## 2019-09-28 LAB — HEMOGLOBIN A1C
Hgb A1c MFr Bld: 6 % of total Hgb — ABNORMAL HIGH (ref <5.7)
Mean Plasma Glucose: 126 (calc)
eAG (mmol/L): 7 (calc)

## 2019-09-28 LAB — VITAMIN B12: Vitamin B-12: 603 pg/mL (ref 200–1100)

## 2019-09-28 LAB — MICROALBUMIN / CREATININE URINE RATIO
Creatinine, Urine: 26 mg/dL (ref 20–320)
Microalb Creat Ratio: 12 mcg/mg creat (ref <30)
Microalb, Ur: 0.3 mg/dL

## 2019-09-28 LAB — HEPATITIS C ANTIBODY
Hepatitis C Ab: NONREACTIVE
SIGNAL TO CUT-OFF: 0.01 (ref <1.00)

## 2019-09-28 LAB — TSH: TSH: 3.08 mIU/L (ref 0.40–4.50)

## 2019-10-06 DIAGNOSIS — Z1159 Encounter for screening for other viral diseases: Secondary | ICD-10-CM | POA: Diagnosis not present

## 2019-10-10 DIAGNOSIS — Z1211 Encounter for screening for malignant neoplasm of colon: Secondary | ICD-10-CM | POA: Diagnosis not present

## 2019-10-10 DIAGNOSIS — K5730 Diverticulosis of large intestine without perforation or abscess without bleeding: Secondary | ICD-10-CM | POA: Diagnosis not present

## 2019-10-11 DIAGNOSIS — N401 Enlarged prostate with lower urinary tract symptoms: Secondary | ICD-10-CM | POA: Diagnosis not present

## 2019-10-18 DIAGNOSIS — N401 Enlarged prostate with lower urinary tract symptoms: Secondary | ICD-10-CM | POA: Diagnosis not present

## 2019-10-18 DIAGNOSIS — R3915 Urgency of urination: Secondary | ICD-10-CM | POA: Diagnosis not present

## 2019-11-30 DIAGNOSIS — H402231 Chronic angle-closure glaucoma, bilateral, mild stage: Secondary | ICD-10-CM | POA: Diagnosis not present

## 2019-11-30 DIAGNOSIS — H353131 Nonexudative age-related macular degeneration, bilateral, early dry stage: Secondary | ICD-10-CM | POA: Diagnosis not present

## 2019-11-30 DIAGNOSIS — H2513 Age-related nuclear cataract, bilateral: Secondary | ICD-10-CM | POA: Diagnosis not present

## 2019-11-30 DIAGNOSIS — H35373 Puckering of macula, bilateral: Secondary | ICD-10-CM | POA: Diagnosis not present

## 2019-11-30 DIAGNOSIS — H524 Presbyopia: Secondary | ICD-10-CM | POA: Diagnosis not present

## 2019-12-07 ENCOUNTER — Other Ambulatory Visit: Payer: Self-pay | Admitting: Family Medicine

## 2020-02-12 ENCOUNTER — Other Ambulatory Visit: Payer: Self-pay | Admitting: Family Medicine

## 2020-03-11 ENCOUNTER — Other Ambulatory Visit: Payer: Self-pay | Admitting: Family Medicine

## 2020-03-27 ENCOUNTER — Other Ambulatory Visit: Payer: Self-pay

## 2020-03-27 ENCOUNTER — Encounter: Payer: Self-pay | Admitting: Family Medicine

## 2020-03-27 ENCOUNTER — Ambulatory Visit (INDEPENDENT_AMBULATORY_CARE_PROVIDER_SITE_OTHER): Payer: Medicare Other

## 2020-03-27 ENCOUNTER — Ambulatory Visit (INDEPENDENT_AMBULATORY_CARE_PROVIDER_SITE_OTHER): Payer: Medicare Other | Admitting: Family Medicine

## 2020-03-27 VITALS — BP 146/76 | HR 91 | Temp 97.9°F | Ht 66.0 in | Wt 181.2 lb

## 2020-03-27 DIAGNOSIS — I10 Essential (primary) hypertension: Secondary | ICD-10-CM

## 2020-03-27 DIAGNOSIS — R7303 Prediabetes: Secondary | ICD-10-CM

## 2020-03-27 DIAGNOSIS — M1712 Unilateral primary osteoarthritis, left knee: Secondary | ICD-10-CM | POA: Diagnosis not present

## 2020-03-27 DIAGNOSIS — M25562 Pain in left knee: Secondary | ICD-10-CM | POA: Diagnosis not present

## 2020-03-27 DIAGNOSIS — M25462 Effusion, left knee: Secondary | ICD-10-CM | POA: Diagnosis not present

## 2020-03-27 LAB — COMPREHENSIVE METABOLIC PANEL
ALT: 25 U/L (ref 0–53)
AST: 20 U/L (ref 0–37)
Albumin: 4.4 g/dL (ref 3.5–5.2)
Alkaline Phosphatase: 64 U/L (ref 39–117)
BUN: 15 mg/dL (ref 6–23)
CO2: 28 mEq/L (ref 19–32)
Calcium: 9.7 mg/dL (ref 8.4–10.5)
Chloride: 102 mEq/L (ref 96–112)
Creatinine, Ser: 0.87 mg/dL (ref 0.40–1.50)
GFR: 82.7 mL/min (ref >60.00)
Glucose, Bld: 99 mg/dL (ref 70–99)
Potassium: 4.5 mEq/L (ref 3.5–5.1)
Sodium: 137 mEq/L (ref 135–145)
Total Bilirubin: 0.4 mg/dL (ref 0.2–1.2)
Total Protein: 7.1 g/dL (ref 6.0–8.3)

## 2020-03-27 LAB — CBC WITH DIFFERENTIAL/PLATELET
Basophils Absolute: 0.1 10*3/uL (ref 0.0–0.1)
Basophils Relative: 1.1 % (ref 0.0–3.0)
Eosinophils Absolute: 0.1 10*3/uL (ref 0.0–0.7)
Eosinophils Relative: 1.5 % (ref 0.0–5.0)
HCT: 43.4 % (ref 39.0–52.0)
Hemoglobin: 14.5 g/dL (ref 13.0–17.0)
Lymphocytes Relative: 13.7 % (ref 12.0–46.0)
Lymphs Abs: 1 10*3/uL (ref 0.7–4.0)
MCHC: 33.3 g/dL (ref 30.0–36.0)
MCV: 103.8 fl — ABNORMAL HIGH (ref 78.0–100.0)
Monocytes Absolute: 0.9 10*3/uL (ref 0.1–1.0)
Monocytes Relative: 13.4 % — ABNORMAL HIGH (ref 3.0–12.0)
Neutro Abs: 5 10*3/uL (ref 1.4–7.7)
Neutrophils Relative %: 70.3 % (ref 43.0–77.0)
Platelets: 273 10*3/uL (ref 150.0–400.0)
RBC: 4.18 Mil/uL — ABNORMAL LOW (ref 4.22–5.81)
RDW: 13.3 % (ref 11.5–15.5)
WBC: 7.1 10*3/uL (ref 4.0–10.5)

## 2020-03-27 LAB — HEMOGLOBIN A1C: Hgb A1c MFr Bld: 6.3 % (ref 4.6–6.5)

## 2020-03-27 NOTE — Progress Notes (Addendum)
Patient: Thomas Li MRN: [id number] DOB: December 21, 1940 PCP: Orma Flaming, MD     Subjective:  Chief Complaint  Patient presents with  . Hypertension  . Gastroesophageal Reflux  . Knee Pain  . Prediabetes    HPI: The patient is a 80 y.o. male who presents today for hypertension follow up as well as other concerns.  Hypertension: Here for follow up of hypertension.  Currently on cozaar 100mg  and norvasc 5mg  daily . Home readings range from 952-841 [id number]/72-53 diastolic. Takes medication as prescribed and denies any side effects. Exercise includes walking on treadmill. Weight has been stable. Denies any chest pain, headaches, shortness of breath, vision changes, swelling in lower extremities.   Prediabetes Due for follow up labs today. On no medication. Last a1c was 6.0.   Left knee pain He states it started  About 10 days ago. He has no trauma or injury that he can recall. 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 This is a normal thing for him. He did have his knee cleaned out in the 1980s can't remember if anything ewas torn. He does think it may have some mild swelling. He has pain with flexion to 90 degress that radiates down into the calf. He has no issues with extension. Pain with sideways movement. Pain is worse at night and can keep him awake. Pain is rated as a 6/10, gets better as day goes on. Pain is described as achy.   Review of Systems  Constitutional: Negative for chills, fatigue and fever.  HENT: Negative for dental problem, ear pain, hearing loss and trouble swallowing.   Eyes: Negative for visual disturbance.  Respiratory: Negative for cough, chest tightness and shortness of breath.   Cardiovascular: Negative for chest pain, palpitations and leg swelling.  Gastrointestinal: Negative for abdominal pain, blood in stool, diarrhea and nausea.  Endocrine: Negative for cold intolerance, polydipsia, polyphagia and polyuria.   Genitourinary: Negative for dysuria and hematuria.  Musculoskeletal: Positive for arthralgias (left knee pain ).  Skin: Negative for rash.  Neurological: Negative for dizziness and headaches.  Psychiatric/Behavioral: Negative for dysphoric mood and sleep disturbance. The patient is not nervous/anxious.     Allergies Patient is allergic to lisinopril.  Past Medical History Patient  has a past medical history of Abnormal EKG, Chronic combined systolic and diastolic heart failure ([place]) (66/44/0347), Complication of anesthesia, Hypercholesterolemia, Hypertension, Nocturnal polyuria, Plantar fasciitis, PONV (postoperative nausea and vomiting), and Weak urinary stream.  Surgical History Patient  has a past surgical history that includes SKIN CANCER AREAS REMOVED; ALC REPAIR; TEAR DUCT SURGERY (5 YRS AGO); and Thulium laser TURP (transurethral resection of prostate) (N/A, 03/11/2018).  Family History Pateint's family history includes Heart disease (age of onset: 47) in his father.  Social History Patient  reports that he quit smoking about 52 years ago. He has never used smokeless tobacco. He reports current alcohol use. He reports that he does not use drugs.    Objective: Vitals:   03/27/20 0804 03/27/20 0838  BP: (!) 150/70 (!) 146/76  Pulse: 91   Temp: 97.9 F (36.6 C)   TempSrc: Temporal   SpO2: 95%   Weight: 181 lb 3.2 oz (82.2 kg)   Height: 5\' 6"  (1.676 m)     Body mass index is 29.25 kg/m.  Physical Exam Vitals reviewed.  Constitutional:      Appearance: Normal appearance. He is well-developed and well-nourished. He is obese.  HENT:     Head:  Normocephalic and atraumatic.     Right Ear: Tympanic membrane, ear canal and external ear normal.     Left Ear: Tympanic membrane, ear canal and external ear normal.     Mouth/Throat:     Mouth: Oropharynx is clear and moist.  Eyes:     Extraocular Movements: EOM normal.     Conjunctiva/sclera: Conjunctivae normal.      Pupils: Pupils are equal, round, and reactive to light.  Neck:     Thyroid: No thyromegaly.     Vascular: No carotid bruit.  Cardiovascular:     Rate and Rhythm: Normal rate and regular rhythm.     Pulses: Normal pulses and intact distal pulses.     Heart sounds: Murmur heard.    Pulmonary:     Effort: Pulmonary effort is normal.     Breath sounds: Normal breath sounds.  Abdominal:     General: Bowel sounds are normal. There is no distension.     Palpations: Abdomen is soft.     Tenderness: There is no abdominal tenderness.  Musculoskeletal:        General: Swelling present.     Cervical back: Normal range of motion and neck supple.     Comments: Left knee with warmth and edema. No erythema. Negative draw signs. Negative pain with valgus and varus strain. Can fully extend to 180 degrees. Does have pain with flexion. ? Edema in popliteal fossa, but no pain to palpation. Some ttp over superior aspect of patells.   Lymphadenopathy:     Cervical: No cervical adenopathy.  Skin:    General: Skin is warm and dry.     Findings: No rash.  Neurological:     General: No focal deficit present.     Mental Status: He is alert and oriented to person, place, and time.     Cranial Nerves: No cranial nerve deficit.     Coordination: Coordination normal.     Deep Tendon Reflexes: Reflexes normal.  Psychiatric:        Mood and Affect: Mood and affect and mood normal.        Behavior: Behavior normal.    Knee xray: OA, >in medial aspect of knee. official read pending.   [place] Office Visit from 03/27/2020 in [place]  PHQ-2 Total Score 0         Assessment/plan:  1. Primary hypertension Blood pressure is near to goal. Continue current anti-hypertensive medications-norvasc 5mg /day and losartan 100mg /day. Refills not given and routine lab work will be done today. Recommended routine exercise and healthy diet including DASH diet and mediterranean diet.  Encouraged weight loss. F/u in 6 months.   - CBC with Differential/Platelet - Comprehensive metabolic panel  2. Pre-diabetes Has been well controlled with diet alone.  - Hemoglobin A1c  3. Acute pain of left knee Sounds like OA, clinical exam suggests this as well although ? If bakers cyst present. Checking xrays today. Recommended voltaren gel and discussed injections as possibility.  - DG Knee Complete 4 Views Left; Future  This visit occurred during the SARS-CoV-2 public health emergency.  Safety protocols were in place, including screening questions prior to the visit, additional usage of staff PPE, and extensive cleaning of exam room while observing appropriate contact time as indicated for disinfecting solutions.      Return in about 6 months (around 09/27/2020) for routine fasting labs.    Orma Flaming, MD [place]   03/27/2020

## 2020-03-27 NOTE — Patient Instructions (Addendum)
-  xray your knee today. Will f/u on that with plan. I would ice it 4x/day for 20 minutes and put voltaren gel on it 4x/day. Can take ibuprofen for pain before bed if needed. If your pain gets worse, let me know so we can give you something else for pain.   Routine labs today.   F/u in 6 months!

## 2020-04-04 DIAGNOSIS — M25562 Pain in left knee: Secondary | ICD-10-CM | POA: Diagnosis not present

## 2020-04-08 ENCOUNTER — Other Ambulatory Visit: Payer: Self-pay | Admitting: Family Medicine

## 2020-04-08 DIAGNOSIS — M1712 Unilateral primary osteoarthritis, left knee: Secondary | ICD-10-CM

## 2020-05-06 DIAGNOSIS — M1712 Unilateral primary osteoarthritis, left knee: Secondary | ICD-10-CM | POA: Diagnosis not present

## 2020-05-13 DIAGNOSIS — M1712 Unilateral primary osteoarthritis, left knee: Secondary | ICD-10-CM | POA: Diagnosis not present

## 2020-05-20 DIAGNOSIS — M1712 Unilateral primary osteoarthritis, left knee: Secondary | ICD-10-CM | POA: Diagnosis not present

## 2020-05-30 ENCOUNTER — Encounter: Payer: Self-pay | Admitting: Family Medicine

## 2020-05-30 ENCOUNTER — Ambulatory Visit (INDEPENDENT_AMBULATORY_CARE_PROVIDER_SITE_OTHER): Payer: Medicare Other | Admitting: Family Medicine

## 2020-05-30 ENCOUNTER — Ambulatory Visit: Payer: Medicare Other | Admitting: Family Medicine

## 2020-05-30 ENCOUNTER — Other Ambulatory Visit: Payer: Self-pay

## 2020-05-30 VITALS — BP 130/68 | HR 95 | Temp 98.2°F | Ht 66.0 in | Wt 175.2 lb

## 2020-05-30 DIAGNOSIS — M542 Cervicalgia: Secondary | ICD-10-CM | POA: Diagnosis not present

## 2020-05-30 DIAGNOSIS — G629 Polyneuropathy, unspecified: Secondary | ICD-10-CM | POA: Diagnosis not present

## 2020-05-30 DIAGNOSIS — G8929 Other chronic pain: Secondary | ICD-10-CM | POA: Diagnosis not present

## 2020-05-30 DIAGNOSIS — M1712 Unilateral primary osteoarthritis, left knee: Secondary | ICD-10-CM | POA: Insufficient documentation

## 2020-05-30 DIAGNOSIS — M1990 Unspecified osteoarthritis, unspecified site: Secondary | ICD-10-CM | POA: Insufficient documentation

## 2020-05-30 MED ORDER — GABAPENTIN 300 MG PO CAPS: 300.0000 mg | ORAL_CAPSULE | Freq: Three times a day (TID) | ORAL | 3 refills | Status: DC

## 2020-05-30 MED ORDER — MELOXICAM 15 MG PO TABS: 15.0000 mg | ORAL_TABLET | Freq: Every day | ORAL | 1 refills | Status: DC

## 2020-05-30 NOTE — Patient Instructions (Addendum)
-  for legs, sounds like neuropathy. We did labs on you in September and they were normal. (tsh/b12) -typically I will send to neurology for nerve studies which you may need in the future, but for now we can trial a nerve drug called gabapentin. You can take this at night and see if it helps or even during the day if needed. May make you drowsy so be careful when you first start this.   -im sending in mobic (NSAID) to take as needed for pain. If take regularly for long periods can cause ulcer in belly and will need to watch your kidney's.   -neck pain: mobic will help this and you can try voltaren gel as well (over the counter). You can also try a heating pad to see if helps with the medication. If not getting better will need to xray and you can also ask dr. Cay Schillings since im leaving! Any numbness, tingling or weakness in arms need MRI    Keep me posted!

## 2020-05-30 NOTE — Progress Notes (Signed)
Patient: Thomas Li MRN: [id number] DOB: [birth year] PCP: Orma Flaming, MD     Subjective:  Chief Complaint  Patient presents with  . Leg Pain    Pt says that he was seen by a Chiropractor and has gotten relief.  . Neck Pain    Pt says that he was seen by a Chiropractor and has gotten relief.    HPI: The patient is a 80 y.o. male who presents today for neck and leg pain, but has recently gotten relief with seeing a chiropractor.   He has his list. He did see ortho for his left knee OA and is getting steroid injections that are helping.   He also is still having what sounds like neuropathy in bilateral lower legs/feet. We worked this up in September and labs were normal and he opted for no neurology referral or meds. He is now interested in medication.   He also had some back pain, but the chiropractor resolved this, but in meantime his neck started hurting.  He states this started about 1.5 weeks ago. He had xrays done at the chiropractor, but doesn't have them here, thinks he was told arthritis. He has more stiffness in the AM with movement of his neck and by the afternoon he has full range of motion. He has no numbness, tingling or weakness in his arms/hands. He has used biofreeze, unsure if this helps. He has not taken any NSAIDs.   Review of Systems  Constitutional: Negative for fatigue and fever.  HENT: Negative for congestion.   Respiratory: Negative for cough and shortness of breath.   Cardiovascular: Negative for chest pain, palpitations and leg swelling.  Gastrointestinal: Negative for abdominal pain, diarrhea, nausea and vomiting.  Musculoskeletal: Positive for arthralgias and neck pain.  Neurological: Negative for weakness and numbness.       Tingling in feet/lower legs     Allergies Patient is allergic to lisinopril.  Past Medical History Patient  has a past medical history of Abnormal EKG, Chronic combined systolic and diastolic heart failure ([place])  (40/98/1191), Complication of anesthesia, Hypercholesterolemia, Hypertension, Nocturnal polyuria, Plantar fasciitis, PONV (postoperative nausea and vomiting), and Weak urinary stream.  Surgical History Patient  has a past surgical history that includes SKIN CANCER AREAS REMOVED; ALC REPAIR; TEAR DUCT SURGERY (5 YRS AGO); and Thulium laser TURP (transurethral resection of prostate) (N/A, 03/11/2018).  Family History Pateint's family history includes Heart disease (age of onset: 71) in his father.  Social History Patient  reports that he quit smoking about 52 years ago. He has never used smokeless tobacco. He reports current alcohol use. He reports that he does not use drugs.    Objective: Vitals:   05/30/20 1314  BP: 130/68  Pulse: 95  Temp: 98.2 F (36.8 C)  TempSrc: Temporal  SpO2: 93%  Weight: 175 lb 3.2 oz (79.5 kg)  Height: 5\' 6"  (1.676 m)    Body mass index is 28.28 kg/m.  Physical Exam Vitals reviewed.  Constitutional:      Appearance: Normal appearance. He is obese.  HENT:     Head: Normocephalic and atraumatic.  Neck:     Comments: Full ROM on exam today. TTP over spinal processes of cervical spine. No tenderness over trapezius.  Cardiovascular:     Rate and Rhythm: Normal rate and regular rhythm.     Heart sounds: Normal heart sounds.  Pulmonary:     Effort: Pulmonary effort is normal.     Breath sounds: Normal breath sounds.  Musculoskeletal:     Cervical back: Normal range of motion and neck supple. No rigidity.     Comments: Normal strength, sensation and DTR of bilateral upper extremities. Grip strength intact   Skin:    General: Skin is warm.     Capillary Refill: Capillary refill takes less than 2 seconds.  Neurological:     General: No focal deficit present.     Mental Status: He is alert and oriented to person, place, and time.        Assessment/plan: 1. 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 No radicular symptoms or red flags from  history or exam.  -just had films done at chiropractor, but discussed would need to repeat here, does not want to do this today.  -will send in mobic to take as needed. Discussed long term increases risk for gastric ulcer/kidney issues. Also would trial voltaren gel and heating pad.  -if not getting better would return for xrays and may benefit from Pt as well.  -if radicular symptoms in future would need MRI -he may also discuss with his orthopedic doc when he sees him next as well.   2. Neuropathy -tsh/b12 have been normal.  -declines neuro referral for emg at this time. Opting for gabapentin trial. Discussed can start taking at night and drowsy precautions given. If no improvement or worsening symptoms advised he will need to see neuro for EMG studies.     Return in about 4 months (around 09/30/2020) for routine htn/chronic issue f/u. Marland Kitchen   Orma Flaming, MD [place]   05/30/2020

## 2020-07-01 DIAGNOSIS — M25562 Pain in left knee: Secondary | ICD-10-CM | POA: Diagnosis not present

## 2020-07-10 ENCOUNTER — Telehealth (INDEPENDENT_AMBULATORY_CARE_PROVIDER_SITE_OTHER): Payer: Medicare Other | Admitting: Family Medicine

## 2020-07-10 DIAGNOSIS — R0989 Other specified symptoms and signs involving the circulatory and respiratory systems: Secondary | ICD-10-CM | POA: Diagnosis not present

## 2020-07-10 DIAGNOSIS — R059 Cough, unspecified: Secondary | ICD-10-CM | POA: Diagnosis not present

## 2020-07-10 MED ORDER — BENZONATATE 100 MG PO CAPS: 100.0000 mg | ORAL_CAPSULE | Freq: Three times a day (TID) | ORAL | 0 refills | Status: DC | PRN

## 2020-07-10 NOTE — Progress Notes (Signed)
Virtual Visit via audio Note -   I connected with [name] on 07/10/20 at 1:17 PM by audio  - unable to connect by video. Verified that I am speaking with the correct person using two identifiers.  Patient location:home My location: office - [place].   I discussed the limitations, risks, security and privacy concerns of performing an evaluation and management service by telephone and the availability of in person appointments. I also discussed with the patient that there may be a patient responsible charge related to this service. The patient expressed understanding and agreed to proceed, consent obtained  Chief complaint:  Chief Complaint  Patient presents with   Cough    Pt has had cough, congestion, denies fever, pt reports this started about 1 week ago, has been improving but not 100%, pt reports no COVID vaccine but has had infection in January 2021    History of Present Illness: Thomas Li is a 80 y.o. male  Cough, congestion started first part of last week - 6/7. No fever. No shortness of breath.  No known sick contacts. Rare wheeze.  No chest pain or leg swelling.  Some cough - spasmodic. Clear mucus.Cough has improved - only now and then. Eating and drinking ok, no confusion, no disorientation. Has been able to work a few days.   Tx: none.   Covid complication risk score of 4.  Had covid infection in January of 2021 - no  covid vaccine or booster.   Patient Active Problem List   Diagnosis Date Noted   Osteoarthritis of left knee 05/30/2020   GERD (gastroesophageal reflux disease) 09/27/2019   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   Pre-diabetes 06/17/2017   Chronic combined systolic and diastolic heart failure ([place]) 07/18/2015   Elevated PSA 09/05/2012   LBBB (left bundle branch block) 07/24/2011   BPH (benign prostatic hyperplasia) 05/09/2010   Hypertension    Hypercholesterolemia    Nocturnal polyuria    Past Medical History:   Diagnosis Date   Abnormal EKG    HX OF LEFT BUNDLE BRANCH BLOCK PER DR Huxley NOTE 05-06-16 EPIC   Chronic combined systolic and diastolic heart failure ([place]) 07/18/2015   NO CURRENT CARDIOLOGIST ISSUE IMPROVED   Complication of anesthesia    Hypercholesterolemia    Hypertension    Nocturnal polyuria    Plantar fasciitis    RIGHT FOOT   PONV (postoperative nausea and vomiting)    NEEDS NAUSEA PRE MED   Weak urinary stream    Past Surgical History:  Procedure Laterality Date   ALC REPAIR     SKIN CANCER AREAS REMOVED     TOP OF HEAD   TEAR DUCT SURGERY  5 YRS AGO   THULIUM LASER TURP (TRANSURETHRAL RESECTION OF PROSTATE) N/A 03/11/2018   Procedure: THULIUM LASER TURP (TRANSURETHRAL RESECTION OF PROSTATE);  Surgeon: Festus Aloe, MD;  Location: [facility];  Service: Urology;  Laterality: N/A;   Allergies  Allergen Reactions   Lisinopril Cough   Prior to Admission medications   Medication Sig Start Date End Date Taking? Authorizing Provider  amLODipine (NORVASC) 5 MG tablet TAKE 1 TABLET BY MOUTH EVERY DAY 03/11/20  Yes Orma Flaming, MD  carboxymethylcellulose (REFRESH PLUS) 0.5 % SOLN 1 drop 3 (three) times daily as needed.   Yes [provider]  Coenzyme Q10 (CO Q-10) 200 MG CAPS Take 200 mg by mouth daily.   Yes [provider]  famotidine (PEPCID) 20 MG tablet TAKE  1 TABLET BY MOUTH EVERY DAY 03/11/20  Yes Orma Flaming, MD  gabapentin (NEURONTIN) 300 MG capsule Take 1 capsule (300 mg total) by mouth 3 (three) times daily. 05/30/20  Yes Orma Flaming, MD  losartan (COZAAR) 100 MG tablet TAKE 1 TABLET BY MOUTH EVERY DAY 02/13/20  Yes Vivi Barrack, MD  meloxicam (MOBIC) 15 MG tablet Take 1 tablet (15 mg total) by mouth daily. 05/30/20  Yes Orma Flaming, MD  Multiple Vitamins-Minerals (PRESERVISION AREDS 2) CAPS Take by mouth daily.    Yes [provider]  rosuvastatin (CRESTOR) 10 MG tablet TAKE 1 TABLET BY MOUTH EVERY DAY  12/07/19  Yes Orma Flaming, MD  tolterodine (DETROL) 2 MG tablet Take 2 mg by mouth every morning.   Yes [provider]  UNABLE TO FIND LANTAPOST 0. 05% AT HS   Yes [provider]   Social History   Socioeconomic History   Marital status: Married    Spouse name: Not on file   Number of children: Not on file   Years of education: Not on file   Highest education level: Not on file  Occupational History   Not on file  Tobacco Use   Smoking status: Former    Pack years: 0.00    Types: Cigarettes    Quit date: 01/27/1968    Years since quitting: 52.4   Smokeless tobacco: Never  Vaping Use   Vaping Use: Never used  Substance and Sexual Activity   Alcohol use: Yes    Comment: WINE Q NIGHT   Drug use: No   Sexual activity: Yes    Partners: Female  Other Topics Concern   Not on file  Social History Narrative   Not on file   Social Determinants of Health   Financial Resource Strain: Not on file  Food Insecurity: Not on file  Transportation Needs: Not on file  Physical Activity: Not on file  Stress: Not on file  Social Connections: Not on file  Intimate Partner Violence: Not on file    Observations/Objective: There were no vitals filed for this visit. Speaking in full sentences, no respiratory distress on phone.  Coherent responses, appropriate responses.  All questions were answered with understanding of plan expressed   Assessment and Plan: Cough - Plan: benzonatate (TESSALON) 100 MG capsule  Chest congestion - Plan: benzonatate (TESSALON) 100 MG capsule Suspicious for viral infection, including possible COVID-19.  Did recommend testing at home, masking around others at this time.  However based on timing of symptoms, may be outside of window for antiviral and monoclonal antibody treatments.  He does report cough has improved with only intermittent cough.  Intermittent wheeze without dyspnea, or fever.  No chest pain or dyspnea, unlikely flare of CHF.   Again symptoms improving.  Staying hydrated.  No confusion.  Continued symptomatic care discussed, testing as above, Tessalon Perles for cough, update in the next 2 days if not continuing to improve for possible imaging.  Deferred antibiotics at this time as likely viral.  RTC/ER precautions given   Follow Up Instructions:  If not improving/continuing to improve in the next few days, recommended follow-up determine if imaging or in person visit needed.  I discussed the assessment and treatment plan with the patient. The patient was provided an opportunity to ask questions and all were answered. The patient agreed with the plan and demonstrated an understanding of the instructions.   The patient was advised to call back or seek an in-person evaluation if  the symptoms worsen or if the condition fails to improve as anticipated.  I provided 21 minutes of non-face-to-face time during this encounter.   Wendie Agreste, MD

## 2020-08-05 ENCOUNTER — Emergency Department (HOSPITAL_BASED_OUTPATIENT_CLINIC_OR_DEPARTMENT_OTHER)
Admission: EM | Admit: 2020-08-05 | Discharge: 2020-08-05 | Disposition: A | Discharge status: Home / Self Care | Payer: Medicare Other | Attending: Emergency Medicine | Admitting: Emergency Medicine

## 2020-08-05 ENCOUNTER — Telehealth: Payer: Self-pay

## 2020-08-05 ENCOUNTER — Emergency Department (HOSPITAL_BASED_OUTPATIENT_CLINIC_OR_DEPARTMENT_OTHER): Payer: Medicare Other

## 2020-08-05 ENCOUNTER — Encounter (HOSPITAL_BASED_OUTPATIENT_CLINIC_OR_DEPARTMENT_OTHER): Payer: Self-pay | Admitting: Obstetrics and Gynecology

## 2020-08-05 ENCOUNTER — Other Ambulatory Visit: Payer: Self-pay

## 2020-08-05 DIAGNOSIS — I110 Hypertensive heart disease with heart failure: Secondary | ICD-10-CM | POA: Insufficient documentation

## 2020-08-05 DIAGNOSIS — I5042 Chronic combined systolic (congestive) and diastolic (congestive) heart failure: Secondary | ICD-10-CM | POA: Diagnosis not present

## 2020-08-05 DIAGNOSIS — Z87891 Personal history of nicotine dependence: Secondary | ICD-10-CM | POA: Insufficient documentation

## 2020-08-05 DIAGNOSIS — R109 Unspecified abdominal pain: Secondary | ICD-10-CM | POA: Diagnosis not present

## 2020-08-05 DIAGNOSIS — R1031 Right lower quadrant pain: Secondary | ICD-10-CM | POA: Insufficient documentation

## 2020-08-05 DIAGNOSIS — Z79899 Other long term (current) drug therapy: Secondary | ICD-10-CM | POA: Diagnosis not present

## 2020-08-05 LAB — COMPREHENSIVE METABOLIC PANEL
ALT: 27 U/L (ref 0–44)
AST: 17 U/L (ref 15–41)
Albumin: 4.3 g/dL (ref 3.5–5.0)
Alkaline Phosphatase: 64 U/L (ref 38–126)
Anion gap: 8 (ref 5–15)
BUN: 16 mg/dL (ref 8–23)
CO2: 27 mmol/L (ref 22–32)
Calcium: 8.8 mg/dL — ABNORMAL LOW (ref 8.9–10.3)
Chloride: 99 mmol/L (ref 98–111)
Creatinine, Ser: 1.1 mg/dL (ref 0.61–1.24)
GFR, Estimated: >60 mL/min (ref >60)
Glucose, Bld: 108 mg/dL — ABNORMAL HIGH (ref 70–99)
Potassium: 4.6 mmol/L (ref 3.5–5.1)
Sodium: 134 mmol/L — ABNORMAL LOW (ref 135–145)
Total Bilirubin: 0.3 mg/dL (ref 0.3–1.2)
Total Protein: 6.7 g/dL (ref 6.5–8.1)

## 2020-08-05 LAB — URINALYSIS, ROUTINE W REFLEX MICROSCOPIC
Bilirubin Urine: NEGATIVE
Glucose, UA: NEGATIVE mg/dL
Hgb urine dipstick: NEGATIVE
Ketones, ur: NEGATIVE mg/dL
Leukocytes,Ua: NEGATIVE
Nitrite: NEGATIVE
Protein, ur: 30 mg/dL — AB
Specific Gravity, Urine: 1.017 (ref 1.005–1.030)
pH: 6 (ref 5.0–8.0)

## 2020-08-05 LAB — CBC
HCT: 39.2 % (ref 39.0–52.0)
Hemoglobin: 12.8 g/dL — ABNORMAL LOW (ref 13.0–17.0)
MCH: 33.7 pg (ref 26.0–34.0)
MCHC: 32.7 g/dL (ref 30.0–36.0)
MCV: 103.2 fL — ABNORMAL HIGH (ref 80.0–100.0)
Platelets: 380 10*3/uL (ref 150–400)
RBC: 3.8 MIL/uL — ABNORMAL LOW (ref 4.22–5.81)
RDW: 15.3 % (ref 11.5–15.5)
WBC: 9.1 10*3/uL (ref 4.0–10.5)
nRBC: 0 % (ref 0.0–0.2)

## 2020-08-05 LAB — LIPASE, BLOOD: Lipase: 27 U/L (ref 11–51)

## 2020-08-05 MED ORDER — SODIUM CHLORIDE 0.9 % IV BOLUS
500.0000 mL | Freq: Once | INTRAVENOUS | Status: AC
  Administered 2020-08-05: 500 mL via INTRAVENOUS

## 2020-08-05 MED ORDER — IOHEXOL 300 MG/ML  SOLN
100.0000 mL | Freq: Once | INTRAMUSCULAR | Status: AC | PRN
  Administered 2020-08-05: 100 mL via INTRAVENOUS

## 2020-08-05 NOTE — ED Provider Notes (Signed)
[facility] EMERGENCY DEPT Provider Note   CSN: [id number] Arrival date & time: 08/05/20  1116     History Chief Complaint  Patient presents with   Abdominal Pain    Thomas Li is a 80 y.o. male.  HPI 80 year old male presents with lower abdominal pain.  Originally started a couple days ago.  He had some dry heaving on the first day and then some dry heaving today.  Otherwise he has had no nausea or diarrhea.  No constipation and is having normal bowel movements.  The pain is diffuse in his lower abdomen and was about an 8 out of 10 yesterday but now is about a 3 out of 10 and improving today.  No back pain, fever, chest pain.  Has not take anything for the pain.  Feels like a dull ache.  Hurts about 20-30 minutes after eating.  Past Medical History:  Diagnosis Date   Abnormal EKG    HX OF LEFT BUNDLE BRANCH BLOCK PER DR Luther NOTE 05-06-16 EPIC   Chronic combined systolic and diastolic heart failure ([place]) 07/18/2015   NO CURRENT CARDIOLOGIST ISSUE IMPROVED   Complication of anesthesia    Hypercholesterolemia    Hypertension    Nocturnal polyuria    Plantar fasciitis    RIGHT FOOT   PONV (postoperative nausea and vomiting)    NEEDS NAUSEA PRE MED   Weak urinary stream     Patient Active Problem List   Diagnosis Date Noted   Osteoarthritis of left knee 05/30/2020   GERD (gastroesophageal reflux disease) 09/27/2019   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   Pre-diabetes 06/17/2017   Chronic combined systolic and diastolic heart failure ([place]) 07/18/2015   Elevated PSA 09/05/2012   LBBB (left bundle branch block) 07/24/2011   BPH (benign prostatic hyperplasia) 05/09/2010   Hypertension    Hypercholesterolemia    Nocturnal polyuria     Past Surgical History:  Procedure Laterality Date   ALC REPAIR     SKIN CANCER AREAS REMOVED     TOP OF HEAD   TEAR DUCT SURGERY  5 YRS AGO   THULIUM LASER TURP (TRANSURETHRAL RESECTION OF  PROSTATE) N/A 03/11/2018   Procedure: THULIUM LASER TURP (TRANSURETHRAL RESECTION OF PROSTATE);  Surgeon: Festus Aloe, MD;  Location: [facility];  Service: Urology;  Laterality: N/A;       Family History  Problem Relation Age of Onset   Heart disease Father 29    Social History   Tobacco Use   Smoking status: Former    Pack years: 0.00    Types: Cigarettes    Quit date: 01/27/1968    Years since quitting: 52.5   Smokeless tobacco: Never  Vaping Use   Vaping Use: Never used  Substance Use Topics   Alcohol use: Yes    Comment: WINE Q NIGHT   Drug use: No    Home Medications Prior to Admission medications   Medication Sig Start Date End Date Taking? Authorizing Provider  amLODipine (NORVASC) 5 MG tablet TAKE 1 TABLET BY MOUTH EVERY DAY 03/11/20   Orma Flaming, MD  benzonatate (TESSALON) 100 MG capsule Take 1 capsule (100 mg total) by mouth 3 (three) times daily as needed for cough. 07/10/20   Wendie Agreste, MD  carboxymethylcellulose (REFRESH PLUS) 0.5 % SOLN 1 drop 3 (three) times daily as needed.    [provider]  Coenzyme Q10 (CO Q-10) 200 MG CAPS Take 200 mg by mouth daily.  [provider]  famotidine (PEPCID) 20 MG tablet TAKE 1 TABLET BY MOUTH EVERY DAY 03/11/20   Orma Flaming, MD  gabapentin (NEURONTIN) 300 MG capsule Take 1 capsule (300 mg total) by mouth 3 (three) times daily. 05/30/20   Orma Flaming, MD  losartan (COZAAR) 100 MG tablet TAKE 1 TABLET BY MOUTH EVERY DAY 02/13/20   Vivi Barrack, MD  meloxicam (MOBIC) 15 MG tablet Take 1 tablet (15 mg total) by mouth daily. 05/30/20   Orma Flaming, MD  Multiple Vitamins-Minerals (PRESERVISION AREDS 2) CAPS Take by mouth daily.     [provider]  rosuvastatin (CRESTOR) 10 MG tablet TAKE 1 TABLET BY MOUTH EVERY DAY 12/07/19   Orma Flaming, MD  tolterodine (DETROL) 2 MG tablet Take 2 mg by mouth every morning.    [provider]  UNABLE TO FIND LANTAPOST  0. 05% AT HS    [provider]    Allergies    Lisinopril  Review of Systems   Review of Systems  Constitutional:  Negative for fever.  Cardiovascular:  Negative for chest pain.  Gastrointestinal:  Positive for abdominal pain and vomiting. Negative for constipation and diarrhea.  Musculoskeletal:  Negative for back pain.  All other systems reviewed and are negative.  Physical Exam Updated Vital Signs BP (!) 112/59 (BP Location: Right Arm)   Pulse 80   Temp 98.2 F (36.8 C)   Resp 16   Ht 5\' 6"  (1.676 m)   Wt 78 kg   SpO2 91%   BMI 27.76 kg/m   Physical Exam Vitals and nursing note reviewed.  Constitutional:      General: He is not in acute distress.    Appearance: He is well-developed. He is not ill-appearing or diaphoretic.  HENT:     Head: Normocephalic and atraumatic.     Right Ear: External ear normal.     Left Ear: External ear normal.     Nose: Nose normal.  Eyes:     General:        Right eye: No discharge.        Left eye: No discharge.  Cardiovascular:     Rate and Rhythm: Normal rate and regular rhythm.     Heart sounds: Murmur heard.  Pulmonary:     Effort: Pulmonary effort is normal.     Breath sounds: Normal breath sounds.  Abdominal:     Palpations: Abdomen is soft.     Tenderness: There is abdominal tenderness in the right upper quadrant and right lower quadrant.  Musculoskeletal:     Cervical back: Neck supple.  Skin:    General: Skin is warm and dry.  Neurological:     Mental Status: He is alert.  Psychiatric:        Mood and Affect: Mood is not anxious.    ED Results / Procedures / Treatments   Labs (all labs ordered are listed, but only abnormal results are displayed) Labs Reviewed  COMPREHENSIVE METABOLIC PANEL - Abnormal; Notable for the following components:      Result Value   Sodium 134 (*)    Glucose, Bld 108 (*)    Calcium 8.8 (*)    All other components within normal limits  CBC - Abnormal; Notable for the  following components:   RBC 3.80 (*)    Hemoglobin 12.8 (*)    MCV 103.2 (*)    All other components within normal limits  URINALYSIS, ROUTINE W REFLEX MICROSCOPIC - Abnormal; Notable  for the following components:   Protein, ur 30 (*)    All other components within normal limits  LIPASE, BLOOD    EKG None  Radiology CT ABDOMEN PELVIS W CONTRAST  Result Date: 08/05/2020 CLINICAL DATA:  Right lower quadrant pain EXAM: CT ABDOMEN AND PELVIS WITH CONTRAST TECHNIQUE: Multidetector CT imaging of the abdomen and pelvis was performed using the standard protocol following bolus administration of intravenous contrast. CONTRAST:  161mL OMNIPAQUE IOHEXOL 300 MG/ML  SOLN COMPARISON:  None. FINDINGS: Lower chest: Lung bases are clear. No effusions. Heart is normal size. Hepatobiliary: No focal hepatic abnormality. Gallbladder unremarkable. Pancreas: No focal abnormality or ductal dilatation. Spleen: No focal abnormality.  Normal size. Adrenals/Urinary Tract: 5.8 cm cyst in the upper pole of the right kidney. Parapelvic cysts on the left. No hydronephrosis. Adrenal glands and urinary bladder unremarkable. Stomach/Bowel: Normal appendix. Stomach, large and small bowel grossly unremarkable. Vascular/Lymphatic: Aortic atherosclerosis. No evidence of aneurysm or adenopathy. Reproductive: Mildly prominent prostate. Other: No free fluid or free air. Musculoskeletal: No acute bony abnormality. IMPRESSION: Bilateral renal cysts.  No hydronephrosis. Aortic atherosclerosis. No acute findings in the abdomen or pelvis. Electronically Signed   By: Rolm Baptise M.D.   On: 08/05/2020 12:36   US Abdomen Limited RUQ (LIVER/GB)  Result Date: 08/05/2020 CLINICAL DATA:  Right side abdominal pain for 3 days. EXAM: ULTRASOUND ABDOMEN LIMITED RIGHT UPPER QUADRANT COMPARISON:  CT abdomen and pelvis today. FINDINGS: Gallbladder: No gallstones or wall thickening visualized. No sonographic Murphy sign noted by sonographer. Common bile  duct: Diameter: 0.4 cm Liver: No focal lesion identified. Within normal limits in parenchymal echogenicity. Portal vein is patent on color Doppler imaging with normal direction of blood flow towards the liver. Other: Right renal cyst as seen on CT incidentally noted. IMPRESSION: Negative for gallstones.  Negative exam. Electronically Signed   By: Inge Rise M.D.   On: 08/05/2020 13:52    Procedures Procedures   Medications Ordered in ED Medications  sodium chloride 0.9 % bolus 500 mL (0 mLs Intravenous Stopped 08/05/20 1514)  iohexol (OMNIPAQUE) 300 MG/ML solution 100 mL (100 mLs Intravenous Contrast Given 08/05/20 1224)    ED Course  I have reviewed the triage vital signs and the nursing notes.  Pertinent labs & imaging results that were available during my care of the patient were reviewed by me and considered in my medical decision making (see chart for details).    MDM Rules/Calculators/A&P                          No clear cause for the patient's abdominal pain.  No obvious hernia on exam.  CT and right upper quadrant ultrasound are both negative.  Labs are unremarkable.  His pains are improving since it started a couple days ago but still present.  However at this point I think he is stable for discharge home to follow-up with PCP if no improvement.  Given return precautions. Final Clinical Impression(s) / ED Diagnoses Final diagnoses:  Right sided abdominal pain    Rx / DC Orders ED Discharge Orders     None        Sherwood Gambler, MD 08/05/20 [phone]

## 2020-08-05 NOTE — Telephone Encounter (Signed)
Nurse Assessment Nurse: Doyle Askew, RN, Beth Date/Time Eilene Ghazi Time): 08/05/2020 10:26:26 AM Confirm and document reason for call. If symptomatic, describe symptoms. ---Caller states her husband is having sharp abdominal pain started approx 4 days ago, felt better yesterday but it is getting worse today...caller states pain is in middle of abdominal region and to the left a little. Does the patient have any new or worsening symptoms? ---Yes Will a triage be completed? ---Yes Related visit to physician within the last 2 weeks? ---No Does the PT have any chronic conditions? (i.e. diabetes, asthma, this includes High risk factors for pregnancy, etc.) ---No Is this a behavioral health or substance abuse call? ---No Guidelines Guideline Title Affirmed Question Affirmed Notes Nurse Date/Time Eilene Ghazi Time) Abdominal Pain - Male [1] SEVERE pain AND [2] age > 67 years Alcario Drought 08/05/2020 10:29:36 AM PLEASE NOTE: All timestamps contained within this report are represented as [place] Standard Time. CONFIDENTIALTY NOTICE: This fax transmission is intended only for the addressee. It contains information that is legally privileged, confidential or otherwise protected from use or disclosure. If you are not the intended recipient, you are strictly prohibited from reviewing, disclosing, copying using or disseminating any of this information or taking any action in reliance on or regarding this information. If you have received this fax in error, please notify us immediately by telephone so that we can arrange for its return to [place]. Phone: [phone], Toll-Free: [phone], Fax: [phone] Page: 2 of 2 Call Id: [id number] [place]. Time Eilene Ghazi Time) Disposition Final User 08/05/2020 10:20:21 AM Send to Urgent Queue Shann Medal 08/05/2020 10:36:20 AM Go to ED Now Yes Doyle Askew, RN, Beth Caller Disagree/Comply Comply Caller Understands Yes PreDisposition Call Doctor Care Advice Given Per  Guideline GO TO ED NOW: * You need to be seen in the Emergency Department. * Go to the ED at ___________ [place] now. Drive carefully. ANOTHER ADULT SHOULD DRIVE: * It is better and safer if another adult drives instead of you. NOTHING BY MOUTH: * Do not eat or drink anything for now. CARE ADVICE given per Abdominal Pain, Male (Adult) guideline. Referrals GO TO FACILITY OTHER - SPECIFY

## 2020-08-05 NOTE — Discharge Instructions (Addendum)
If you develop worsening, continued, or recurrent abdominal pain, uncontrolled vomiting, fever, chest or back pain, or any other new/concerning symptoms then return to the ER for evaluation.  

## 2020-08-05 NOTE — ED Triage Notes (Signed)
Patient reports to the ER for lower abdominal pains x3 days. Patient reports pain with eating. Patient reports pain with palpation. Patient reports emesis of bile/clear fluid today and 2 days ago. Patient reports last BM this morning and it was normal.  Patient denies blood in stool.  Patient denies trouble urinating and urinates reportedly frequently.

## 2020-08-05 NOTE — ED Notes (Signed)
Patient verbalizes understanding of discharge instructions. Opportunity for questioning and answers were provided. Patient discharged from ED.  °

## 2020-08-05 NOTE — Telephone Encounter (Signed)
Pt went to ED at [facility].

## 2020-08-13 ENCOUNTER — Telehealth: Payer: Self-pay

## 2020-08-13 NOTE — Telephone Encounter (Signed)
Talked to patient's wife that Dr Rogers Blocker has left and she will call back after she has her transfer of care

## 2020-08-16 DIAGNOSIS — R109 Unspecified abdominal pain: Secondary | ICD-10-CM | POA: Diagnosis not present

## 2020-08-16 DIAGNOSIS — N401 Enlarged prostate with lower urinary tract symptoms: Secondary | ICD-10-CM | POA: Diagnosis not present

## 2020-08-16 DIAGNOSIS — N281 Cyst of kidney, acquired: Secondary | ICD-10-CM | POA: Diagnosis not present

## 2020-08-16 DIAGNOSIS — R350 Frequency of micturition: Secondary | ICD-10-CM | POA: Diagnosis not present

## 2020-08-24 ENCOUNTER — Other Ambulatory Visit: Payer: Self-pay | Admitting: Family Medicine

## 2020-08-26 DIAGNOSIS — H402231 Chronic angle-closure glaucoma, bilateral, mild stage: Secondary | ICD-10-CM | POA: Diagnosis not present

## 2020-08-27 ENCOUNTER — Other Ambulatory Visit: Payer: Self-pay

## 2020-08-27 MED ORDER — FAMOTIDINE 20 MG PO TABS: 20.0000 mg | ORAL_TABLET | Freq: Every day | ORAL | 0 refills | Status: DC

## 2020-08-27 MED ORDER — AMLODIPINE BESYLATE 5 MG PO TABS: 5.0000 mg | ORAL_TABLET | Freq: Every day | ORAL | 0 refills | Status: DC

## 2020-09-27 ENCOUNTER — Encounter: Payer: Medicare Other | Admitting: Family Medicine

## 2020-10-17 DIAGNOSIS — N401 Enlarged prostate with lower urinary tract symptoms: Secondary | ICD-10-CM | POA: Diagnosis not present

## 2020-10-24 DIAGNOSIS — R350 Frequency of micturition: Secondary | ICD-10-CM | POA: Diagnosis not present

## 2020-10-24 DIAGNOSIS — N401 Enlarged prostate with lower urinary tract symptoms: Secondary | ICD-10-CM | POA: Diagnosis not present

## 2020-10-24 DIAGNOSIS — R972 Elevated prostate specific antigen [PSA]: Secondary | ICD-10-CM | POA: Diagnosis not present

## 2020-10-30 ENCOUNTER — Other Ambulatory Visit: Payer: Self-pay | Admitting: Family Medicine

## 2020-11-25 DIAGNOSIS — Z23 Encounter for immunization: Secondary | ICD-10-CM | POA: Diagnosis not present

## 2020-11-27 ENCOUNTER — Other Ambulatory Visit: Payer: Self-pay | Admitting: Family Medicine

## 2020-11-28 ENCOUNTER — Other Ambulatory Visit: Payer: Self-pay | Admitting: Family Medicine

## 2020-11-28 ENCOUNTER — Other Ambulatory Visit: Payer: Self-pay

## 2020-12-02 ENCOUNTER — Other Ambulatory Visit: Payer: Self-pay | Admitting: Family Medicine

## 2020-12-11 ENCOUNTER — Encounter: Payer: Self-pay | Admitting: Family Medicine

## 2020-12-12 ENCOUNTER — Ambulatory Visit (INDEPENDENT_AMBULATORY_CARE_PROVIDER_SITE_OTHER): Payer: Medicare Other | Admitting: Family Medicine

## 2020-12-12 ENCOUNTER — Other Ambulatory Visit: Payer: Self-pay

## 2020-12-12 ENCOUNTER — Encounter: Payer: Self-pay | Admitting: Family Medicine

## 2020-12-12 VITALS — BP 151/70 | HR 92 | Temp 98.3°F | Ht 66.0 in | Wt 176.2 lb

## 2020-12-12 DIAGNOSIS — Z23 Encounter for immunization: Secondary | ICD-10-CM | POA: Diagnosis not present

## 2020-12-12 DIAGNOSIS — G629 Polyneuropathy, unspecified: Secondary | ICD-10-CM | POA: Insufficient documentation

## 2020-12-12 DIAGNOSIS — E7800 Pure hypercholesterolemia, unspecified: Secondary | ICD-10-CM | POA: Diagnosis not present

## 2020-12-12 DIAGNOSIS — I10 Essential (primary) hypertension: Secondary | ICD-10-CM | POA: Diagnosis not present

## 2020-12-12 DIAGNOSIS — R7303 Prediabetes: Secondary | ICD-10-CM | POA: Diagnosis not present

## 2020-12-12 DIAGNOSIS — R972 Elevated prostate specific antigen [PSA]: Secondary | ICD-10-CM

## 2020-12-12 DIAGNOSIS — I5042 Chronic combined systolic (congestive) and diastolic (congestive) heart failure: Secondary | ICD-10-CM | POA: Diagnosis not present

## 2020-12-12 NOTE — Assessment & Plan Note (Signed)
No signs of volume overload. 

## 2020-12-12 NOTE — Assessment & Plan Note (Signed)
Follows with urology. PSA has been stable.

## 2020-12-12 NOTE — Patient Instructions (Signed)
It was very nice to see you today!  We will give your pneumonia vaccine today.  Please get the shingles vaccine and tetanus vaccine at the pharmacy.  No other changes today.  We will see you back in 6 months.  Please come back sooner if needed.  Take care, Dr Jerline Pain  PLEASE NOTE:  If you had any lab tests please let us know if you have not heard back within a few days. You may see your results on mychart before we have a chance to review them but we will give you a call once they are reviewed by [place]. If we ordered any referrals today, please let us know if you have not heard from their office within the next week.   Please try these tips to maintain a healthy lifestyle:  Eat at least 3 REAL meals and 1-2 snacks per day.  Aim for no more than 5 hours between eating.  If you eat breakfast, please do so within one hour of getting up.   Each meal should contain half fruits/vegetables, one quarter protein, and one quarter carbs (no bigger than a computer mouse)  Cut down on sweet beverages. This includes juice, soda, and sweet tea.   Drink at least 1 glass of water with each meal and aim for at least 8 glasses per day  Exercise at least 150 minutes every week.

## 2020-12-12 NOTE — Assessment & Plan Note (Signed)
Check A1c next blood draw.

## 2020-12-12 NOTE — Assessment & Plan Note (Signed)
Doing well on Crestor 10 mg daily.  Check lipids next blood draw.

## 2020-12-12 NOTE — Assessment & Plan Note (Signed)
Recently prescribed gabapentin.  He has no longer taking this.  Neuropathic symptoms seem to be stable off meds.  We will continue with watchful waiting. We will check B12 with his next set of labs.

## 2020-12-12 NOTE — Assessment & Plan Note (Signed)
Slightly above goal per JNC 8.  We will continue amlodipine 5 mg daily and losartan 100 mg daily.  Continue home monitoring goal 150/90 or lower.

## 2020-12-12 NOTE — Progress Notes (Signed)
Thomas Li is a 80 y.o. male who presents today for an office visit. He is transferring care to [place] as his PCP.   Assessment/Plan:  Chronic Problems Addressed Today: Elevated PSA Follows with urology. PSA has been stable.   Neuropathy Recently prescribed gabapentin.  He has no longer taking this.  Neuropathic symptoms seem to be stable off meds.  We will continue with watchful waiting. We will check B12 with his next set of labs.   Hypercholesterolemia Doing well on Crestor 10 mg daily.  Check lipids next blood draw.  Hypertension Slightly above goal per JNC 8.  We will continue amlodipine 5 mg daily and losartan 100 mg daily.  Continue home monitoring goal 150/90 or lower.  Pre-diabetes Check A1c next blood draw.  Chronic combined systolic and diastolic heart failure (HCC) No signs of volume overload.     Subjective:  HPI:  See A/p for status of chronic conditions.  He is doing well today.  PMH:  The following were reviewed and entered/updated in epic: Past Medical History:  Diagnosis Date   Abnormal EKG    HX OF LEFT BUNDLE BRANCH BLOCK PER DR Kraemer NOTE 05-06-16 EPIC   Chronic combined systolic and diastolic heart failure ([place]) 07/18/2015   NO CURRENT CARDIOLOGIST ISSUE IMPROVED   Complication of anesthesia    Hypercholesterolemia    Hypertension    Nocturnal polyuria    Plantar fasciitis    RIGHT FOOT   PONV (postoperative nausea and vomiting)    NEEDS NAUSEA PRE MED   Weak urinary stream    Patient Active Problem List   Diagnosis Date Noted   Neuropathy 12/12/2020   Osteoarthritis of left knee 05/30/2020   GERD (gastroesophageal reflux disease) 09/27/2019   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   Pre-diabetes 06/17/2017   Chronic combined systolic and diastolic heart failure ([place]) 07/18/2015   Elevated PSA 09/05/2012   LBBB (left bundle branch block) 07/24/2011   BPH (benign prostatic hyperplasia) 05/09/2010   Hypertension     Hypercholesterolemia    Nocturnal polyuria    Past Surgical History:  Procedure Laterality Date   ALC REPAIR     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 30 tablet 0   carboxymethylcellulose (REFRESH PLUS) 0.5 % SOLN 1 drop 3 (three) times daily as needed.     Coenzyme Q10 (CO Q-10) 200 MG CAPS Take 200 mg by mouth daily.     famotidine (PEPCID) 20 MG tablet TAKE 1 TABLET BY MOUTH EVERY DAY 90 tablet 0   losartan (COZAAR) 100 MG tablet TAKE 1 TABLET BY MOUTH EVERY DAY 90 tablet 3   Multiple Vitamins-Minerals (PRESERVISION AREDS 2) CAPS Take by mouth daily.      rosuvastatin (CRESTOR) 10 MG tablet TAKE 1 TABLET BY MOUTH EVERY DAY 90 tablet 0   tolterodine (DETROL) 2 MG tablet Take 2 mg by mouth every morning.  UNABLE TO FIND LANTAPOST 0. 05% AT HS     No current facility-administered medications for this visit.    Allergies-reviewed and updated Allergies  Allergen Reactions   Lisinopril Cough    Social History   Socioeconomic History   Marital status: Married    Spouse name: Not on file   Number of children: Not on file   Years of education: Not on file   Highest education level: Not on file  Occupational History   Not on file  Tobacco Use   Smoking status: Former    Types: Cigarettes    Quit date: 01/27/1968    Years since quitting: 52.9   Smokeless tobacco: Never  Vaping Use   Vaping Use: Never used  Substance and Sexual Activity    Alcohol use: Yes    Comment: WINE Q NIGHT   Drug use: No   Sexual activity: Yes    Partners: Female  Other Topics Concern   Not on file  Social History Narrative   Not on file   Social Determinants of Health   Financial Resource Strain: Not on file  Food Insecurity: Not on file  Transportation Needs: Not on file  Physical Activity: Not on file  Stress: Not on file  Social Connections: Not on file          Objective:  Physical Exam: BP (!) 151/70   Pulse 92   Temp 98.3 F (36.8 C) (Temporal)   Ht 5\' 6"  (1.676 m)   Wt 176 lb 3.2 oz (79.9 kg)   SpO2 97%   BMI 28.44 kg/m   Gen: No acute distress, resting comfortably CV: Regular rate and rhythm with no murmurs appreciated Pulm: Normal work of breathing, clear to auscultation bilaterally with no crackles, wheezes, or rhonchi Neuro: Grossly normal, moves all extremities Psych: Normal affect and thought content      Time Spent: 45 minutes of total time was spent on the date of the encounter performing the following actions: chart review prior to seeing the patient including recent visit with previous PCP, obtaining history, performing a medically necessary exam, counseling on the treatment plan, placing orders, and documenting in our EHR.    Algis Greenhouse. Jerline Pain, MD 12/12/2020 8:44 AM

## 2020-12-25 ENCOUNTER — Other Ambulatory Visit: Payer: Self-pay | Admitting: Family Medicine

## 2020-12-31 ENCOUNTER — Encounter: Payer: Self-pay | Admitting: Nurse Practitioner

## 2020-12-31 DIAGNOSIS — H2513 Age-related nuclear cataract, bilateral: Secondary | ICD-10-CM | POA: Diagnosis not present

## 2020-12-31 DIAGNOSIS — H35363 Drusen (degenerative) of macula, bilateral: Secondary | ICD-10-CM | POA: Diagnosis not present

## 2020-12-31 DIAGNOSIS — H402231 Chronic angle-closure glaucoma, bilateral, mild stage: Secondary | ICD-10-CM | POA: Diagnosis not present

## 2020-12-31 DIAGNOSIS — H353131 Nonexudative age-related macular degeneration, bilateral, early dry stage: Secondary | ICD-10-CM | POA: Diagnosis not present

## 2021-01-22 ENCOUNTER — Other Ambulatory Visit: Payer: Self-pay | Admitting: Family Medicine

## 2021-01-22 ENCOUNTER — Telehealth: Payer: Self-pay | Admitting: Family Medicine

## 2021-01-22 NOTE — Progress Notes (Signed)
°  Care Management   Follow Up Note   01/22/2021 Name: Thomas Li MRN: [id number] DOB: 15-Jan-1941   Referred by: Vivi Barrack, MD Reason for referral : No chief complaint on file.   An unsuccessful telephone outreach was attempted today. The patient was referred to the case management team for assistance with care management and care coordination.   Follow Up Plan: No further follow up required:    [place]

## 2021-01-22 NOTE — Chronic Care Management (AMB) (Signed)
Care Management  Note   01/22/2021 Name: Thomas Li MRN: [id number] DOB: 29-Apr-1940  Thomas Li is a 80 y.o. year old male who is a primary care patient of Jerline Pain, Algis Greenhouse, MD. The care management team was consulted for assistance with chronic disease management and care coordination needs.   Mr. Summons was given information about Care Management services today including:  CCM service includes personalized support from designated clinical staff supervised by the physician, including individualized plan of care and coordination with other care providers 24/7 contact phone numbers for assistance for urgent and routine care needs. Service will only be billed when office clinical staff spend 20 minutes or more in a month to coordinate care. Only one practitioner may furnish and bill the service in a calendar month. The patient may stop CCM services at amy time (effective at the end of the month) by phone call to the office staff. The patient will be responsible for cost sharing (co-pay) or up to 20% of the service fee (after annual deductible is met)  Patient agreed to services and verbal consent obtained.  Follow up plan:   Face to Face appointment with care management team member scheduled for: 03/03/21 $RemoveBef'@930am'[id number]$   Thomas Li

## 2021-01-26 HISTORY — PX: OTHER SURGICAL HISTORY: SHX169

## 2021-01-30 ENCOUNTER — Other Ambulatory Visit: Payer: Self-pay | Admitting: Family Medicine

## 2021-02-25 ENCOUNTER — Other Ambulatory Visit: Payer: Self-pay | Admitting: Family Medicine

## 2021-02-26 ENCOUNTER — Telehealth: Payer: Self-pay | Admitting: Pharmacist

## 2021-02-26 NOTE — Progress Notes (Signed)
Chronic Care Management Pharmacy Note  03/03/2021 Name:  Thomas Li MRN:  [id number] DOB:  [birth year]  Summary: Initial visit with PharmD.  Elevated BP last time in office.  Plans to start checking at home.  Prediabetes with A1c increasing.  Discussed limiting sweets and excess carbs.  Neuropathy still exists he is tolerating ok with no meds at this time.  Recommendations/Changes made from today's visit: Monitor BP x 2 weeks - CMA to follow up  Plan: FU 2 weeks BP 6 months PharmD   Subjective: Thomas Li is an 81 y.o. year old male who is a primary patient of Vivi Barrack, MD.  The CCM team was consulted for assistance with disease management and care coordination needs.    Engaged with patient face to face for initial visit in response to provider referral for pharmacy case management and/or care coordination services.   Consent to Services:  The patient was given the following information about Chronic Care Management services today, agreed to services, and gave verbal consent: 1. CCM service includes personalized support from designated clinical staff supervised by the primary care provider, including individualized plan of care and coordination with other care providers 2. 24/7 contact phone numbers for assistance for urgent and routine care needs. 3. Service will only be billed when office clinical staff spend 20 minutes or more in a month to coordinate care. 4. Only one practitioner may furnish and bill the service in a calendar month. 5.The patient may stop CCM services at any time (effective at the end of the month) by phone call to the office staff. 6. The patient will be responsible for cost sharing (co-pay) of up to 20% of the service fee (after annual deductible is met). Patient agreed to services and consent obtained.  Patient Care Team: Vivi Barrack, MD as PCP - General (Family Medicine) Skeet Latch, MD as Consulting Physician (Cardiology) Monna Fam, MD as Consulting Physician (Ophthalmology) Festus Aloe, MD as Consulting Physician (Urology) Edythe Clarity, [facility] as Pharmacist (Pharmacist)  Recent office visits:  12/12/2020 OV (PCP) Vivi Barrack, MD; no medication changes indicated.   Recent consult visits:  None   Hospital visits:  None in previous 6 months   Objective:  Lab Results  Component Value Date   CREATININE 1.10 08/05/2020   BUN 16 08/05/2020   GFR 82.70 03/27/2020   GFRNONAA >60 08/05/2020   NA 134 (L) 08/05/2020   K 4.6 08/05/2020   CALCIUM 8.8 (L) 08/05/2020   CO2 27 08/05/2020   GLUCOSE 108 (H) 08/05/2020    Lab Results  Component Value Date/Time   HGBA1C 6.3 03/27/2020 08:45 AM   HGBA1C 6.0 (H) 09/27/2019 10:09 AM   GFR 82.70 03/27/2020 08:45 AM   GFR 76.03 06/10/2018 08:42 AM   MICROALBUR 0.3 09/27/2019 10:09 AM   MICROALBUR <0.7 06/16/2017 08:41 AM    Last diabetic Eye exam: No results found for: HMDIABEYEEXA  Last diabetic Foot exam: No results found for: HMDIABFOOTEX   Lab Results  Component Value Date   CHOL 116 09/27/2019   HDL 45 09/27/2019   LDLCALC 40 09/27/2019   LDLDIRECT 105.0 06/16/2017   TRIG 246 (H) 09/27/2019   CHOLHDL 2.6 09/27/2019    Hepatic Function Latest Ref Rng & Units 08/05/2020 03/27/2020 09/27/2019  Total Protein 6.5 - 8.1 g/dL 6.7 7.1 6.2  Albumin 3.5 - 5.0 g/dL 4.3 4.4 -  AST 15 - 41 U/L '17 20 20  ' ALT 0 - 44  U/L '27 25 30  ' Alk Phosphatase 38 - 126 U/L 64 64 -  Total Bilirubin 0.3 - 1.2 mg/dL 0.3 0.4 0.4  Bilirubin, Direct <=0.2 mg/dL - - -    Lab Results  Component Value Date/Time   TSH 3.08 09/27/2019 10:09 AM    CBC Latest Ref Rng & Units 08/05/2020 03/27/2020 09/27/2019  WBC 4.0 - 10.5 K/uL 9.1 7.1 4.5  Hemoglobin 13.0 - 17.0 g/dL 12.8(L) 14.5 14.8  Hematocrit 39.0 - 52.0 % 39.2 43.4 43.4  Platelets 150 - 400 K/uL 380 273.0 241    No results found for: VD25OH  Clinical ASCVD: No  The ASCVD Risk score (Arnett DK, et al., 2019) failed  to calculate for the following reasons:   The valid total cholesterol range is 130 to 320 mg/dL    Depression screen [facility] 2/9 12/12/2020 05/30/2020 03/27/2020  Decreased Interest 0 0 0  Down, Depressed, Hopeless 0 0 0  PHQ - 2 Score 0 0 0  Altered sleeping - - -  Tired, decreased energy - - -  Change in appetite - - -  Feeling bad or failure about yourself  - - -  Trouble concentrating - - -  Moving slowly or fidgety/restless - - -  Suicidal thoughts - - -  PHQ-9 Score - - -  Difficult doing work/chores - - -    Social History   Tobacco Use  Smoking Status Former   Types: Cigarettes   Quit date: 01/27/1968   Years since quitting: 53.1  Smokeless Tobacco Never   BP Readings from Last 3 Encounters:  12/12/20 (!) 151/70  08/05/20 (!) 112/59  05/30/20 130/68   Pulse Readings from Last 3 Encounters:  12/12/20 92  08/05/20 80  05/30/20 95   Wt Readings from Last 3 Encounters:  12/12/20 176 lb 3.2 oz (79.9 kg)  08/05/20 172 lb (78 kg)  05/30/20 175 lb 3.2 oz (79.5 kg)   BMI Readings from Last 3 Encounters:  12/12/20 28.44 kg/m  08/05/20 27.76 kg/m  05/30/20 28.28 kg/m    Assessment/Interventions: Review of patient past medical history, allergies, medications, health status, including review of consultants reports, laboratory and other test data, was performed as part of comprehensive evaluation and provision of chronic care management services.   SDOH:  (Social Determinants of Health) assessments and interventions performed: Yes  Financial Resource Strain: Low Risk    Difficulty of Paying Living Expenses: Not hard at all   Food Insecurity: No Food Insecurity   Worried About Charity fundraiser in the Last Year: Never true   Arboriculturist in the Last Year: Never true    SDOH Screenings   Alcohol Screen: Not on file  Depression (PHQ2-9): Low Risk    PHQ-2 Score: 0  Financial Resource Strain: Low Risk    Difficulty of Paying Living Expenses: Not hard at all  Food  Insecurity: No Food Insecurity   Worried About Charity fundraiser in the Last Year: Never true   Ran Out of Food in the Last Year: Never true  Housing: Not on file  Physical Activity: Not on file  Social Connections: Not on file  Stress: Not on file  Tobacco Use: Medium Risk   Smoking Tobacco Use: Former   Smokeless Tobacco Use: Never   Passive Exposure: Not on file  Transportation Needs: Not on file    [place]  Allergies  Allergen Reactions   Lisinopril Cough    Medications Reviewed Today  Reviewed by Edythe Clarity, RPH (Pharmacist) on 03/03/21 at 1443  Med List Status: <None>   Medication Order Taking? Sig Documenting Provider Last Dose Status Informant  amLODipine (NORVASC) 5 MG tablet [id number] Yes TAKE 1 TABLET (5 MG TOTAL) BY MOUTH DAILY. Vivi Barrack, MD Taking Active   carboxymethylcellulose (REFRESH PLUS) 0.5 % SOLN [id number] Yes 1 drop 3 (three) times daily as needed. [provider] Taking Active Spouse/Significant Other  Coenzyme Q10 (CO Q-10) 200 MG CAPS [id number] Yes Take 200 mg by mouth daily. [provider] Taking Active Spouse/Significant Other  famotidine (PEPCID) 20 MG tablet [id number] Yes TAKE 1 TABLET BY MOUTH EVERY DAY Vivi Barrack, MD Taking Active   losartan (COZAAR) 100 MG tablet [id number] Yes TAKE 1 TABLET BY MOUTH EVERY DAY Vivi Barrack, MD Taking Active   Multiple Vitamins-Minerals (PRESERVISION AREDS 2) CAPS [id number] Yes Take by mouth daily.  [provider] Taking Active Spouse/Significant Other           Med Note Tamsen Snider   Fri Jun 07, 2015  8:06 AM) Received from: [facility]  rosuvastatin (CRESTOR) 10 MG tablet [id number] Yes TAKE 1 TABLET BY MOUTH EVERY DAY Vivi Barrack, MD Taking Active   tolterodine (DETROL) 2 MG tablet [id number] Yes Take 2 mg by mouth every morning. [provider] Taking Active Spouse/Significant Other  UNABLE TO FIND [id number]  Yes LANTAPOST 0. 05% AT HS [provider] Taking Active Spouse/Significant Other            Patient Active Problem List   Diagnosis Date Noted   Neuropathy 12/12/2020   Osteoarthritis of left knee 05/30/2020   GERD (gastroesophageal reflux disease) 09/27/2019   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   Pre-diabetes 06/17/2017   Chronic combined systolic and diastolic heart failure ([place]) 07/18/2015   Elevated PSA 09/05/2012   LBBB (left bundle branch block) 07/24/2011   BPH (benign prostatic hyperplasia) 05/09/2010   Hypertension    Hypercholesterolemia    Nocturnal polyuria     Immunization History  Administered Date(s) Administered   Influenza, High Dose Seasonal PF 11/01/2017   Influenza-Unspecified 11/25/2020   Pneumococcal Conjugate-13 06/16/2017   Pneumococcal Polysaccharide-23 12/12/2020    Conditions to be addressed/monitored:  HTN, HF, HLD, Pre-DM, Neuropathy  Care Plan : General Pharmacy (Adult)  Updates made by Edythe Clarity, RPH since 03/03/2021 12:00 AM     Problem: HTN, HF, HLD, Pre-DM, Neuropathy   Priority: High  Onset Date: 03/03/2021     Long-Range Goal: Patient-Specific Goal   Start Date: 03/03/2021  Expected End Date: 08/31/2021  This Visit's Progress: On track  Priority: High  Note:   Current Barriers:  Unable to achieve control of BP   Pharmacist Clinical Goal(s):  Patient will achieve control of BP as evidenced by monitoring through collaboration with PharmD and provider.   Interventions: 1:1 collaboration with Vivi Barrack, MD regarding development and update of comprehensive plan of care as evidenced by provider attestation and co-signature Inter-disciplinary care team collaboration (see longitudinal plan of care) Comprehensive medication review performed; medication list updated in electronic medical record  Hypertension (BP goal <130/80) -Controlled -Current treatment: Amlodipine 71m daily Appropriate,  Query effective,  Losartan 107mdaily Appropriate, Query effective, -Medications previously tried: none noted -Current home readings: has not checked recently, did just get a newer BP cuff at home -Current dietary habits: eats well, trying to watch sweets/carbs etc. -Current exercise habits: still works  full time in construction, minimal outside of normal work but does have active job -Denies hypotensive/hypertensive symptoms -Educated on BP goals and benefits of medications for prevention of heart attack, stroke and kidney damage; Importance of home blood pressure monitoring; Symptoms of hypotension and importance of maintaining adequate hydration; -Counseled to monitor BP at home a few times per week and record, document, and provide log at future appointments -Recommended to continue current medication Will check in on BP in 2-3 weeks due to recent elevations in office, patient agreeable to monitor at home  Hyperlipidemia: (LDL goal < 70) -Controlled -Current treatment: Rosuvastatin 107m daily Appropriate, Effective, Safe, Accessible -Medications previously tried: none ntoed  -Current dietary patterns: see HTn -Current exercise habits: see HTN -Educated on Cholesterol goals;  Benefits of statin for ASCVD risk reduction; Importance of limiting foods high in cholesterol; -Recommended to continue current medication Most recent LDL is excellent, no issues with adverse effects  Pre-Diabetes (A1c goal <6.5%) -Controlled -Current medications: None noted -Medications previously tried: none  -Current home glucose readings fasting glucose: not checking post prandial glucose: not checking -Denies hypoglycemic/hyperglycemic symptoms -Educated on A1c and blood sugar goals; Carbs and effects on glucose -Counseled to check feet daily and get yearly eye exams -Recommend continue current management and routine screenings, continue to limit excess sweets and carbohydrates.  Heart Failure  (Goal: manage symptoms and prevent exacerbations) -Controlled -Last ejection fraction: 55-60% -Current treatment: Losartan 1063mdaily Appropriate, Effective, Safe, Accessible -Medications previously tried: none noted  -Current home BP/HR readings: not checked recently -Educated on Benefits of medications for managing symptoms and prolonging life Importance of weighing daily; if you gain more than 3 pounds in one day or 5 pounds in one week, contact providers Importance of blood pressure control -Recommended to continue current medication Followed by cardiology  Neuropathy?? (Goal: Minimize symptoms) -Not ideally controlled -Current treatment  None -Medications previously tried: gabapentin -Still has some neuropathy but denies burning and stinging. -Previously tried gabapentin but stopped when he had COVID for unknown reason.  -Recommended continue current management, explained use of gabapentin.  If pain worsens or starts to burn please [place] Patient Goals/Self-Care Activities Patient will:  - take medications as prescribed as evidenced by patient report and record review check blood pressure a few times per week, document, and provide at future appointments  Follow Up Plan: The care management team will reach out to the patient again over the next 180 days.       Medication Assistance: None required.  Patient affirms current coverage meets needs.  Compliance/Adherence/Medication fill history: Care Gaps: None at this time  Star-Rating Drugs: Losartan 100 mg last filled 01/30/2021 90 DS Rosuvastatin 10 mg last filled 11/28/2020 90 DS  Patient's preferred pharmacy is:  [place]: [phone]: [id number]:  [phone]: [phone]  [id number] [id number]: [phone]: [phone] pill box? No - takes out of vials Pt endorses 100% compliance  We discussed: Benefits of medication synchronization, packaging and delivery as well as enhanced pharmacist oversight with [facility]. Patient decided to: Continue current medication management strategy  Care Plan and Follow Up Patient Decision:  Patient agrees to Care Plan and Follow-up.  Plan: The care management team will  reach out to the patient again over the next 180 days.  Beverly Milch, PharmD Clinical Pharmacist  [facility] [phone]

## 2021-02-26 NOTE — Chronic Care Management (AMB) (Signed)
Chronic Care Management Pharmacy Assistant   Name: Thomas Li  MRN: [id number] DOB: [birth year]   Reason for Encounter: Chart Review For Initial Visit With Clinical Pharmacist   Conditions to be addressed/monitored: HTN, CHF, GERD, Neuropathy, OA, BPH, HLD, Pre Diabetes  Primary concerns for visit include: HTN   Recent office visits:  12/12/2020 OV (PCP) Vivi Barrack, MD; no medication changes indicated.  Recent consult visits:  None  Hospital visits:  None in previous 6 months  Medications: Outpatient Encounter Medications as of 02/26/2021  Medication Sig Note   amLODipine (NORVASC) 5 MG tablet TAKE 1 TABLET (5 MG TOTAL) BY MOUTH DAILY.    carboxymethylcellulose (REFRESH PLUS) 0.5 % SOLN 1 drop 3 (three) times daily as needed.    Coenzyme Q10 (CO Q-10) 200 MG CAPS Take 200 mg by mouth daily.    famotidine (PEPCID) 20 MG tablet TAKE 1 TABLET BY MOUTH EVERY DAY    losartan (COZAAR) 100 MG tablet TAKE 1 TABLET BY MOUTH EVERY DAY    Multiple Vitamins-Minerals (PRESERVISION AREDS 2) CAPS Take by mouth daily.  06/07/2015: Received from: [facility]   rosuvastatin (CRESTOR) 10 MG tablet TAKE 1 TABLET BY MOUTH EVERY DAY    tolterodine (DETROL) 2 MG tablet Take 2 mg by mouth every morning.    UNABLE TO FIND LANTAPOST 0. 05% AT HS    No facility-administered encounter medications on file as of 02/26/2021.   Current Medications: Amlodipine 5 mg last filled 01/22/2021 30 DS Losartan 100 mg last filled 01/30/2021 90 DS Famotidine 20 mg last filled 12/02/2020 90 DS Rosuvastatin 10 mg last filled 11/28/2020 90 DS Carboxymethylcellulose 0.5% solution Latanoprost 0.005% drops last filled 12/31/2020 90 DS Tolterodine 2 mg last filled 12/02/2020 90 DS Coenzyme Q10 200 mg Multiple Vitmains-Minerals (Preservision)  Patient Questions: Any changes in your medications or health? Patient's wife states the patient hasn't had any changes in his medications or  health.  Any side effects from any medications?  Patient's wife states the patient doesn't have any side effects from any of his medications.  Do you have any symptoms or problems not managed by your medications? Patient's wife states the patient doesn't have any symptoms or problems that are not currently managed by his medications.  Any concerns about your health right now? Patient's wife states they don't currently have any concerns with his health. He still works a full time job doing Architect.  Has your provider asked that you check blood pressure, blood sugar, or follow special diet at home? He checks his blood pressure occasionally. He does not check blood sugar. He does not follow any special diet.  Do you get any type of exercise on a regular basis? Patient currently works full time as a Nature conservation officer.  Can you think of a goal you would like to reach for your health? "Stay healthy."  Do you have any problems getting your medications? Patient's wife denies having any problems getting his medications.  Is there anything that you would like to discuss during the appointment?  Patient would like to discuss his medications.  Please bring medications and supplements to appointment  Care Gaps: Medicare Annual Wellness: Due now Hemoglobin A1C: 6.0% on 03/27/2020 Colonoscopy: Aged out  Future Appointments  Date Time Provider [place]  03/03/2021  9:30 AM [facility] CCM PHARMACIST [facility]  06/11/2021  8:20 AM Vivi Barrack, MD [facility]   Star Rating Drugs: Rosuvastatin 10 mg last filled 11/28/2020  90 DS Losartan 100 mg last filled 01/30/2021 90 DS  April D Calhoun, [place] Pharmacist Assistant [phone]

## 2021-03-03 ENCOUNTER — Ambulatory Visit (INDEPENDENT_AMBULATORY_CARE_PROVIDER_SITE_OTHER): Payer: Medicare Other | Admitting: Pharmacist

## 2021-03-03 ENCOUNTER — Other Ambulatory Visit: Payer: Self-pay

## 2021-03-03 DIAGNOSIS — R7303 Prediabetes: Secondary | ICD-10-CM

## 2021-03-03 DIAGNOSIS — I10 Essential (primary) hypertension: Secondary | ICD-10-CM

## 2021-03-03 DIAGNOSIS — I5042 Chronic combined systolic (congestive) and diastolic (congestive) heart failure: Secondary | ICD-10-CM

## 2021-03-03 DIAGNOSIS — E7800 Pure hypercholesterolemia, unspecified: Secondary | ICD-10-CM

## 2021-03-03 NOTE — Patient Instructions (Addendum)
Visit Information   Goals Addressed             This Visit's Progress    Track and Manage My Blood Pressure-Hypertension       Timeframe:  Long-Range Goal Priority:  High Start Date: 03/03/21                            Expected End Date:  08/31/21                     Follow Up Date 05/31/21    - check blood pressure 3 times per week - choose a place to take my blood pressure (home, clinic or office, retail store) - write blood pressure results in a log or diary    Why is this important?   You won't feel high blood pressure, but it can still hurt your blood vessels.  High blood pressure can cause heart or kidney problems. It can also cause a stroke.  Making lifestyle changes like losing a little weight or eating less salt will help.  Checking your blood pressure at home and at different times of the day can help to control blood pressure.  If the doctor prescribes medicine remember to take it the way the doctor ordered.  Call the office if you cannot afford the medicine or if there are questions about it.     Notes:        Patient Care Plan: General Pharmacy (Adult)     Problem Identified: HTN, HF, HLD, Pre-DM, Neuropathy   Priority: High  Onset Date: 03/03/2021     Long-Range Goal: Patient-Specific Goal   Start Date: 03/03/2021  Expected End Date: 08/31/2021  This Visit's Progress: On track  Priority: High  Note:   Current Barriers:  Unable to achieve control of BP   Pharmacist Clinical Goal(s):  Patient will achieve control of BP as evidenced by monitoring through collaboration with PharmD and provider.   Interventions: 1:1 collaboration with Vivi Barrack, MD regarding development and update of comprehensive plan of care as evidenced by provider attestation and co-signature Inter-disciplinary care team collaboration (see longitudinal plan of care) Comprehensive medication review performed; medication list updated in electronic medical record  Hypertension (BP  goal <130/80) -Controlled -Current treatment: Amlodipine 5mg  daily Appropriate, Query effective,  Losartan 100mg  daily Appropriate, Query effective, -Medications previously tried: none noted -Current home readings: has not checked recently, did just get a newer BP cuff at home -Current dietary habits: eats well, trying to watch sweets/carbs etc. -Current exercise habits: still works full time in [employer], minimal outside of normal work but does have active job -Denies hypotensive/hypertensive symptoms -Educated on BP goals and benefits of medications for prevention of heart attack, stroke and kidney damage; Importance of home blood pressure monitoring; Symptoms of hypotension and importance of maintaining adequate hydration; -Counseled to monitor BP at home a few times per week and record, document, and provide log at future appointments -Recommended to continue current medication Will check in on BP in 2-3 weeks due to recent elevations in office, patient agreeable to monitor at home  Hyperlipidemia: (LDL goal < 70) -Controlled -Current treatment: Rosuvastatin 10mg  daily Appropriate, Effective, Safe, Accessible -Medications previously tried: none ntoed  -Current dietary patterns: see HTn -Current exercise habits: see HTN -Educated on Cholesterol goals;  Benefits of statin for ASCVD risk reduction; Importance of limiting foods high in cholesterol; -Recommended to continue current medication Most recent LDL  is excellent, no issues with adverse effects  Pre-Diabetes (A1c goal <6.5%) -Controlled -Current medications: None noted -Medications previously tried: none  -Current home glucose readings fasting glucose: not checking post prandial glucose: not checking -Denies hypoglycemic/hyperglycemic symptoms -Educated on A1c and blood sugar goals; Carbs and effects on glucose -Counseled to check feet daily and get yearly eye exams -Recommend continue current management and  routine screenings, continue to limit excess sweets and carbohydrates.  Heart Failure (Goal: manage symptoms and prevent exacerbations) -Controlled -Last ejection fraction: 55-60% -Current treatment: Losartan 100mg  daily Appropriate, Effective, Safe, Accessible -Medications previously tried: none noted  -Current home BP/HR readings: not checked recently -Educated on Benefits of medications for managing symptoms and prolonging life Importance of weighing daily; if you gain more than 3 pounds in one day or 5 pounds in one week, contact providers Importance of blood pressure control -Recommended to continue current medication Followed by cardiology  Neuropathy?? (Goal: Minimize symptoms) -Not ideally controlled -Current treatment  None -Medications previously tried: gabapentin -Still has some neuropathy but denies burning and stinging. -Previously tried gabapentin but stopped when he had COVID for unknown reason.  -Recommended continue current management, explained use of gabapentin.  If pain worsens or starts to burn please contact us.  Patient Goals/Self-Care Activities Patient will:  - take medications as prescribed as evidenced by patient report and record review check blood pressure a few times per week, document, and provide at future appointments  Follow Up Plan: The care management team will reach out to the patient again over the next 180 days.      Mr. Mcphatter was given information about Chronic Care Management services today including:  CCM service includes personalized support from designated clinical staff supervised by his physician, including individualized plan of care and coordination with other care providers 24/7 contact phone numbers for assistance for urgent and routine care needs. Standard insurance, coinsurance, copays and deductibles apply for chronic care management only during months in which we provide at least 20 minutes of these services. Most insurances  cover these services at 100%, however patients may be responsible for any copay, coinsurance and/or deductible if applicable. This service may help you avoid the need for more expensive face-to-face services. Only one practitioner may furnish and bill the service in a calendar month. The patient may stop CCM services at any time (effective at the end of the month) by phone call to the office staff.  Patient agreed to services and verbal consent obtained.   The patient verbalized understanding of instructions, educational materials, and care plan provided today and agreed to receive a mailed copy of patient instructions, educational materials, and care plan.  Telephone follow up appointment with pharmacy team member scheduled for: 6 months  Edythe Clarity, [place], PharmD Clinical Pharmacist  [facility] [phone]

## 2021-03-10 ENCOUNTER — Other Ambulatory Visit: Payer: Self-pay | Admitting: Family Medicine

## 2021-03-10 ENCOUNTER — Telehealth: Payer: Self-pay

## 2021-03-10 MED ORDER — FAMOTIDINE 20 MG PO TABS: 20.0000 mg | ORAL_TABLET | Freq: Every day | ORAL | 0 refills | Status: DC

## 2021-03-10 MED ORDER — ROSUVASTATIN CALCIUM 10 MG PO TABS: 10.0000 mg | ORAL_TABLET | Freq: Every day | ORAL | 0 refills | Status: DC

## 2021-03-10 NOTE — Telephone Encounter (Signed)
Patient requesting refill on Crestor and Pepcid to be sent to [facility] for refill.   Thanks!   Beverly Milch, PharmD Clinical Pharmacist  [facility] [phone]  Sent refills into pharmacy as requested

## 2021-03-10 NOTE — Telephone Encounter (Signed)
-----   Message from Edythe Clarity, [facility] sent at 03/10/2021  8:48 AM EST ----- Patient requesting refill on Crestor and Pepcid to be sent to [facility] for refill.  Thanks!  Beverly Milch, PharmD Clinical Pharmacist  [facility] [phone]

## 2021-03-25 ENCOUNTER — Other Ambulatory Visit: Payer: Self-pay | Admitting: Family Medicine

## 2021-03-25 DIAGNOSIS — E7800 Pure hypercholesterolemia, unspecified: Secondary | ICD-10-CM

## 2021-03-25 DIAGNOSIS — I5042 Chronic combined systolic (congestive) and diastolic (congestive) heart failure: Secondary | ICD-10-CM | POA: Diagnosis not present

## 2021-03-25 DIAGNOSIS — I10 Essential (primary) hypertension: Secondary | ICD-10-CM | POA: Diagnosis not present

## 2021-04-19 ENCOUNTER — Other Ambulatory Visit: Payer: Self-pay | Admitting: Family Medicine

## 2021-05-20 ENCOUNTER — Other Ambulatory Visit: Payer: Self-pay | Admitting: Family Medicine

## 2021-05-30 ENCOUNTER — Telehealth: Payer: Self-pay | Admitting: Pharmacist

## 2021-05-30 NOTE — Progress Notes (Signed)
Chronic Care Management Pharmacy Assistant   Name: Thomas Li  MRN: [id number] DOB: 07-03-1940   Reason for Encounter: Hypertension Adherence Call    Recent office visits:  None  Recent consult visits:  None  Hospital visits:  None in previous 6 months  Medications: Outpatient Encounter Medications as of 05/30/2021  Medication Sig Note   amLODipine (NORVASC) 5 MG tablet TAKE 1 TABLET (5 MG TOTAL) BY MOUTH DAILY.    carboxymethylcellulose (REFRESH PLUS) 0.5 % SOLN 1 drop 3 (three) times daily as needed.    Coenzyme Q10 (CO Q-10) 200 MG CAPS Take 200 mg by mouth daily.    famotidine (PEPCID) 20 MG tablet Take 1 tablet (20 mg total) by mouth daily.    losartan (COZAAR) 100 MG tablet TAKE 1 TABLET BY MOUTH EVERY DAY    Multiple Vitamins-Minerals (PRESERVISION AREDS 2) CAPS Take by mouth daily.  06/07/2015: Received from: [facility]   rosuvastatin (CRESTOR) 10 MG tablet Take 1 tablet (10 mg total) by mouth daily.    tolterodine (DETROL) 2 MG tablet Take 2 mg by mouth every morning.    UNABLE TO FIND LANTAPOST 0. 05% AT HS    No facility-administered encounter medications on file as of 05/30/2021.   Reviewed chart prior to disease state call. Spoke with patient regarding BP  Recent Office Vitals: BP Readings from Last 3 Encounters:  12/12/20 (!) 151/70  08/05/20 (!) 112/59  05/30/20 130/68   Pulse Readings from Last 3 Encounters:  12/12/20 92  08/05/20 80  05/30/20 95    Wt Readings from Last 3 Encounters:  12/12/20 176 lb 3.2 oz (79.9 kg)  08/05/20 172 lb (78 kg)  05/30/20 175 lb 3.2 oz (79.5 kg)     Kidney Function Lab Results  Component Value Date/Time   CREATININE 1.10 08/05/2020 11:34 AM   CREATININE 0.87 03/27/2020 08:45 AM   CREATININE 0.90 09/27/2019 10:09 AM   CREATININE 0.86 07/25/2015 11:38 AM   GFR 82.70 03/27/2020 08:45 AM   GFRNONAA >60 08/05/2020 11:34 AM       Latest Ref Rng & Units 08/05/2020   11:34 AM 03/27/2020     8:45 AM 09/27/2019   10:09 AM  BMP  Glucose 70 - 99 mg/dL 108   99   151    BUN 8 - 23 mg/dL '16   15   11    '$ Creatinine 0.61 - 1.24 mg/dL 1.10   0.87   0.90    BUN/Creat Ratio 6 - 22 (calc)   NOT APPLICABLE    Sodium 465 - 145 mmol/L 134   137   140    Potassium 3.5 - 5.1 mmol/L 4.6   4.5   4.3    Chloride 98 - 111 mmol/L 99   102   103    CO2 22 - 32 mmol/L '27   28   27    '$ Calcium 8.9 - 10.3 mg/dL 8.8   9.7   9.2      Current antihypertensive regimen:  Losartan 100 mg once a day  How often are you checking your Blood Pressure? 3-5x per week  Current home BP readings: 121-138/70-76  What recent interventions/DTPs have been made by any provider to improve Blood Pressure control since last CPP Visit: No recent interventions or DTPs.  Any recent hospitalizations or ED visits since last visit with CPP? No  What diet changes have been made to improve Blood Pressure Control?  Patient  states he eats a healthy diet.  What exercise is being done to improve your Blood Pressure Control?  Patient states he works daily and stays active a lot.  Adherence Review: Is the patient currently on ACE/ARB medication? Yes Does the patient have >5 day gap between last estimated fill dates? No  Care Gaps: Medicare Annual Wellness: Overdue Hemoglobin A1C: 6.3% on 03/27/2020 Colonoscopy: Aged out  Future Appointments  Date Time Provider [place]  06/11/2021  8:20 AM Vivi Barrack, MD [facility]  09/02/2021  2:15 PM [facility] CCM PHARMACIST [facility]   Star Rating Drugs: Losartan 100 mg last filled 05/01/2021 90 DS Rosuvastatin 10 mg last filled 05/20/2021 90 DS  April D Calhoun, [place] Pharmacist Assistant [phone]

## 2021-06-02 DIAGNOSIS — L821 Other seborrheic keratosis: Secondary | ICD-10-CM | POA: Diagnosis not present

## 2021-06-02 DIAGNOSIS — L814 Other melanin hyperpigmentation: Secondary | ICD-10-CM | POA: Diagnosis not present

## 2021-06-02 DIAGNOSIS — D225 Melanocytic nevi of trunk: Secondary | ICD-10-CM | POA: Diagnosis not present

## 2021-06-02 DIAGNOSIS — L570 Actinic keratosis: Secondary | ICD-10-CM | POA: Diagnosis not present

## 2021-06-11 ENCOUNTER — Ambulatory Visit (INDEPENDENT_AMBULATORY_CARE_PROVIDER_SITE_OTHER): Payer: Medicare Other | Admitting: Family Medicine

## 2021-06-11 ENCOUNTER — Encounter: Payer: Self-pay | Admitting: Family Medicine

## 2021-06-11 VITALS — BP 132/73 | HR 81 | Temp 98.2°F | Ht 66.0 in | Wt 182.6 lb

## 2021-06-11 DIAGNOSIS — I10 Essential (primary) hypertension: Secondary | ICD-10-CM

## 2021-06-11 DIAGNOSIS — M1712 Unilateral primary osteoarthritis, left knee: Secondary | ICD-10-CM | POA: Diagnosis not present

## 2021-06-11 DIAGNOSIS — E7800 Pure hypercholesterolemia, unspecified: Secondary | ICD-10-CM

## 2021-06-11 DIAGNOSIS — R011 Cardiac murmur, unspecified: Secondary | ICD-10-CM | POA: Diagnosis not present

## 2021-06-11 DIAGNOSIS — K219 Gastro-esophageal reflux disease without esophagitis: Secondary | ICD-10-CM | POA: Diagnosis not present

## 2021-06-11 DIAGNOSIS — R7303 Prediabetes: Secondary | ICD-10-CM

## 2021-06-11 DIAGNOSIS — R972 Elevated prostate specific antigen [PSA]: Secondary | ICD-10-CM | POA: Diagnosis not present

## 2021-06-11 MED ORDER — CELECOXIB 200 MG PO CAPS: 200.0000 mg | ORAL_CAPSULE | Freq: Two times a day (BID) | ORAL | 0 refills | Status: DC | PRN

## 2021-06-11 NOTE — Assessment & Plan Note (Signed)
Flareup recently.  He will schedule appointment with orthopedic soon.  We will start Celebrex 200 mg twice daily as needed. ?

## 2021-06-11 NOTE — Assessment & Plan Note (Signed)
Check A1c next blood draw. ?

## 2021-06-11 NOTE — Progress Notes (Signed)
? ?  Thomas Li is a 81 y.o. male who presents today for an office visit. ? ?Assessment/Plan:  ?Chronic Problems Addressed Today: ?Elevated PSA ?We will follow-up with urology soon.  His PSA has been stable. ? ?Systolic murmur ?He has had murmur for several years.  Likely has worsening mitral regurgitation and possible aortic stenosis.  Does not currently have any symptoms.  No dyspnea on exertion, chest pain, or syncope or presyncopal symptoms.  We discussed echocardiogram however given his lack of symptoms we will continue with watchful waiting for now. ? ?Osteoarthritis of left knee ?Flareup recently.  He will schedule appointment with orthopedic soon.  We will start Celebrex 200 mg twice daily as needed. ? ?GERD (gastroesophageal reflux disease) ?Stable on Pepcid 20 mg daily. ? ?Hypercholesterolemia ?We will check lipids with next blood draw.  He is on Crestor 10 mg daily. ? ?Hypertension ?Slightly above goal today though home readings have been at goal though 120s over 70s to 80s.  We will continue amlodipine 5 mg daily and losartan 100 mg daily. ? ?Pre-diabetes ?Check A1c next blood draw. ? ? ?  ?Subjective:  ?HPI: ? ?See A/p for status of chronic conditions.   ? ?   ?  ?Objective:  ?Physical Exam: ?BP 132/73 Comment: Home Reading  Pulse 81   Temp 98.2 ?F (36.8 ?C) (Temporal)   Ht '5\' 6"'$  (1.676 m)   Wt 182 lb 9.6 oz (82.8 kg)   SpO2 97%   BMI 29.47 kg/m?   ?Gen: No acute distress, resting comfortably ?CV: Regular rate and rhythm with 2/6 systolic murmur appreciated ?Pulm: Normal work of breathing, clear to auscultation bilaterally with no crackles, wheezes, or rhonchi ?Neuro: Grossly normal, moves all extremities ?Psych: Normal affect and thought content ? ?   ? ?Algis Greenhouse. Jerline Pain, MD ?06/11/2021 8:57 AM  ?

## 2021-06-11 NOTE — Assessment & Plan Note (Signed)
Stable on Pepcid 20 mg daily. 

## 2021-06-11 NOTE — Assessment & Plan Note (Signed)
We will check lipids with next blood draw.  He is on Crestor 10 mg daily. ?

## 2021-06-11 NOTE — Assessment & Plan Note (Signed)
Slightly above goal today though home readings have been at goal though 120s over 70s to 80s.  We will continue amlodipine 5 mg daily and losartan 100 mg daily. ?

## 2021-06-11 NOTE — Assessment & Plan Note (Signed)
We will follow-up with urology soon.  His PSA has been stable. ?

## 2021-06-11 NOTE — Assessment & Plan Note (Signed)
He has had murmur for several years.  Likely has worsening mitral regurgitation and possible aortic stenosis.  Does not currently have any symptoms.  No dyspnea on exertion, chest pain, or syncope or presyncopal symptoms.  We discussed echocardiogram however given his lack of symptoms we will continue with watchful waiting for now. ?

## 2021-06-11 NOTE — Patient Instructions (Signed)
It was very nice to see you today! ? ?Please try the Celebrex to help with your knees. ? ?Please let me know if you develop any sort of severe chest pain, shortness of breath, or dizziness. ? ?Keep an eye on your blood pressure at home and let us know if it is persistently elevated to 140/90 or higher. ? ?We will see you back in 6 months. Please come back to see [place] sooner if needed.  We will check blood work at your office visit.  ? ?Take care, ?Dr Jerline Pain ? ?PLEASE NOTE: ? ?If you had any lab tests please let us know if you have not heard back within a few days. You may see your results on mychart before we have a chance to review them but we will give you a call once they are reviewed by [place]. If we ordered any referrals today, please let us know if you have not heard from their office within the next week.  ? ?Please try these tips to maintain a healthy lifestyle: ? ?Eat at least 3 REAL meals and 1-2 snacks per day.  Aim for no more than 5 hours between eating.  If you eat breakfast, please do so within one hour of getting up.  ? ?Each meal should contain half fruits/vegetables, one quarter protein, and one quarter carbs (no bigger than a computer mouse) ? ?Cut down on sweet beverages. This includes juice, soda, and sweet tea.  ? ?Drink at least 1 glass of water with each meal and aim for at least 8 glasses per day ? ?Exercise at least 150 minutes every week.   ?

## 2021-06-12 ENCOUNTER — Other Ambulatory Visit: Payer: Self-pay | Admitting: Family Medicine

## 2021-06-16 DIAGNOSIS — M25562 Pain in left knee: Secondary | ICD-10-CM | POA: Diagnosis not present

## 2021-06-27 DIAGNOSIS — M25562 Pain in left knee: Secondary | ICD-10-CM | POA: Diagnosis not present

## 2021-06-27 DIAGNOSIS — M13862 Other specified arthritis, left knee: Secondary | ICD-10-CM | POA: Diagnosis not present

## 2021-07-07 DIAGNOSIS — L820 Inflamed seborrheic keratosis: Secondary | ICD-10-CM | POA: Diagnosis not present

## 2021-07-07 DIAGNOSIS — C44329 Squamous cell carcinoma of skin of other parts of face: Secondary | ICD-10-CM | POA: Diagnosis not present

## 2021-07-07 DIAGNOSIS — D0439 Carcinoma in situ of skin of other parts of face: Secondary | ICD-10-CM | POA: Diagnosis not present

## 2021-07-07 DIAGNOSIS — D492 Neoplasm of unspecified behavior of bone, soft tissue, and skin: Secondary | ICD-10-CM | POA: Diagnosis not present

## 2021-07-08 ENCOUNTER — Other Ambulatory Visit: Payer: Self-pay | Admitting: Family Medicine

## 2021-07-16 ENCOUNTER — Ambulatory Visit (INDEPENDENT_AMBULATORY_CARE_PROVIDER_SITE_OTHER): Payer: Medicare Other | Admitting: Family Medicine

## 2021-07-16 ENCOUNTER — Encounter: Payer: Self-pay | Admitting: Family Medicine

## 2021-07-16 VITALS — BP 130/78 | HR 62 | Temp 98.2°F | Ht 66.0 in | Wt 176.5 lb

## 2021-07-16 DIAGNOSIS — E7800 Pure hypercholesterolemia, unspecified: Secondary | ICD-10-CM

## 2021-07-16 DIAGNOSIS — R7303 Prediabetes: Secondary | ICD-10-CM | POA: Diagnosis not present

## 2021-07-16 DIAGNOSIS — I10 Essential (primary) hypertension: Secondary | ICD-10-CM | POA: Diagnosis not present

## 2021-07-16 DIAGNOSIS — Z01818 Encounter for other preprocedural examination: Secondary | ICD-10-CM | POA: Diagnosis not present

## 2021-07-16 LAB — COMPREHENSIVE METABOLIC PANEL
ALT: 35 U/L (ref 0–53)
AST: 23 U/L (ref 0–37)
Albumin: 4.3 g/dL (ref 3.5–5.2)
Alkaline Phosphatase: 44 U/L (ref 39–117)
BUN: 12 mg/dL (ref 6–23)
CO2: 28 mEq/L (ref 19–32)
Calcium: 9.4 mg/dL (ref 8.4–10.5)
Chloride: 102 mEq/L (ref 96–112)
Creatinine, Ser: 0.88 mg/dL (ref 0.40–1.50)
GFR: 81.66 mL/min (ref >60.00)
Glucose, Bld: 102 mg/dL — ABNORMAL HIGH (ref 70–99)
Potassium: 4.3 mEq/L (ref 3.5–5.1)
Sodium: 137 mEq/L (ref 135–145)
Total Bilirubin: 0.5 mg/dL (ref 0.2–1.2)
Total Protein: 6.8 g/dL (ref 6.0–8.3)

## 2021-07-16 LAB — CBC WITH DIFFERENTIAL/PLATELET
Basophils Absolute: 0 10*3/uL (ref 0.0–0.1)
Basophils Relative: 0.8 % (ref 0.0–3.0)
Eosinophils Absolute: 0.1 10*3/uL (ref 0.0–0.7)
Eosinophils Relative: 2.5 % (ref 0.0–5.0)
HCT: 42.9 % (ref 39.0–52.0)
Hemoglobin: 14.2 g/dL (ref 13.0–17.0)
Lymphocytes Relative: 26 % (ref 12.0–46.0)
Lymphs Abs: 1.1 10*3/uL (ref 0.7–4.0)
MCHC: 33.2 g/dL (ref 30.0–36.0)
MCV: 104.7 fl — ABNORMAL HIGH (ref 78.0–100.0)
Monocytes Absolute: 0.5 10*3/uL (ref 0.1–1.0)
Monocytes Relative: 12.5 % — ABNORMAL HIGH (ref 3.0–12.0)
Neutro Abs: 2.5 10*3/uL (ref 1.4–7.7)
Neutrophils Relative %: 58.2 % (ref 43.0–77.0)
Platelets: 256 10*3/uL (ref 150.0–400.0)
RBC: 4.1 Mil/uL — ABNORMAL LOW (ref 4.22–5.81)
RDW: 14.2 % (ref 11.5–15.5)
WBC: 4.3 10*3/uL (ref 4.0–10.5)

## 2021-07-16 LAB — LIPID PANEL
Cholesterol: 114 mg/dL (ref 0–200)
HDL: 58.7 mg/dL (ref >39.00)
LDL Cholesterol: 40 mg/dL (ref 0–99)
NonHDL: 54.88
Total CHOL/HDL Ratio: 2
Triglycerides: 73 mg/dL (ref 0.0–149.0)
VLDL: 14.6 mg/dL (ref 0.0–40.0)

## 2021-07-16 LAB — HEMOGLOBIN A1C: Hgb A1c MFr Bld: 6.6 % — ABNORMAL HIGH (ref 4.6–6.5)

## 2021-07-16 LAB — TSH: TSH: 2.79 u[IU]/mL (ref 0.35–5.50)

## 2021-07-16 NOTE — Progress Notes (Signed)
Subjective:     Patient ID: Thomas Li, male    DOB: 1940-12-29, 81 y.o.   MRN: [id number]  Chief Complaint  Patient presents with   Surgical Clearance    Need surgical clearance for knee replacement  Need EKG and Fasting labs     HPI Needs surg clearance for L knee replacement.  Aug 22,2023.  Dr. Alvan Dame  HTN.-130-145/64-70.  No ha/dizziness/cp/palp/cough/sob.  Some swelling LLE-got knee injection and resolved.  [place].  Active.  No steady exercise but always moving.  Can walk flight stairs.  Murmur for many yrs.  Saw Dr. Mare Ferrari in past. Has been stable.  No symptoms.  Also LBBB.  Has had perfusion studies/echos in past.  Grade 1 DD, and reduced EF.  No ischemia PreDM-working on TLC   There are no preventive care reminders to display for this patient.  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 30 tablet 0   carboxymethylcellulose (REFRESH PLUS) 0.5 % SOLN 1 drop 3 (three) times daily as  needed.     celecoxib (CELEBREX) 200 MG capsule Take 1 capsule (200 mg total) by mouth 2 (two) times daily as needed. 60 capsule 0   Coenzyme Q10 (CO Q-10) 200 MG CAPS Take 200 mg by mouth daily.     famotidine (PEPCID) 20 MG tablet Take 1 tablet (20 mg total) by mouth daily. 90 tablet 0   gabapentin (NEURONTIN) 300 MG capsule Take 300 mg by mouth 3 (three) times daily.     latanoprost (XALATAN) 0.005 % ophthalmic solution SMARTSIG:1 Drop(s) In Eye(s) Every Evening     losartan (COZAAR) 100 MG tablet TAKE 1 TABLET BY MOUTH EVERY DAY 90 tablet 3   Multiple Vitamins-Minerals (PRESERVISION AREDS 2) CAPS Take by mouth daily.      rosuvastatin (CRESTOR) 10 MG tablet Take 1 tablet (10 mg total) by mouth daily. 90 tablet 0   tamsulosin (FLOMAX) 0.4 MG CAPS capsule Take by mouth.     tolterodine (DETROL) 2 MG tablet Take 2 mg by mouth every morning.     UNABLE TO FIND LANTAPOST 0. 05% AT HS     No facility-administered medications prior to visit.    No Known Allergies ROS neg/noncontributory except as noted HPI/below No GI complaints No depression/SI      Objective:     BP 130/78   Pulse 62  Temp 98.2 F (36.8 C) (Temporal)   Ht '5\' 6"'$  (1.676 m)   Wt 176 lb 8 oz (80.1 kg)   SpO2 93%   BMI 28.49 kg/m  Wt Readings from Last 3 Encounters:  07/16/21 176 lb 8 oz (80.1 kg)  06/11/21 182 lb 9.6 oz (82.8 kg)  12/12/20 176 lb 3.2 oz (79.9 kg)    Physical Exam   Gen: WDWN NAD WM HEENT: NCAT, conjunctiva not injected, sclera nonicteric NECK:  supple, no thyromegaly, no nodes, no carotid bruits CARDIAC: RRR, S1S2+, 2/6 sys murmur. Rad to carotids. DP 2+B LUNGS: CTAB. No wheezes ABDOMEN:  BS+, soft, NTND, No HSM, no masses EXT:  no edema MSK: no gross abnormalities.  NEURO: A&O x3.  CN II-XII intact.  PSYCH: normal mood. Good eye contact    Reviewd chart.  Chronic combined heart failure based on echo report.  No symptoms ever.   In ER 08/05/20-some slight anemia  EKG: NSR.  LBBB.   Assessment & Plan:   Problem List Items Addressed This Visit       Cardiovascular and Mediastinum   Hypertension   Relevant Orders   Comprehensive metabolic panel   TSH   CBC with Differential/Platelet     Other   Hypercholesterolemia   Relevant Orders   Lipid panel   TSH   Pre-diabetes   Relevant Orders   Hemoglobin A1c   Other Visit Diagnoses     Pre-op exam    -  Primary   Relevant Orders   EKG 12-Lead (Completed)      Pre op clearance OA L knee for TKA-pt at average risk for  almost 81yo male. Will check cbc d/t an er visit and some anemia.  Cmp.  Presuming no abnormalities, ok for surgery HTN-chronic/well controlled. Check cbc,cmp,tsh HLD-chronic.  Controlled.  Check lipids, tsh preDM-working on TLC.  Check A1C  Labs stable-ok for surgery  No orders of the defined types were placed in this encounter.   Wellington [place], MD

## 2021-07-17 ENCOUNTER — Encounter: Payer: Self-pay | Admitting: *Deleted

## 2021-08-06 ENCOUNTER — Other Ambulatory Visit: Payer: Self-pay | Admitting: Family Medicine

## 2021-08-11 ENCOUNTER — Other Ambulatory Visit: Payer: Self-pay | Admitting: Family Medicine

## 2021-08-11 NOTE — Telephone Encounter (Signed)
Pt requesting refill for Gabapentin. Last OV 06/11/2021.

## 2021-08-16 ENCOUNTER — Other Ambulatory Visit: Payer: Self-pay | Admitting: Family Medicine

## 2021-08-26 NOTE — Progress Notes (Deleted)
Chronic Care Management Pharmacy Note  08/26/2021 Name:  BEE HAMMERSCHMIDT MRN:  [id number] DOB:  04-17-1940  Summary: Initial visit with PharmD.  Elevated BP last time in office.  Plans to start checking at home.  Prediabetes with A1c increasing.  Discussed limiting sweets and excess carbs.  Neuropathy still exists he is tolerating ok with no meds at this time.  Recommendations/Changes made from today's visit: Monitor BP x 2 weeks - CMA to follow up  Plan: FU 2 weeks BP 6 months PharmD   Subjective: Thomas Li is an 81 y.o. year old male who is a primary patient of Vivi Barrack, MD.  The CCM team was consulted for assistance with disease management and care coordination needs.    Engaged with patient face to face for initial visit in response to provider referral for pharmacy case management and/or care coordination services.   Consent to Services:  The patient was given the following information about Chronic Care Management services today, agreed to services, and gave verbal consent: 1. CCM service includes personalized support from designated clinical staff supervised by the primary care provider, including individualized plan of care and coordination with other care providers 2. 24/7 contact phone numbers for assistance for urgent and routine care needs. 3. Service will only be billed when office clinical staff spend 20 minutes or more in a month to coordinate care. 4. Only one practitioner may furnish and bill the service in a calendar month. 5.The patient may stop CCM services at any time (effective at the end of the month) by phone call to the office staff. 6. The patient will be responsible for cost sharing (co-pay) of up to 20% of the service fee (after annual deductible is met). Patient agreed to services and consent obtained.  Patient Care Team: Vivi Barrack, MD as PCP - General (Family Medicine) Skeet Latch, MD as Consulting Physician (Cardiology) Monna Fam, MD as Consulting Physician (Ophthalmology) Festus Aloe, MD as Consulting Physician (Urology) Edythe Clarity, [facility] as Pharmacist (Pharmacist)  Recent office visits:  12/12/2020 OV (PCP) Vivi Barrack, MD; no medication changes indicated.   Recent consult visits:  None   Hospital visits:  None in previous 6 months   Objective:  Lab Results  Component Value Date   CREATININE 0.88 07/16/2021   BUN 12 07/16/2021   GFR 81.66 07/16/2021   GFRNONAA >60 08/05/2020   NA 137 07/16/2021   K 4.3 07/16/2021   CALCIUM 9.4 07/16/2021   CO2 28 07/16/2021   GLUCOSE 102 (H) 07/16/2021    Lab Results  Component Value Date/Time   HGBA1C 6.6 (H) 07/16/2021 10:53 AM   HGBA1C 6.3 03/27/2020 08:45 AM   GFR 81.66 07/16/2021 10:53 AM   GFR 82.70 03/27/2020 08:45 AM   MICROALBUR 0.3 09/27/2019 10:09 AM   MICROALBUR <0.7 06/16/2017 08:41 AM    Last diabetic Eye exam: No results found for: "HMDIABEYEEXA"  Last diabetic Foot exam: No results found for: "HMDIABFOOTEX"   Lab Results  Component Value Date   CHOL 114 07/16/2021   HDL 58.70 07/16/2021   LDLCALC 40 07/16/2021   LDLDIRECT 105.0 06/16/2017   TRIG 73.0 07/16/2021   CHOLHDL 2 07/16/2021       Latest Ref Rng & Units 07/16/2021   10:53 AM 08/05/2020   11:34 AM 03/27/2020    8:45 AM  Hepatic Function  Total Protein 6.0 - 8.3 g/dL 6.8  6.7  7.1   Albumin 3.5 - 5.2 g/dL  4.3  4.3  4.4   AST 0 - 37 U/L '23  17  20   ' ALT 0 - 53 U/L 35  27  25   Alk Phosphatase 39 - 117 U/L 44  64  64   Total Bilirubin 0.2 - 1.2 mg/dL 0.5  0.3  0.4     Lab Results  Component Value Date/Time   TSH 2.79 07/16/2021 10:53 AM   TSH 3.08 09/27/2019 10:09 AM       Latest Ref Rng & Units 07/16/2021   10:53 AM 08/05/2020   11:34 AM 03/27/2020    8:45 AM  CBC  WBC 4.0 - 10.5 K/uL 4.3  9.1  7.1   Hemoglobin 13.0 - 17.0 g/dL 14.2  12.8  14.5   Hematocrit 39.0 - 52.0 % 42.9  39.2  43.4   Platelets 150.0 - 400.0 K/uL 256.0  380  273.0      No results found for: "VD25OH"  Clinical ASCVD: No  The ASCVD Risk score (Arnett DK, et al., 2019) failed to calculate for the following reasons:   The valid total cholesterol range is 130 to 320 mg/dL       12/12/2020    8:05 AM 05/30/2020    1:17 PM 03/27/2020    8:09 AM  Depression screen PHQ 2/9  Decreased Interest 0 0 0  Down, Depressed, Hopeless 0 0 0  PHQ - 2 Score 0 0 0    Social History   Tobacco Use  Smoking Status Former   Types: Cigarettes   Quit date: 01/27/1968   Years since quitting: 53.6  Smokeless Tobacco Never   BP Readings from Last 3 Encounters:  07/16/21 130/78  06/11/21 132/73  12/12/20 (!) 151/70   Pulse Readings from Last 3 Encounters:  07/16/21 62  06/11/21 81  12/12/20 92   Wt Readings from Last 3 Encounters:  07/16/21 176 lb 8 oz (80.1 kg)  06/11/21 182 lb 9.6 oz (82.8 kg)  12/12/20 176 lb 3.2 oz (79.9 kg)   BMI Readings from Last 3 Encounters:  07/16/21 28.49 kg/m  06/11/21 29.47 kg/m  12/12/20 28.44 kg/m    Assessment/Interventions: Review of patient past medical history, allergies, medications, health status, including review of consultants reports, laboratory and other test data, was performed as part of comprehensive evaluation and provision of chronic care management services.   SDOH:  (Social Determinants of Health) assessments and interventions performed: Yes  Financial Resource Strain: Low Risk  (03/03/2021)   Overall Financial Resource Strain (CARDIA)    Difficulty of Paying Living Expenses: Not hard at all   Food Insecurity: No Food Insecurity (03/03/2021)   Hunger Vital Sign    Worried About Running Out of Food in the Last Year: Never true    Ran Out of Food in the Last Year: Never true    SDOH Screenings   Alcohol Screen: Not on file  Depression (PHQ2-9): Low Risk  (12/12/2020)   Depression (PHQ2-9)    PHQ-2 Score: 0  Financial Resource Strain: Low Risk  (03/03/2021)   Overall Financial Resource Strain (CARDIA)     Difficulty of Paying Living Expenses: Not hard at all  Food Insecurity: No Food Insecurity (03/03/2021)   Hunger Vital Sign    Worried About Running Out of Food in the Last Year: Never true    Ran Out of Food in the Last Year: Never true  Housing: Not on file  Physical Activity: Not on file  Social Connections: Not on file  Stress: Not on file  Tobacco Use: Medium Risk (07/16/2021)   Patient History    Smoking Tobacco Use: Former    Smokeless Tobacco Use: Never    Passive Exposure: Not on file  Transportation Needs: Not on file    [place]  No Known Allergies   Medications Reviewed Today     Reviewed by Zacarias Pontes, CMA (Certified Medical Assistant) on 07/16/21 at [phone]  Med List Status: <None>   Medication Order Taking? Sig Documenting Provider Last Dose Status Informant  amLODipine (NORVASC) 5 MG tablet [id number] Yes TAKE 1 TABLET (5 MG TOTAL) BY MOUTH DAILY. Vivi Barrack, MD Taking Active   carboxymethylcellulose (REFRESH PLUS) 0.5 % SOLN [id number] Yes 1 drop 3 (three) times daily as needed. [provider] Taking Active Spouse/Significant Other  celecoxib (CELEBREX) 200 MG capsule [id number] Yes Take 1 capsule (200 mg total) by mouth 2 (two) times daily as needed. Vivi Barrack, MD Taking Active   Coenzyme Q10 (CO Q-10) 200 MG CAPS [id number] Yes Take 200 mg by mouth daily. [provider] Taking Active Spouse/Significant Other  famotidine (PEPCID) 20 MG tablet [id number] Yes Take 1 tablet (20 mg total) by mouth daily. Vivi Barrack, MD Taking Active   gabapentin (NEURONTIN) 300 MG capsule [id number] Yes Take 300 mg by mouth 3 (three) times daily. [provider] Taking Active   latanoprost (XALATAN) 0.005 % ophthalmic solution [id number] Yes SMARTSIG:1 Drop(s) In Eye(s) Every Evening [provider] Taking Active   losartan (COZAAR) 100 MG tablet [id number] Yes TAKE 1 TABLET BY MOUTH EVERY DAY Vivi Barrack, MD Taking Active    Multiple Vitamins-Minerals (PRESERVISION AREDS 2) CAPS [id number] Yes Take by mouth daily.  [provider] Taking Active Spouse/Significant Other           Med Note Tamsen Snider   Fri Jun 07, 2015  8:06 AM) Received from: [facility]  rosuvastatin (CRESTOR) 10 MG tablet [id number] Yes Take 1 tablet (10 mg total) by mouth daily. Vivi Barrack, MD Taking Active   tamsulosin [facility]) 0.4 MG CAPS capsule [id number] Yes Take by mouth. [provider] Taking Active   tolterodine (DETROL) 2 MG tablet [id number] Yes Take 2 mg by mouth every morning. [provider] Taking Active Spouse/Significant Other            Patient Active Problem List   Diagnosis Date Noted   Systolic murmur 14/23/9532   Neuropathy 12/12/2020   Osteoarthritis of left knee 05/30/2020   GERD (gastroesophageal reflux disease) 09/27/2019   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   Pre-diabetes 06/17/2017   Chronic combined systolic and diastolic heart failure ([place]) 07/18/2015   Elevated PSA 09/05/2012   LBBB (left bundle branch block) 07/24/2011   BPH (benign prostatic hyperplasia) 05/09/2010   Hypertension    Hypercholesterolemia    Nocturnal polyuria     Immunization History  Administered Date(s) Administered   Influenza, High Dose Seasonal PF 11/01/2017   Influenza-Unspecified 11/25/2020   Pneumococcal Conjugate-13 06/16/2017   Pneumococcal Polysaccharide-23 12/12/2020    Conditions to be addressed/monitored:  HTN, HF, HLD, Pre-DM, Neuropathy  There are no care plans that you recently modified to display for this patient.     Medication Assistance: None required.  Patient affirms current coverage meets needs.  Compliance/Adherence/Medication fill history: Care Gaps: None at this time  Star-Rating Drugs: Losartan 100 mg last filled 01/30/2021 90 DS Rosuvastatin 10 mg last filled 11/28/2020 90  DS  Patient's preferred pharmacy  is:  [facility] [address], [place]. [place] [address] Phone: [phone] Fax: [phone]  [facility] [address], [place] [place] [address] Phone: [phone] Fax: [phone]  [id number] [place]: [phone]: [phone] Uses pill box? No - takes out of vials Pt endorses 100% compliance  We discussed: Benefits of medication synchronization, packaging and delivery as well as enhanced pharmacist oversight with [employer]. Patient decided to: Continue current medication management strategy  Care Plan and Follow Up Patient Decision:  Patient agrees to Care Plan and Follow-up.  Plan: The care management team will reach out to the patient again over the next 180 days.  CBeverly Milch PharmD Clinical Pharmacist  [facility]([phone]   Current Barriers:  Unable to achieve control of BP   Pharmacist Clinical Goal(s):  Patient will achieve control of BP as evidenced by monitoring through collaboration with PharmD and provider.   Interventions: 1:1 collaboration with PVivi Barrack MD regarding development and update of comprehensive plan of care as evidenced by provider attestation and co-signature Inter-disciplinary care team collaboration (see longitudinal plan of care) Comprehensive medication review performed; medication list updated in electronic medical record  Hypertension (BP goal <130/80) -Controlled -Current treatment: Amlodipine 512mdaily Appropriate, Query effective,  Losartan 10062maily Appropriate, Query effective, -Medications previously tried: none noted -Current home readings: has not checked recently, did just get a newer BP cuff at home -Current dietary habits: eats well, trying to watch  sweets/carbs etc. -Current exercise habits: still works full time in [employer] outside of normal work but does have active job -Denies hypotensive/hypertensive symptoms -Educated on BP goals and benefits of medications for prevention of heart attack, stroke and kidney damage; Importance of home blood pressure monitoring; Symptoms of hypotension and importance of maintaining adequate hydration; -Counseled to monitor BP at home a few times per week and record, document, and provide log at future appointments -Recommended to continue current medication Will check in on BP in 2-3 weeks due to recent elevations in office, patient agreeable to monitor at home  Hyperlipidemia: (LDL goal < 70) -Controlled -Current treatment: Rosuvastatin 51m79mily Appropriate, Effective, Safe, Accessible -Medications previously tried: none ntoed  -Current dietary patterns: see HTn -Current exercise habits: see HTN -Educated on Cholesterol goals;  Benefits of statin for ASCVD risk reduction; Importance of limiting foods high in cholesterol; -Recommended to continue current medication Most recent LDL is excellent, no issues with adverse effects  Pre-Diabetes (A1c goal <6.5%) -Controlled -Current medications: None noted -Medications previously tried: none  -Current home glucose readings fasting glucose: not checking post prandial glucose: not checking -Denies hypoglycemic/hyperglycemic symptoms -Educated on A1c and blood sugar goals; Carbs and effects on glucose -Counseled to check feet daily and get yearly eye exams -Recommend continue current management and routine screenings, continue to limit excess sweets and carbohydrates.  Heart Failure (Goal: manage symptoms and prevent exacerbations) -Controlled -Last ejection fraction: 55-60% -Current treatment: Losartan 100mg18mly Appropriate, Effective, Safe, Accessible -Medications previously tried: none noted  -Current home BP/HR readings:  not checked recently -Educated on Benefits of medications for managing symptoms and prolonging life Importance of weighing daily; if you gain more than 3 pounds in one day or 5 pounds in one week, contact providers Importance of blood pressure control -Recommended to continue  current medication Followed by cardiology  Neuropathy?? (Goal: Minimize symptoms) -Not ideally controlled -Current treatment  None -Medications previously tried: gabapentin -Still has some neuropathy but denies burning and stinging. -Previously tried gabapentin but stopped when he had COVID for unknown reason.  -Recommended continue current management, explained use of gabapentin.  If pain worsens or starts to burn please contact us.  Patient Goals/Self-Care Activities Patient will:  - take medications as prescribed as evidenced by patient report and record review check blood pressure a few times per week, document, and provide at future appointments  Follow Up Plan: The care management team will reach out to the patient again over the next 180 days.

## 2021-09-02 ENCOUNTER — Other Ambulatory Visit: Payer: Self-pay | Admitting: Family Medicine

## 2021-09-02 ENCOUNTER — Telehealth: Payer: Medicare Other

## 2021-09-05 ENCOUNTER — Other Ambulatory Visit (HOSPITAL_COMMUNITY): Payer: Medicare Other

## 2021-09-16 ENCOUNTER — Ambulatory Visit: Admit: 2021-09-16 | Payer: Medicare Other | Admitting: Orthopedic Surgery

## 2021-09-16 SURGERY — ARTHROPLASTY, KNEE, TOTAL
Anesthesia: Spinal | Site: Knee | Laterality: Left

## 2021-09-22 DIAGNOSIS — M25562 Pain in left knee: Secondary | ICD-10-CM | POA: Diagnosis not present

## 2021-09-26 ENCOUNTER — Other Ambulatory Visit: Payer: Self-pay | Admitting: Family Medicine

## 2021-09-28 ENCOUNTER — Other Ambulatory Visit: Payer: Self-pay | Admitting: Family Medicine

## 2021-10-20 ENCOUNTER — Encounter: Payer: Self-pay | Admitting: *Deleted

## 2021-10-23 DIAGNOSIS — R972 Elevated prostate specific antigen [PSA]: Secondary | ICD-10-CM | POA: Diagnosis not present

## 2021-10-23 DIAGNOSIS — R3915 Urgency of urination: Secondary | ICD-10-CM | POA: Diagnosis not present

## 2021-10-23 DIAGNOSIS — N401 Enlarged prostate with lower urinary tract symptoms: Secondary | ICD-10-CM | POA: Diagnosis not present

## 2021-10-28 ENCOUNTER — Telehealth: Payer: Self-pay | Admitting: Family Medicine

## 2021-10-28 NOTE — Telephone Encounter (Signed)
Caller States: -PCP Team completed surgery clearance for patient. -Original surgery date had to be postponed to 10/31 -Specialty needs "updated" information" -Specialty office did not provide new paperwork or any other instructions. -Specialty Office: [facility] - Dr. Paralee Cancel  Caller Requests: -PCP team to send updated clearance and records for new surgery date.

## 2021-10-28 NOTE — Telephone Encounter (Signed)
No surgery clearance form received

## 2021-10-29 NOTE — Telephone Encounter (Signed)
Form placed on PCP office for review

## 2021-11-02 ENCOUNTER — Other Ambulatory Visit: Payer: Self-pay | Admitting: Family Medicine

## 2021-11-03 NOTE — Telephone Encounter (Signed)
Form Faxed on 10/31/2021 to fax# [phone]

## 2021-11-04 ENCOUNTER — Telehealth: Payer: Self-pay | Admitting: Family Medicine

## 2021-11-04 NOTE — Telephone Encounter (Signed)
Spoke with patient, patient notified, was prescribed Celebrex on 06/11/2021 for knee pain  Stated did not pick up prescription till now. Will take prescription if knee pain continue  Have knee surgery coming up

## 2021-11-04 NOTE — Telephone Encounter (Signed)
Patient would like to know why was he prescribed  celecoxib (CELEBREX) 200 MG capsule   What's it for and why should he take it as he has never taken it before.

## 2021-11-06 DIAGNOSIS — L821 Other seborrheic keratosis: Secondary | ICD-10-CM | POA: Diagnosis not present

## 2021-11-06 DIAGNOSIS — L578 Other skin changes due to chronic exposure to nonionizing radiation: Secondary | ICD-10-CM | POA: Diagnosis not present

## 2021-11-06 DIAGNOSIS — Z08 Encounter for follow-up examination after completed treatment for malignant neoplasm: Secondary | ICD-10-CM | POA: Diagnosis not present

## 2021-11-06 DIAGNOSIS — L570 Actinic keratosis: Secondary | ICD-10-CM | POA: Diagnosis not present

## 2021-11-06 DIAGNOSIS — Z85828 Personal history of other malignant neoplasm of skin: Secondary | ICD-10-CM | POA: Diagnosis not present

## 2021-11-13 NOTE — Patient Instructions (Addendum)
DUE TO COVID-19 ONLY TWO VISITORS  (aged 80 and older)  ARE ALLOWED TO COME WITH YOU AND STAY IN THE WAITING ROOM ONLY DURING PRE OP AND PROCEDURE.   **NO VISITORS ARE ALLOWED IN THE SHORT STAY AREA OR RECOVERY ROOM!!**  IF YOU WILL BE ADMITTED INTO THE HOSPITAL YOU ARE ALLOWED ONLY FOUR SUPPORT PEOPLE DURING VISITATION HOURS ONLY (7 AM -8PM)   The support person(s) must pass our screening, gel in and out, and wear a mask at all times, including in the patient's room. Patients must also wear a mask when staff or their support person are in the room. Visitors GUEST BADGE MUST BE WORN VISIBLY  One adult visitor may remain with you overnight and MUST be in the room by 8 P.M.     Your procedure is scheduled on: 11/25/21   Report to [facility] Main Entrance    Report to admitting at  7:35 AM   Call this number if you have problems the morning of surgery [phone]   Do not eat food :After Midnight.   After Midnight you may have the following liquids until _7:00_____ AM/  DAY OF SURGERY  Water Black Coffee (sugar ok, NO MILK/CREAM OR CREAMERS)  Tea (sugar ok, NO MILK/CREAM OR CREAMERS) regular and decaf                             Plain Jell-O (NO RED)                                           Fruit ices (not with fruit pulp, NO RED)                                     Popsicles (NO RED)                                                                  Juice: apple, WHITE grape, WHITE cranberry Sports drinks like Gatorade (NO RED)     The day of surgery:  Drink ONE (1) Pre-Surgery Clear Ensure at 6:45 AM the morning of surgery. Drink in one sitting. Do not sip.  This drink was given to you during your hospital  pre-op appointment visit. Nothing else to drink after completing the  Pre-Surgery Clear Ensure at 7:00 AM          If you have questions, please contact your surgeon's office.   FOLLOW BOWEL PREP AND ANY ADDITIONAL PRE OP INSTRUCTIONS YOU RECEIVED FROM YOUR  SURGEON'S OFFICE!!!     Oral Hygiene is also important to reduce your risk of infection.                                    Remember - BRUSH YOUR TEETH THE MORNING OF SURGERY WITH YOUR REGULAR TOOTHPASTE   Do NOT smoke after Midnight   Take these medicines the morning of surgery with A SIP OF WATER: Gabapentin- Neurontin  Rosuvastatin- Crestor                                                                                                                            Amlodipine- Norvasc                                                                                                                             Famotidine-Pepcid                                                                                                                             Tolterodine- Detrol   Bring CPAP mask and tubing day of surgery.                              You may not have any metal on your body including , jewelry, and body piercing             Do not wear lotions, powders, perfumes/cologne, or deodorant               Men may shave face and neck.   Do not bring valuables to the hospital. [place].   Contacts, dentures or bridgework may not be worn into surgery.   Bring small overnight bag day of surgery.   DO NOT [place]. PHARMACY WILL DISPENSE MEDICATIONS LISTED ON YOUR MEDICATION LIST TO YOU DURING YOUR ADMISSION [place]!       Special Instructions: Bring a copy of your healthcare power of attorney and living will documents     the day of surgery if you haven't scanned them before.              Please  read over the following fact sheets you were given: IF YOU HAVE QUESTIONS ABOUT YOUR PRE-OP INSTRUCTIONS PLEASE CALL [phone]     [facility] - Preparing for Surgery Before  surgery, you can play an important role.  Because skin is not sterile, your skin needs to be as free of germs as possible.  You can reduce the number of germs on your skin by washing with CHG (chlorahexidine gluconate) soap before surgery.  CHG is an antiseptic cleaner which kills germs and bonds with the skin to continue killing germs even after washing. Please DO NOT use if you have an allergy to CHG or antibacterial soaps.  If your skin becomes reddened/irritated stop using the CHG and inform your nurse when you arrive at Short Stay..  You may shave your face/neck. Please follow these instructions carefully:  1.  Shower with CHG Soap the night before surgery and the  morning of Surgery.  2.  If you choose to wash your hair, wash your hair first as usual with your  normal  shampoo.  3.  After you shampoo, rinse your hair and body thoroughly to remove the  shampoo.                            4.  Use CHG as you would any other liquid soap.  You can apply chg directly  to the skin and wash                       Gently with a scrungie or clean washcloth.  5.  Apply the CHG Soap to your body ONLY FROM THE NECK DOWN.   Do not use on face/ open                           Wound or open sores. Avoid contact with eyes, ears mouth and genitals (private parts).                       Wash face,  Genitals (private parts) with your normal soap.             6.  Wash thoroughly, paying special attention to the area where your surgery  will be performed.  7.  Thoroughly rinse your body with warm water from the neck down.  8.  DO NOT shower/wash with your normal soap after using and rinsing off  the CHG Soap.                9.  Pat yourself dry with a clean towel.            10.  Wear clean pajamas.            11.  Place clean sheets on your bed the night of your first shower and do not  sleep with pets. Day of Surgery : Do not apply any lotions/deodorants the morning of surgery.  Please wear clean clothes to the  hospital/surgery center.  FAILURE TO FOLLOW THESE INSTRUCTIONS MAY RESULT IN THE CANCELLATION OF YOUR SURGERY  ________________________________________________________________________   Incentive Spirometer  An incentive spirometer is a tool that can help keep your lungs clear and active. This tool measures how well you are filling your lungs with each breath. Taking long deep breaths may help reverse or decrease the chance of developing breathing (pulmonary) problems (especially infection) following: A long period  of time when you are unable to move or be active. BEFORE THE PROCEDURE  If the spirometer includes an indicator to show your best effort, your nurse or respiratory therapist will set it to a desired goal. If possible, sit up straight or lean slightly forward. Try not to slouch. Hold the incentive spirometer in an upright position. INSTRUCTIONS FOR USE  Sit on the edge of your bed if possible, or sit up as far as you can in bed or on a chair. Hold the incentive spirometer in an upright position. Breathe out normally. Place the mouthpiece in your mouth and seal your lips tightly around it. Breathe in slowly and as deeply as possible, raising the piston or the ball toward the top of the column. Hold your breath for 3-5 seconds or for as long as possible. Allow the piston or ball to fall to the bottom of the column. Remove the mouthpiece from your mouth and breathe out normally. Rest for a few seconds and repeat Steps 1 through 7 at least 10 times every 1-2 hours when you are awake. Take your time and take a few normal breaths between deep breaths. The spirometer may include an indicator to show your best effort. Use the indicator as a goal to work toward during each repetition. After each set of 10 deep breaths, practice coughing to be sure your lungs are clear. If you have an incision (the cut made at the time of surgery), support your incision when coughing by placing a pillow or  rolled up towels firmly against it. Once you are able to get out of bed, walk around indoors and cough well. You may stop using the incentive spirometer when instructed by your caregiver.  RISKS AND COMPLICATIONS Take your time so you do not get dizzy or light-headed. If you are in pain, you may need to take or ask for pain medication before doing incentive spirometry. It is harder to take a deep breath if you are having pain. AFTER USE Rest and breathe slowly and easily. It can be helpful to keep track of a log of your progress. Your caregiver can provide you with a simple table to help with this. If you are using the spirometer at home, follow these instructions: [place] IF:  You are having difficultly using the spirometer. You have trouble using the spirometer as often as instructed. Your pain medication is not giving enough relief while using the spirometer. You develop fever of 100.5 F (38.1 C) or higher. SEEK IMMEDIATE MEDICAL CARE IF:  You cough up bloody sputum that had not been present before. You develop fever of 102 F (38.9 C) or greater. You develop worsening pain at or near the incision site. MAKE SURE YOU:  Understand these instructions. Will watch your condition. Will get help right away if you are not doing well or get worse. Document Released: 05/25/2006 Document Revised: 04/06/2011 Document Reviewed: 07/26/2006 [employer] Patient Information 2014 [place], [place].   ________________________________________________________________________

## 2021-11-14 ENCOUNTER — Encounter (HOSPITAL_COMMUNITY)
Admission: RE | Admit: 2021-11-14 | Discharge: 2021-11-14 | Disposition: A | Discharge status: Home / Self Care | Payer: Medicare Other | Source: Ambulatory Visit | Attending: Orthopedic Surgery | Admitting: Orthopedic Surgery

## 2021-11-14 ENCOUNTER — Encounter (HOSPITAL_COMMUNITY): Payer: Self-pay

## 2021-11-14 ENCOUNTER — Other Ambulatory Visit: Payer: Self-pay

## 2021-11-14 DIAGNOSIS — I10 Essential (primary) hypertension: Secondary | ICD-10-CM | POA: Diagnosis not present

## 2021-11-14 DIAGNOSIS — Z01812 Encounter for preprocedural laboratory examination: Secondary | ICD-10-CM | POA: Diagnosis not present

## 2021-11-14 DIAGNOSIS — Z01818 Encounter for other preprocedural examination: Secondary | ICD-10-CM

## 2021-11-14 LAB — BASIC METABOLIC PANEL
Anion gap: 7 (ref 5–15)
BUN: 13 mg/dL (ref 8–23)
CO2: 25 mmol/L (ref 22–32)
Calcium: 8.7 mg/dL — ABNORMAL LOW (ref 8.9–10.3)
Chloride: 102 mmol/L (ref 98–111)
Creatinine, Ser: 0.93 mg/dL (ref 0.61–1.24)
GFR, Estimated: >60 mL/min (ref >60)
Glucose, Bld: 138 mg/dL — ABNORMAL HIGH (ref 70–99)
Potassium: 4.1 mmol/L (ref 3.5–5.1)
Sodium: 134 mmol/L — ABNORMAL LOW (ref 135–145)

## 2021-11-14 LAB — CBC
HCT: 42.4 % (ref 39.0–52.0)
Hemoglobin: 14.1 g/dL (ref 13.0–17.0)
MCH: 35.7 pg — ABNORMAL HIGH (ref 26.0–34.0)
MCHC: 33.3 g/dL (ref 30.0–36.0)
MCV: 107.3 fL — ABNORMAL HIGH (ref 80.0–100.0)
Platelets: 250 10*3/uL (ref 150–400)
RBC: 3.95 MIL/uL — ABNORMAL LOW (ref 4.22–5.81)
RDW: 13 % (ref 11.5–15.5)
WBC: 5.2 10*3/uL (ref 4.0–10.5)
nRBC: 0 % (ref 0.0–0.2)

## 2021-11-14 LAB — SURGICAL PCR SCREEN
MRSA, PCR: NEGATIVE
Staphylococcus aureus: POSITIVE — AB

## 2021-11-14 NOTE — Progress Notes (Signed)
Anesthesia note:  Bowel prep reminder:NA  PCP - Dr. Zack Seal Cardiologist -none now Other-   Chest x-ray - no EKG - 07/16/21-epic Stress Test - 2017 ECHO - 2017 with Dr. Berneice Gandy Cardiac Cath - no  Pacemaker/ICD device last checked:NA  Sleep Study - no CPAP -   Pt is pre diabetic-NA Fasting Blood Sugar -  Checks Blood Sugar _____  Blood Thinner:NA Blood Thinner Instructions: Aspirin Instructions: Last Dose:  Anesthesia review: no Pt has no SOB with activities.  Patient denies shortness of breath, fever, cough and chest pain at PAT appointment   Patient verbalized understanding of instructions that were given to them at the PAT appointment. Patient was also instructed that they will need to review over the PAT instructions again at home before surgery. Yes his wife was with him

## 2021-11-24 NOTE — H&P (Signed)
TOTAL KNEE ADMISSION H&P  Patient is being admitted for left total knee arthroplasty.  Subjective:  Chief Complaint:left knee pain.  HPI: Thomas Li, 81 y.o. male, has a history of pain and functional disability in the left knee due to arthritis and has failed non-surgical conservative treatments for greater than 12 weeks to includeNSAID's and/or analgesics, corticosteriod injections, and activity modification.  Onset of symptoms was gradual, starting 2 years ago with gradually worsening course since that time. The patient noted prior procedures on the knee to include  arthroscopy and menisectomy on the left knee(s).  Patient currently rates pain in the left knee(s) at 8 out of 10 with activity. Patient has worsening of pain with activity and weight bearing, pain that interferes with activities of daily living, and pain with passive range of motion.  Patient has evidence of joint space narrowing by imaging studies. There is no active infection.  Patient Active Problem List   Diagnosis Date Noted   Systolic murmur 84/13/2440   Neuropathy 12/12/2020   Osteoarthritis of left knee 05/30/2020   GERD (gastroesophageal reflux disease) 09/27/2019   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   Pre-diabetes 06/17/2017   Chronic combined systolic and diastolic heart failure ([place]) 07/18/2015   Elevated PSA 09/05/2012   LBBB (left bundle branch block) 07/24/2011   BPH (benign prostatic hyperplasia) 05/09/2010   Hypertension    Hypercholesterolemia    Nocturnal polyuria    Past Medical History:  Diagnosis Date   Abnormal EKG    HX OF LEFT BUNDLE BRANCH BLOCK PER DR Mahomet NOTE 05-06-16 EPIC   Chronic combined systolic and diastolic heart failure ([place]) 07/18/2015   NO CURRENT CARDIOLOGIST ISSUE IMPROVED   Glaucoma    Hypercholesterolemia    Hypertension    Nocturnal polyuria    Plantar fasciitis    RIGHT FOOT   PONV (postoperative nausea and vomiting)    NEEDS NAUSEA PRE MED    Weak urinary stream     Past Surgical History:  Procedure Laterality Date   ALC REPAIR Left 1985   ACL   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   Current Outpatient Medications  Medication Sig Dispense Refill Last Dose   amLODipine (NORVASC) 5 MG tablet TAKE 1 TABLET (5 MG TOTAL) BY MOUTH DAILY. 90 tablet 1    Ascorbic Acid (VITAMIN C) POWD Take 1 Dose by mouth daily.      calcium carbonate (TUMS - DOSED IN MG ELEMENTAL CALCIUM) 500 MG chewable tablet Chew 1 tablet by mouth daily as needed for indigestion or heartburn.      carboxymethylcellulose (REFRESH PLUS) 0.5 % SOLN Place 1 drop into both eyes at bedtime.      Coenzyme Q10 (COQ-10) 200 MG CAPS Take 200 mg by mouth at bedtime.      famotidine (PEPCID) 20 MG tablet TAKE 1 TABLET BY MOUTH EVERY DAY 90 tablet 0    fluticasone (FLONASE) 50 MCG/ACT nasal spray Place 1 spray into both nostrils daily as needed for allergies or rhinitis.      gabapentin (NEURONTIN) 300 MG capsule TAKE 1 CAPSULE BY MOUTH THREE TIMES A DAY (Patient taking  differently: Take 300 mg by mouth 2 (two) times daily.) 90 capsule 3    latanoprost (XALATAN) 0.005 % ophthalmic solution Place 1 drop into both eyes at bedtime.      losartan (COZAAR) 100 MG tablet TAKE 1 TABLET BY MOUTH EVERY DAY 90 tablet 3    Multiple Vitamin (MULTIVITAMIN WITH MINERALS) TABS tablet Take 2 tablets by mouth 2 (two) times daily.      rosuvastatin (CRESTOR) 10 MG tablet TAKE 1 TABLET BY MOUTH EVERY DAY 90 tablet 0    tolterodine (DETROL) 2 MG tablet Take 2 mg by mouth 2 (two) times daily.      VITAMIN D PO Take 1 capsule by mouth daily.       celecoxib (CELEBREX) 200 MG capsule TAKE 1 CAPSULE BY MOUTH 2 TIMES DAILY AS NEEDED. (Patient not taking: Reported on 11/11/2021) 60 capsule 0 Not Taking   No Known Allergies  Social History   Tobacco Use   Smoking status: Former    Packs/day: 0.50    Years: 3.00    Total pack years: 1.50    Types: Cigarettes    Quit date: 01/27/1968    Years since quitting: 53.8   Smokeless tobacco: Never  Substance Use Topics   Alcohol use: Yes    Comment: WINE Q NIGHT    Family History  Problem Relation Age of Onset   Heart disease Father 26     Review of Systems  Constitutional:  Negative for chills and fever.  Respiratory:  Negative for cough and shortness of breath.   Cardiovascular:  Negative for chest pain.  Gastrointestinal:  Negative for nausea and vomiting.  Musculoskeletal:  Positive for arthralgias.     Objective:  Physical Exam Well nourished and well developed. General: Alert and oriented x3, cooperative and pleasant, no acute distress. Head: normocephalic, atraumatic, neck supple. Eyes: EOMI.  Musculoskeletal: Left knee exam: No palpable effusion, warmth or erythema Slight flexion contracture Flexion to 120 degrees with tightness over the anterior medial aspect knee Tenderness mainly medially and to a lesser extent anteriorly with some mild crepitation Stable medial lateral collateral ligaments  Calves soft and nontender. Motor function intact in LE. Strength 5/5 LE bilaterally. Neuro: Distal pulses 2+. Sensation to light touch intact in LE.  Vital signs in last 24 hours:    Labs:   Estimated body mass index is 28.73 kg/m as calculated from the following:   Height as of 11/14/21: '5\' 6"'$  (1.676 m).   Weight as of 11/14/21: 80.7 kg.   Imaging Review Plain radiographs demonstrate severe degenerative joint disease of the left knee(s). The overall alignment isneutral. The bone quality appears to be adequate for age and reported activity  level.      Assessment/Plan:  End stage arthritis, left knee   The patient history, physical examination, clinical judgment of the provider and imaging studies are consistent with end stage degenerative joint disease of the left knee(s) and total knee arthroplasty is deemed medically necessary. The treatment options including medical management, injection therapy arthroscopy and arthroplasty were discussed at length. The risks and benefits of total knee arthroplasty were presented and reviewed. The risks due to aseptic loosening, infection, stiffness, patella tracking problems, thromboembolic complications and other imponderables were discussed. The patient acknowledged the explanation, agreed to proceed with the plan and consent was signed. Patient is being admitted for inpatient treatment for surgery, pain control, PT, OT, prophylactic antibiotics, VTE prophylaxis, progressive ambulation and ADL's and discharge planning. The patient is planning  to be discharged  home.  Therapy Plans: outpatient therapy at [place] Disposition: Home with wife Planned DVT Prophylaxis: aspirin '81mg'$  BID DME needed: none PCP: Dr. Jerline Pain, clearance received TXA: IV Allergies: lisinopril - cough Anesthesia Concerns: none BMI: 30 Last HgbA1c: 6.8%   Other: - oxycodone, robaxin, tylenol, zofran  Patient's anticipated LOS is less than 2 midnights, meeting these requirements: - Younger than 10 - Lives within 1 hour of care - Has a competent adult at home to recover with post-op recover - NO history of  - Chronic pain requiring opiods  - Diabetes  - Coronary Artery Disease  - Heart failure  - Heart attack  - Stroke  - DVT/VTE  - Cardiac arrhythmia  - Respiratory Failure/COPD  - Renal failure  - Anemia  - Advanced Liver disease  Costella Hatcher, PA-C Orthopedic Surgery [facility] [place] [phone]

## 2021-11-24 NOTE — Anesthesia Preprocedure Evaluation (Signed)
Anesthesia Evaluation  Patient identified by MRN, date of birth, ID band Patient awake    Reviewed: Allergy & Precautions, NPO status , Patient's Chart, lab work & pertinent test results  Airway Mallampati: II  TM Distance: >3 FB Neck ROM: Full    Dental no notable dental hx. (+) Teeth Intact, Dental Advisory Given   Pulmonary former smoker,    Pulmonary exam normal breath sounds clear to auscultation       Cardiovascular hypertension, + CAD  Normal cardiovascular exam+ dysrhythmias  Rhythm:Regular Rate:Normal  2017 TTE Left ventricle: The cavity size was normal. Wall thickness was  increased in a pattern of mild LVH. Systolic function was normal.  The estimated ejection fraction was in the range of 55% to 60%.  Septal bounce consistent with LBBB.   Neuro/Psych negative neurological ROS     GI/Hepatic GERD  Medicated and Controlled,  Endo/Other    Renal/GU      Musculoskeletal  (+) Arthritis ,   Abdominal   Peds  Hematology Lab Results      Component                Value               Date                           HGB                      14.1                11/14/2021                HCT                      42.4                11/14/2021                       PLT                      250                 11/14/2021              Anesthesia Other Findings   Reproductive/Obstetrics                            Anesthesia Physical Anesthesia Plan  ASA: 3  Anesthesia Plan: Spinal   Post-op Pain Management: Regional block*   Induction:   PONV Risk Score and Plan: 2 and Treatment may vary due to age or medical condition and Ondansetron  Airway Management Planned: Natural Airway and Nasal Cannula  Additional Equipment: None  Intra-op Plan:   Post-operative Plan:   Informed Consent: I have reviewed the patients History and Physical, chart, labs and discussed the procedure  including the risks, benefits and alternatives for the proposed anesthesia with the patient or authorized representative who has indicated his/her understanding and acceptance.     Dental advisory given  Plan Discussed with:   Anesthesia Plan Comments: (Sp w L Adductor)       Anesthesia Quick Evaluation

## 2021-11-25 ENCOUNTER — Ambulatory Visit (HOSPITAL_BASED_OUTPATIENT_CLINIC_OR_DEPARTMENT_OTHER): Payer: Medicare Other | Admitting: Anesthesiology

## 2021-11-25 ENCOUNTER — Encounter (HOSPITAL_COMMUNITY): Payer: Self-pay | Admitting: Orthopedic Surgery

## 2021-11-25 ENCOUNTER — Encounter (HOSPITAL_COMMUNITY)
Admission: RE | Disposition: A | Discharge status: Home / Self Care | Payer: Self-pay | Source: Home / Self Care | Attending: Orthopedic Surgery

## 2021-11-25 ENCOUNTER — Observation Stay (HOSPITAL_COMMUNITY)
Admission: RE | Admit: 2021-11-25 | Discharge: 2021-11-26 | Disposition: A | Discharge status: Home / Self Care | Payer: Medicare Other | Attending: Orthopedic Surgery | Admitting: Orthopedic Surgery

## 2021-11-25 ENCOUNTER — Ambulatory Visit (HOSPITAL_COMMUNITY): Payer: Medicare Other | Admitting: Anesthesiology

## 2021-11-25 ENCOUNTER — Other Ambulatory Visit: Payer: Self-pay

## 2021-11-25 DIAGNOSIS — M25762 Osteophyte, left knee: Secondary | ICD-10-CM

## 2021-11-25 DIAGNOSIS — M25462 Effusion, left knee: Secondary | ICD-10-CM | POA: Diagnosis not present

## 2021-11-25 DIAGNOSIS — I5042 Chronic combined systolic (congestive) and diastolic (congestive) heart failure: Secondary | ICD-10-CM | POA: Insufficient documentation

## 2021-11-25 DIAGNOSIS — Z01818 Encounter for other preprocedural examination: Secondary | ICD-10-CM

## 2021-11-25 DIAGNOSIS — Z87891 Personal history of nicotine dependence: Secondary | ICD-10-CM

## 2021-11-25 DIAGNOSIS — I2510 Atherosclerotic heart disease of native coronary artery without angina pectoris: Secondary | ICD-10-CM

## 2021-11-25 DIAGNOSIS — I10 Essential (primary) hypertension: Secondary | ICD-10-CM | POA: Diagnosis not present

## 2021-11-25 DIAGNOSIS — M1712 Unilateral primary osteoarthritis, left knee: Principal | ICD-10-CM | POA: Insufficient documentation

## 2021-11-25 DIAGNOSIS — G8918 Other acute postprocedural pain: Secondary | ICD-10-CM | POA: Diagnosis not present

## 2021-11-25 DIAGNOSIS — Z79899 Other long term (current) drug therapy: Secondary | ICD-10-CM | POA: Insufficient documentation

## 2021-11-25 DIAGNOSIS — I110 Hypertensive heart disease with heart failure: Secondary | ICD-10-CM | POA: Diagnosis not present

## 2021-11-25 DIAGNOSIS — Z96652 Presence of left artificial knee joint: Secondary | ICD-10-CM

## 2021-11-25 DIAGNOSIS — M659 Synovitis and tenosynovitis, unspecified: Secondary | ICD-10-CM | POA: Diagnosis not present

## 2021-11-25 HISTORY — PX: TOTAL KNEE ARTHROPLASTY: SHX125

## 2021-11-25 SURGERY — ARTHROPLASTY, KNEE, TOTAL
Anesthesia: Spinal | Site: Knee | Laterality: Left

## 2021-11-25 MED ORDER — FERROUS SULFATE 325 (65 FE) MG PO TABS
325.0000 mg | ORAL_TABLET | Freq: Three times a day (TID) | ORAL | Status: DC
  Administered 2021-11-25 – 2021-11-26 (×2): 325 mg via ORAL
  Filled 2021-11-25 (×2): qty 1

## 2021-11-25 MED ORDER — ASPIRIN 81 MG PO CHEW
81.0000 mg | CHEWABLE_TABLET | Freq: Two times a day (BID) | ORAL | Status: DC
  Administered 2021-11-25 – 2021-11-26 (×2): 81 mg via ORAL
  Filled 2021-11-25 (×2): qty 1

## 2021-11-25 MED ORDER — ONDANSETRON HCL 4 MG/2ML IJ SOLN: 4.0000 mg | Freq: Four times a day (QID) | INTRAMUSCULAR | Status: DC | PRN

## 2021-11-25 MED ORDER — CELECOXIB 200 MG PO CAPS
200.0000 mg | ORAL_CAPSULE | Freq: Two times a day (BID) | ORAL | Status: DC
  Administered 2021-11-25 – 2021-11-26 (×2): 200 mg via ORAL
  Filled 2021-11-25 (×2): qty 1

## 2021-11-25 MED ORDER — DIPHENHYDRAMINE HCL 12.5 MG/5ML PO ELIX: 12.5000 mg | ORAL_SOLUTION | ORAL | Status: DC | PRN

## 2021-11-25 MED ORDER — METOCLOPRAMIDE HCL 5 MG/ML IJ SOLN: 5.0000 mg | Freq: Three times a day (TID) | INTRAMUSCULAR | Status: DC | PRN

## 2021-11-25 MED ORDER — LACTATED RINGERS IV SOLN: INTRAVENOUS | Status: DC

## 2021-11-25 MED ORDER — ONDANSETRON HCL 4 MG/2ML IJ SOLN
INTRAMUSCULAR | Status: DC | PRN
  Administered 2021-11-25: 4 mg via INTRAVENOUS

## 2021-11-25 MED ORDER — TRANEXAMIC ACID-NACL 1000-0.7 MG/100ML-% IV SOLN
1000.0000 mg | Freq: Once | INTRAVENOUS | Status: AC
  Administered 2021-11-25: 1000 mg via INTRAVENOUS
  Filled 2021-11-25: qty 100

## 2021-11-25 MED ORDER — LIDOCAINE HCL (PF) 2 % IJ SOLN
INTRAMUSCULAR | Status: AC
  Filled 2021-11-25: qty 5

## 2021-11-25 MED ORDER — ONDANSETRON HCL 4 MG PO TABS: 4.0000 mg | ORAL_TABLET | Freq: Four times a day (QID) | ORAL | Status: DC | PRN

## 2021-11-25 MED ORDER — DEXAMETHASONE SODIUM PHOSPHATE 10 MG/ML IJ SOLN
10.0000 mg | Freq: Once | INTRAMUSCULAR | Status: AC
  Administered 2021-11-26: 10 mg via INTRAVENOUS
  Filled 2021-11-25: qty 1

## 2021-11-25 MED ORDER — ORAL CARE MOUTH RINSE: 15.0000 mL | Freq: Once | OROMUCOSAL | Status: AC

## 2021-11-25 MED ORDER — ONDANSETRON HCL 4 MG/2ML IJ SOLN
INTRAMUSCULAR | Status: AC
  Filled 2021-11-25: qty 2

## 2021-11-25 MED ORDER — SODIUM CHLORIDE 0.9 % IR SOLN
Status: DC | PRN
  Administered 2021-11-25: 1000 mL

## 2021-11-25 MED ORDER — FESOTERODINE FUMARATE ER 4 MG PO TB24
4.0000 mg | ORAL_TABLET | Freq: Every day | ORAL | Status: DC
  Administered 2021-11-26: 4 mg via ORAL
  Filled 2021-11-25: qty 1

## 2021-11-25 MED ORDER — BUPIVACAINE-EPINEPHRINE (PF) 0.25% -1:200000 IJ SOLN
INTRAMUSCULAR | Status: DC | PRN
  Administered 2021-11-25: 30 mL

## 2021-11-25 MED ORDER — ROSUVASTATIN CALCIUM 10 MG PO TABS
10.0000 mg | ORAL_TABLET | Freq: Every day | ORAL | Status: DC
  Administered 2021-11-26: 10 mg via ORAL
  Filled 2021-11-25: qty 1

## 2021-11-25 MED ORDER — SODIUM CHLORIDE 0.9 % IV SOLN: INTRAVENOUS | Status: DC

## 2021-11-25 MED ORDER — TRANEXAMIC ACID-NACL 1000-0.7 MG/100ML-% IV SOLN
1000.0000 mg | INTRAVENOUS | Status: AC
  Administered 2021-11-25: 1000 mg via INTRAVENOUS
  Filled 2021-11-25: qty 100

## 2021-11-25 MED ORDER — PROPOFOL 1000 MG/100ML IV EMUL
INTRAVENOUS | Status: AC
  Filled 2021-11-25: qty 100

## 2021-11-25 MED ORDER — POLYETHYLENE GLYCOL 3350 17 G PO PACK
17.0000 g | PACK | Freq: Every day | ORAL | Status: DC
  Administered 2021-11-25 – 2021-11-26 (×2): 17 g via ORAL
  Filled 2021-11-25 (×2): qty 1

## 2021-11-25 MED ORDER — EPHEDRINE 5 MG/ML INJ
INTRAVENOUS | Status: AC
  Filled 2021-11-25: qty 5

## 2021-11-25 MED ORDER — AMLODIPINE BESYLATE 5 MG PO TABS
5.0000 mg | ORAL_TABLET | Freq: Every day | ORAL | Status: DC
  Administered 2021-11-26: 5 mg via ORAL
  Filled 2021-11-25: qty 1

## 2021-11-25 MED ORDER — PHENOL 1.4 % MT LIQD: 1.0000 | OROMUCOSAL | Status: DC | PRN

## 2021-11-25 MED ORDER — HYDROMORPHONE HCL 1 MG/ML IJ SOLN: 0.5000 mg | INTRAMUSCULAR | Status: DC | PRN

## 2021-11-25 MED ORDER — CEFAZOLIN SODIUM-DEXTROSE 2-4 GM/100ML-% IV SOLN
2.0000 g | Freq: Four times a day (QID) | INTRAVENOUS | Status: AC
  Administered 2021-11-25 (×2): 2 g via INTRAVENOUS
  Filled 2021-11-25 (×2): qty 100

## 2021-11-25 MED ORDER — ACETAMINOPHEN 10 MG/ML IV SOLN: 1000.0000 mg | Freq: Once | INTRAVENOUS | Status: DC | PRN

## 2021-11-25 MED ORDER — METHOCARBAMOL 500 MG PO TABS
500.0000 mg | ORAL_TABLET | Freq: Four times a day (QID) | ORAL | Status: DC | PRN
  Administered 2021-11-25 – 2021-11-26 (×2): 500 mg via ORAL
  Filled 2021-11-25 (×2): qty 1

## 2021-11-25 MED ORDER — SODIUM CHLORIDE (PF) 0.9 % IJ SOLN
INTRAMUSCULAR | Status: DC | PRN
  Administered 2021-11-25: 30 mL

## 2021-11-25 MED ORDER — DEXAMETHASONE SODIUM PHOSPHATE 10 MG/ML IJ SOLN
INTRAMUSCULAR | Status: DC | PRN
  Administered 2021-11-25: 8 mg via INTRAVENOUS

## 2021-11-25 MED ORDER — SODIUM CHLORIDE (PF) 0.9 % IJ SOLN
INTRAMUSCULAR | Status: AC
  Filled 2021-11-25: qty 30

## 2021-11-25 MED ORDER — GABAPENTIN 300 MG PO CAPS
300.0000 mg | ORAL_CAPSULE | Freq: Two times a day (BID) | ORAL | Status: DC
  Administered 2021-11-25 – 2021-11-26 (×2): 300 mg via ORAL
  Filled 2021-11-25 (×2): qty 1

## 2021-11-25 MED ORDER — FENTANYL CITRATE PF 50 MCG/ML IJ SOSY: 25.0000 ug | PREFILLED_SYRINGE | INTRAMUSCULAR | Status: DC | PRN

## 2021-11-25 MED ORDER — FAMOTIDINE 20 MG PO TABS
20.0000 mg | ORAL_TABLET | Freq: Every day | ORAL | Status: DC
  Administered 2021-11-26: 20 mg via ORAL
  Filled 2021-11-25 (×2): qty 1

## 2021-11-25 MED ORDER — PHENYLEPHRINE HCL (PRESSORS) 10 MG/ML IV SOLN
INTRAVENOUS | Status: AC
  Filled 2021-11-25: qty 1

## 2021-11-25 MED ORDER — PHENYLEPHRINE 80 MCG/ML (10ML) SYRINGE FOR IV PUSH (FOR BLOOD PRESSURE SUPPORT)
PREFILLED_SYRINGE | INTRAVENOUS | Status: AC
  Filled 2021-11-25: qty 10

## 2021-11-25 MED ORDER — PHENYLEPHRINE HCL-NACL 20-0.9 MG/250ML-% IV SOLN
INTRAVENOUS | Status: DC | PRN
  Administered 2021-11-25: 25 ug/min via INTRAVENOUS

## 2021-11-25 MED ORDER — MENTHOL 3 MG MT LOZG: 1.0000 | LOZENGE | OROMUCOSAL | Status: DC | PRN

## 2021-11-25 MED ORDER — LIDOCAINE HCL (CARDIAC) PF 100 MG/5ML IV SOSY
PREFILLED_SYRINGE | INTRAVENOUS | Status: DC | PRN
  Administered 2021-11-25: 20 mg via INTRAVENOUS

## 2021-11-25 MED ORDER — PHENYLEPHRINE 80 MCG/ML (10ML) SYRINGE FOR IV PUSH (FOR BLOOD PRESSURE SUPPORT)
PREFILLED_SYRINGE | INTRAVENOUS | Status: DC | PRN
  Administered 2021-11-25: 80 ug via INTRAVENOUS
  Administered 2021-11-25 (×2): 160 ug via INTRAVENOUS
  Administered 2021-11-25: 80 ug via INTRAVENOUS
  Administered 2021-11-25: 160 ug via INTRAVENOUS
  Administered 2021-11-25: 80 ug via INTRAVENOUS

## 2021-11-25 MED ORDER — KETOROLAC TROMETHAMINE 30 MG/ML IJ SOLN
INTRAMUSCULAR | Status: AC
  Filled 2021-11-25: qty 1

## 2021-11-25 MED ORDER — METHOCARBAMOL 500 MG IVPB - SIMPLE MED: 500.0000 mg | Freq: Four times a day (QID) | INTRAVENOUS | Status: DC | PRN

## 2021-11-25 MED ORDER — KETOROLAC TROMETHAMINE 30 MG/ML IJ SOLN
INTRAMUSCULAR | Status: DC | PRN
  Administered 2021-11-25: 30 mg

## 2021-11-25 MED ORDER — FLUTICASONE PROPIONATE 50 MCG/ACT NA SUSP: 1.0000 | Freq: Every day | NASAL | Status: DC | PRN

## 2021-11-25 MED ORDER — BUPIVACAINE-EPINEPHRINE (PF) 0.25% -1:200000 IJ SOLN
INTRAMUSCULAR | Status: AC
  Filled 2021-11-25: qty 30

## 2021-11-25 MED ORDER — ACETAMINOPHEN 500 MG PO TABS
1000.0000 mg | ORAL_TABLET | Freq: Four times a day (QID) | ORAL | Status: DC
  Administered 2021-11-25 – 2021-11-26 (×3): 1000 mg via ORAL
  Filled 2021-11-25 (×3): qty 2

## 2021-11-25 MED ORDER — STERILE WATER FOR IRRIGATION IR SOLN
Status: DC | PRN
  Administered 2021-11-25: 2000 mL

## 2021-11-25 MED ORDER — OXYCODONE HCL 5 MG PO TABS
5.0000 mg | ORAL_TABLET | ORAL | Status: DC | PRN
  Administered 2021-11-25 (×2): 10 mg via ORAL
  Filled 2021-11-25 (×2): qty 2

## 2021-11-25 MED ORDER — POLYVINYL ALCOHOL 1.4 % OP SOLN
1.0000 [drp] | Freq: Every day | OPHTHALMIC | Status: DC
  Filled 2021-11-25: qty 15

## 2021-11-25 MED ORDER — LOSARTAN POTASSIUM 50 MG PO TABS
100.0000 mg | ORAL_TABLET | Freq: Every day | ORAL | Status: DC
  Administered 2021-11-26: 100 mg via ORAL
  Filled 2021-11-25: qty 2

## 2021-11-25 MED ORDER — PROPOFOL 500 MG/50ML IV EMUL
INTRAVENOUS | Status: DC | PRN
  Administered 2021-11-25: 80 ug/kg/min via INTRAVENOUS

## 2021-11-25 MED ORDER — PROPOFOL 10 MG/ML IV BOLUS
INTRAVENOUS | Status: AC
  Filled 2021-11-25: qty 20

## 2021-11-25 MED ORDER — DEXAMETHASONE SODIUM PHOSPHATE 10 MG/ML IJ SOLN
INTRAMUSCULAR | Status: AC
  Filled 2021-11-25: qty 1

## 2021-11-25 MED ORDER — LATANOPROST 0.005 % OP SOLN
1.0000 [drp] | Freq: Every day | OPHTHALMIC | Status: DC
  Administered 2021-11-25: 1 [drp] via OPHTHALMIC
  Filled 2021-11-25: qty 2.5

## 2021-11-25 MED ORDER — MEPIVACAINE HCL (PF) 2 % IJ SOLN
INTRAMUSCULAR | Status: DC | PRN
  Administered 2021-11-25: 3 mL via INTRATHECAL

## 2021-11-25 MED ORDER — OXYCODONE HCL 5 MG PO TABS: 10.0000 mg | ORAL_TABLET | ORAL | Status: DC | PRN

## 2021-11-25 MED ORDER — SENNA 8.6 MG PO TABS
2.0000 | ORAL_TABLET | Freq: Every day | ORAL | Status: DC
  Administered 2021-11-25: 17.2 mg via ORAL
  Filled 2021-11-25: qty 2

## 2021-11-25 MED ORDER — ONDANSETRON HCL 4 MG/2ML IJ SOLN: 4.0000 mg | Freq: Once | INTRAMUSCULAR | Status: DC | PRN

## 2021-11-25 MED ORDER — FENTANYL CITRATE PF 50 MCG/ML IJ SOSY
50.0000 ug | PREFILLED_SYRINGE | INTRAMUSCULAR | Status: AC
  Administered 2021-11-25: 50 ug via INTRAVENOUS
  Filled 2021-11-25: qty 2

## 2021-11-25 MED ORDER — BISACODYL 10 MG RE SUPP: 10.0000 mg | Freq: Every day | RECTAL | Status: DC | PRN

## 2021-11-25 MED ORDER — POVIDONE-IODINE 10 % EX SWAB
2.0000 | Freq: Once | CUTANEOUS | Status: AC
  Administered 2021-11-25: 2 via TOPICAL

## 2021-11-25 MED ORDER — DEXAMETHASONE SODIUM PHOSPHATE 10 MG/ML IJ SOLN: 8.0000 mg | Freq: Once | INTRAMUSCULAR | Status: DC

## 2021-11-25 MED ORDER — CEFAZOLIN SODIUM-DEXTROSE 2-4 GM/100ML-% IV SOLN
2.0000 g | INTRAVENOUS | Status: AC
  Administered 2021-11-25: 2 g via INTRAVENOUS
  Filled 2021-11-25: qty 100

## 2021-11-25 MED ORDER — EPHEDRINE SULFATE-NACL 50-0.9 MG/10ML-% IV SOSY
PREFILLED_SYRINGE | INTRAVENOUS | Status: DC | PRN
  Administered 2021-11-25 (×3): 5 mg via INTRAVENOUS

## 2021-11-25 MED ORDER — METOCLOPRAMIDE HCL 5 MG PO TABS: 5.0000 mg | ORAL_TABLET | Freq: Three times a day (TID) | ORAL | Status: DC | PRN

## 2021-11-25 MED ORDER — CHLORHEXIDINE GLUCONATE 0.12 % MT SOLN
15.0000 mL | Freq: Once | OROMUCOSAL | Status: AC
  Administered 2021-11-25: 15 mL via OROMUCOSAL

## 2021-11-25 MED ORDER — PROPOFOL 10 MG/ML IV BOLUS
INTRAVENOUS | Status: DC | PRN
  Administered 2021-11-25 (×3): 10 mg via INTRAVENOUS

## 2021-11-25 MED ORDER — 0.9 % SODIUM CHLORIDE (POUR BTL) OPTIME
TOPICAL | Status: DC | PRN
  Administered 2021-11-25: 1000 mL

## 2021-11-25 MED ORDER — CARBOXYMETHYLCELLULOSE SODIUM 0.5 % OP SOLN: 1.0000 [drp] | Freq: Every day | OPHTHALMIC | Status: DC

## 2021-11-25 SURGICAL SUPPLY — 49 items
ATTUNE MED ANAT PAT 38 KNEE (Knees) IMPLANT
ATTUNE PS FEM LT SZ 5 CEM KNEE (Femur) IMPLANT
ATTUNE PSRP INSE SZ5 7 KNEE (Insert) IMPLANT
BAG COUNTER SPONGE SURGICOUNT (BAG) IMPLANT
BAG ZIPLOCK 12X15 (MISCELLANEOUS) IMPLANT
BASE TIBIA ATTUNE KNEE SYS SZ6 (Knees) IMPLANT
BLADE SAW SGTL 11.0X1.19X90.0M (BLADE) IMPLANT
BLADE SAW SGTL 13.0X1.19X90.0M (BLADE) ×1 IMPLANT
BNDG ELASTIC 6X5.8 VLCR STR LF (GAUZE/BANDAGES/DRESSINGS) ×1 IMPLANT
BOWL SMART MIX CTS (DISPOSABLE) ×1 IMPLANT
CEMENT HV SMART SET (Cement) IMPLANT
CUFF TOURN SGL QUICK 34 (TOURNIQUET CUFF) ×1
CUFF TRNQT CYL 34X4.125X (TOURNIQUET CUFF) ×1 IMPLANT
DERMABOND ADVANCED .7 DNX12 (GAUZE/BANDAGES/DRESSINGS) ×1 IMPLANT
DRAPE U-SHAPE 47X51 STRL (DRAPES) ×1 IMPLANT
DRESSING AQUACEL AG SP 3.5X10 (GAUZE/BANDAGES/DRESSINGS) ×1 IMPLANT
DRSG AQUACEL AG SP 3.5X10 (GAUZE/BANDAGES/DRESSINGS) ×1
DURAPREP 26ML APPLICATOR (WOUND CARE) ×2 IMPLANT
ELECT REM PT RETURN 15FT ADLT (MISCELLANEOUS) ×1 IMPLANT
GLOVE BIO SURGEON STRL SZ 6 (GLOVE) ×1 IMPLANT
GLOVE BIOGEL PI IND STRL 6.5 (GLOVE) ×1 IMPLANT
GLOVE BIOGEL PI IND STRL 7.5 (GLOVE) ×1 IMPLANT
GLOVE ORTHO TXT STRL SZ7.5 (GLOVE) ×2 IMPLANT
GOWN STRL REUS W/ TWL LRG LVL3 (GOWN DISPOSABLE) ×2 IMPLANT
GOWN STRL REUS W/TWL LRG LVL3 (GOWN DISPOSABLE) ×2
HANDPIECE INTERPULSE COAX TIP (DISPOSABLE) ×1
HOLDER FOLEY CATH W/STRAP (MISCELLANEOUS) IMPLANT
KIT TURNOVER KIT A (KITS) IMPLANT
MANIFOLD NEPTUNE II (INSTRUMENTS) ×1 IMPLANT
NDL SAFETY ECLIP 18X1.5 (MISCELLANEOUS) IMPLANT
NS IRRIG 1000ML POUR BTL (IV SOLUTION) ×1 IMPLANT
PACK TOTAL KNEE CUSTOM (KITS) ×1 IMPLANT
PIN FIX SIGMA LCS THRD HI (PIN) IMPLANT
PROTECTOR NERVE ULNAR (MISCELLANEOUS) ×1 IMPLANT
SET HNDPC FAN SPRY TIP SCT (DISPOSABLE) ×1 IMPLANT
SET PAD KNEE POSITIONER (MISCELLANEOUS) ×1 IMPLANT
SPIKE FLUID TRANSFER (MISCELLANEOUS) ×2 IMPLANT
SUT MNCRL AB 4-0 PS2 18 (SUTURE) ×1 IMPLANT
SUT STRATAFIX PDS+ 0 24IN (SUTURE) ×1 IMPLANT
SUT VIC AB 1 CT1 36 (SUTURE) ×1 IMPLANT
SUT VIC AB 2-0 CT1 27 (SUTURE) ×2
SUT VIC AB 2-0 CT1 TAPERPNT 27 (SUTURE) ×2 IMPLANT
SYR 3ML LL SCALE MARK (SYRINGE) ×1 IMPLANT
TIBIA ATTUNE KNEE SYS BASE SZ6 (Knees) ×1 IMPLANT
TOWEL GREEN STERILE FF (TOWEL DISPOSABLE) ×1 IMPLANT
TRAY FOLEY MTR SLVR 16FR STAT (SET/KITS/TRAYS/PACK) ×1 IMPLANT
TUBE SUCTION HIGH CAP CLEAR NV (SUCTIONS) ×1 IMPLANT
WATER STERILE IRR 1000ML POUR (IV SOLUTION) ×2 IMPLANT
WRAP KNEE MAXI GEL POST OP (GAUZE/BANDAGES/DRESSINGS) ×1 IMPLANT

## 2021-11-25 NOTE — Op Note (Signed)
NAME:  Thomas Li                      MEDICAL RECORD NO.:  [id number]                             FACILITY:  [facility]      PHYSICIAN:  Pietro Cassis. Alvan Dame, M.D.  DATE OF BIRTH:  1940-07-29      DATE OF PROCEDURE:  11/25/2021                                     OPERATIVE REPORT         PREOPERATIVE DIAGNOSIS:  Left knee osteoarthritis.      POSTOPERATIVE DIAGNOSIS:  Left knee osteoarthritis.      FINDINGS:  The patient was noted to have complete loss of cartilage and   bone-on-bone arthritis with associated osteophytes in the medial and patellofemoral compartments of   the knee with a significant synovitis and associated effusion.  The patient had failed months of conservative treatment including medications, injection therapy, activity modification.     PROCEDURE:  Left total knee replacement.      COMPONENTS USED:  DePuy Attune rotating platform posterior stabilized knee   system, a size 5 femur, 5 tibia, size 7 mm PS AOX insert, and 38 anatomic patellar   button.      SURGEON:  Pietro Cassis. Alvan Dame, M.D.      ASSISTANT:  Costella Hatcher, PA-C.      ANESTHESIA:  Spinal.      SPECIMENS:  None.      COMPLICATION:  None.      DRAINS:  None.  EBL: <100 cc      TOURNIQUET TIME:  28 min at 225 mmhg     The patient was stable to the recovery room.      INDICATION FOR PROCEDURE:  Thomas Li is a 80 y.o. male patient of   mine.  The patient had been seen, evaluated, and treated for months conservatively in the   office with medication, activity modification, and injections.  The patient had   radiographic changes of bone-on-bone arthritis with endplate sclerosis and osteophytes noted.  Based on the radiographic changes and failed conservative measures, the patient   decided to proceed with definitive treatment, total knee replacement.  Risks of infection, DVT, component failure, need for revision surgery, neurovascular injury were reviewed in the office setting.  The postop  course was reviewed stressing the efforts to maximize post-operative satisfaction and function.  Consent was obtained for benefit of pain   relief.      PROCEDURE IN DETAIL:  The patient was brought to the operative theater.   Once adequate anesthesia, preoperative antibiotics, 2 gm of Ancef,1 gm of Tranexamic Acid, and 10 mg of Decadron administered, the patient was positioned supine with a left thigh tourniquet placed.  The  left lower extremity was prepped and draped in sterile fashion.  A time-   out was performed identifying the patient, planned procedure, and the appropriate extremity.      The left lower extremity was placed in the [facility] leg holder.  The leg was   exsanguinated, tourniquet elevated to 250 mmHg.  A midline incision was   made followed by median parapatellar arthrotomy.  Following initial   exposure, attention was first directed  to the patella.  Precut   measurement was noted to be 24 mm.  I resected down to 13 mm and used a   38 anatomic patellar button to restore patellar height as well as cover the cut surface.      The lug holes were drilled and a metal shim was placed to protect the   patella from retractors and saw blade during the procedure.      At this point, attention was now directed to the femur.  The femoral   canal was opened with a drill, irrigated to try to prevent fat emboli.  An   intramedullary rod was passed at 5 degrees valgus, 9 mm of bone was   resected off the distal femur.  Following this resection, the tibia was   subluxated anteriorly.  Using the extramedullary guide, 2 mm of bone was resected off   the proximal medial tibia.  We confirmed the gap would be   stable medially and laterally with a size 5 spacer block as well as confirmed that the tibial cut was perpendicular in the coronal plane, checking with an alignment rod.      Once this was done, I sized the femur to be a size 5 in the anterior-   posterior dimension, chose a standard  component based on medial and   lateral dimension.  The size 5 rotation block was then pinned in   position anterior referenced using the C-clamp to set rotation.  The   anterior, posterior, and  chamfer cuts were made without difficulty nor   notching making certain that I was along the anterior cortex to help   with flexion gap stability.      The final box cut was made off the lateral aspect of distal femur.      At this point, the tibia was sized to be a size 5.  The size 5 tray was   then pinned in position through the medial third of the tubercle,   drilled, and keel punched.  Trial reduction was now carried with a 5 femur,  5 tibia, a size 7 mm PS insert, and the 38 anatomic patella botton.  The knee was brought to full extension with good flexion stability with the patella   tracking through the trochlea without application of pressure.  Given   all these findings the trial components removed.  Final components were   opened and cement was mixed.  The knee was irrigated with normal saline solution and pulse lavage.  The synovial lining was   then injected with 30 cc of 0.25% Marcaine with epinephrine, 1 cc of Toradol and 30 cc of NS for a total of 61 cc.     Final implants were then cemented onto cleaned and dried cut surfaces of bone with the knee brought to extension with a size 7 mm PS trial insert.      Once the cement had fully cured, excess cement was removed   throughout the knee.  I confirmed that I was satisfied with the range of   motion and stability, and the final size 7 mm PS AOX insert was chosen.  It was   placed into the knee.      The tourniquet had been let down at 28 minutes.  No significant   hemostasis was required.  The extensor mechanism was then reapproximated using #1 Vicryl and #1 Stratafix sutures with the knee   in flexion.  The   remaining  wound was closed with 2-0 Vicryl and running 4-0 Monocryl.   The knee was cleaned, dried, dressed sterilely using  Dermabond and   Aquacel dressing.  The patient was then   brought to recovery room in stable condition, tolerating the procedure   well.   Please note that Physician Assistant, Costella Hatcher, PA-C was present for the entirety of the case, and was utilized for pre-operative positioning, peri-operative retractor management, general facilitation of the procedure and for primary wound closure at the end of the case.              Pietro Cassis Alvan Dame, M.D.    11/25/2021 8:59 AM

## 2021-11-25 NOTE — Care Plan (Signed)
Ortho Bundle Case Management Note  Patient Details  Name: EUCLID CASSETTA MRN: [id number] Date of Birth: [birth year]                  L TKA on 10/31. DCP: Home with wife. DME: No needs. Has RW. PT: Quinn Plowman 11/3   DME Arranged:  N/A DME Agency:     HH Arranged:    [place] Agency:     Additional Comments: Please contact me with any questions of if this plan should need to change.  Marianne Sofia, RN,CCM [facility]  [phone] 11/25/2021, 2:42 PM

## 2021-11-25 NOTE — Anesthesia Postprocedure Evaluation (Signed)
Anesthesia Post Note  Patient: Thomas Li  Procedure(s) Performed: TOTAL KNEE ARTHROPLASTY (Left: Knee)     Patient location during evaluation: Nursing Unit Anesthesia Type: Spinal Level of consciousness: oriented and awake and alert Pain management: pain level controlled Vital Signs Assessment: post-procedure vital signs reviewed and stable Respiratory status: spontaneous breathing and respiratory function stable Cardiovascular status: blood pressure returned to baseline and stable Postop Assessment: no headache, no backache, no apparent nausea or vomiting and patient able to bend at knees Anesthetic complications: no   No notable events documented.  Last Vitals:  Vitals:   11/25/21 1300 11/25/21 1403  BP: 98/67 133/73  Pulse: 73 83  Resp: 17 18  Temp:  36.6 C  SpO2: 94% 96%    Last Pain:  Vitals:   11/25/21 1502  TempSrc:   PainSc: 4                  [name]

## 2021-11-25 NOTE — Transfer of Care (Signed)
Immediate Anesthesia Transfer of Care Note  Patient: Thomas Li  Procedure(s) Performed: TOTAL KNEE ARTHROPLASTY (Left: Knee)  Patient Location: PACU  Anesthesia Type:Spinal  Level of Consciousness: awake, drowsy and patient cooperative  Airway & Oxygen Therapy: Patient Spontanous Breathing and Patient connected to face mask oxygen  Post-op Assessment: Report given to RN and Post -op Vital signs reviewed and stable  Post vital signs: Reviewed and stable  Last Vitals:  Vitals Value Taken Time  BP 91/54 11/25/21 1136  Temp    Pulse 81 11/25/21 1138  Resp 12 11/25/21 1138  SpO2 98 % 11/25/21 1138  Vitals shown include unvalidated device data.  Last Pain:  Vitals:   11/25/21 0810  TempSrc: Oral  PainSc:       Patients Stated Pain Goal: 5 (18/84/16 6063)  Complications: No notable events documented.

## 2021-11-25 NOTE — Plan of Care (Signed)
  Problem: Activity: Goal: Ability to avoid complications of mobility impairment will improve Outcome: Progressing Goal: Range of joint motion will improve Outcome: Progressing   Problem: Pain Management: Goal: Pain level will decrease with appropriate interventions Outcome: Progressing   Problem: Safety: Goal: Ability to remain free from injury will improve Outcome: Progressing

## 2021-11-25 NOTE — Evaluation (Signed)
Physical Therapy Evaluation Patient Details Name: Thomas Li MRN: [id number] DOB: January 09, 1941 Today's Date: 11/25/2021  History of Present Illness  Pt is an 81yo male presenting s/p L-TKA on 11/25/21. PMH: CHF, HTN, plantar fasciitis, L-ACL repair, neuropathy, GERD, prediabetes, LBBB, BPH.   Clinical Impression  Thomas Li is a 81 y.o. male POD 0 s/p L-TKA. Patient reports independence with mobility at baseline. Patient is now limited by functional impairments (see PT problem list below) and requires supervision for bed mobility and min guard for transfers. Patient was able to ambulate 24 feet with RW and min guard level of assist. Patient instructed in exercise to facilitate ROM and circulation to manage edema. Provided incentive spirometer and with Vcs pt able to achieve 2060m. Patient will benefit from continued skilled PT interventions to address impairments and progress towards PLOF. Acute PT will follow to progress mobility and stair training in preparation for safe discharge home.       Recommendations for follow up therapy are one component of a multi-disciplinary discharge planning process, led by the attending physician.  Recommendations may be updated based on patient status, additional functional criteria and insurance authorization.  Follow Up Recommendations Follow physician's recommendations for discharge plan and follow up therapies      Assistance Recommended at Discharge Frequent or constant Supervision/Assistance  Patient can return home with the following  A little help with walking and/or transfers;A little help with bathing/dressing/bathroom;Assistance with cooking/housework;Assist for transportation;Help with stairs or ramp for entrance    Equipment Recommendations None recommended by PT  Recommendations for Other Services       Functional Status Assessment Patient has had a recent decline in their functional status and demonstrates the ability to make  significant improvements in function in a reasonable and predictable amount of time.     Precautions / Restrictions Precautions Precautions: Fall;Knee Precaution Booklet Issued: No Precaution Comments: no pillow under the knee Restrictions Weight Bearing Restrictions: No Other Position/Activity Restrictions: wbat      Mobility  Bed Mobility Overal bed mobility: Needs Assistance Bed Mobility: Supine to Sit     Supine to sit: Supervision, HOB elevated     General bed mobility comments: For safety only, no physical assist required    Transfers Overall transfer level: Needs assistance Equipment used: Rolling walker (2 wheels) Transfers: Sit to/from Stand Sit to Stand: Min guard           General transfer comment: Min guard for safety, no physical assist required, VCs for powering up from bed.    Ambulation/Gait Ambulation/Gait assistance: Min guard, +2 safety/equipment Gait Distance (Feet): 24 Feet Assistive device: Rolling walker (2 wheels) Gait Pattern/deviations: Step-to pattern Gait velocity: decreased     General Gait Details: pt ambulated with RW and min guard, no physical assist required or overt LOB noted +2 for recliner follow for safety, mildly antalgic with some reluctance to WB on LLE but this improved over distance.  Stairs            Wheelchair Mobility    Modified Rankin (Stroke Patients Only)       Balance Overall balance assessment: Needs assistance Sitting-balance support: Feet supported, No upper extremity supported Sitting balance-Leahy Scale: Good     Standing balance support: Reliant on assistive device for balance, During functional activity, Bilateral upper extremity supported Standing balance-Leahy Scale: Poor  Pertinent Vitals/Pain Pain Assessment Pain Assessment: 0-10 Pain Score: 4  Pain Location: left knee Pain Descriptors / Indicators: Operative site guarding, Burning Pain  Intervention(s): Limited activity within patient's tolerance, Monitored during session, Repositioned, Ice applied    Home Living Family/patient expects to be discharged to:: Private residence Living Arrangements: Spouse/significant other Available Help at Discharge: Family;Available 24 hours/day Type of Home: House Home Access: Stairs to enter Entrance Stairs-Rails: Right (Garage steps 5 railing bilaterally) Entrance Stairs-Number of Steps: 1 then 1   Home Layout: One level Home Equipment: Grab bars - tub/shower;Rolling Walker (2 wheels);Cane - single point;BSC/3in1;Shower seat      Prior Function Prior Level of Function : Independent/Modified Independent             Mobility Comments: IND ADLs Comments: IND     Hand Dominance        Extremity/Trunk Assessment   Upper Extremity Assessment Upper Extremity Assessment: Overall WFL for tasks assessed    Lower Extremity Assessment Lower Extremity Assessment: RLE deficits/detail;LLE deficits/detail RLE Deficits / Details: MMT ank DF/PF 5/5 RLE Sensation: WNL LLE Deficits / Details: MMT ank DF/PF 5/5, no extensor lag noted LLE Sensation: WNL    Cervical / Trunk Assessment Cervical / Trunk Assessment: Normal  Communication   Communication: No difficulties  Cognition Arousal/Alertness: Awake/alert Behavior During Therapy: WFL for tasks assessed/performed Overall Cognitive Status: Within Functional Limits for tasks assessed                                          General Comments General comments (skin integrity, edema, etc.): Wife Romie Minus present    Exercises Total Joint Exercises Ankle Circles/Pumps: AROM, Both, 10 reps   Assessment/Plan    PT Assessment Patient needs continued PT services  PT Problem List Decreased strength;Decreased range of motion;Decreased activity tolerance;Decreased balance;Decreased mobility;Decreased coordination;Pain       PT Treatment Interventions DME  instruction;Gait training;Stair training;Functional mobility training;Therapeutic activities;Therapeutic exercise;Balance training;Neuromuscular re-education;Patient/family education    PT Goals (Current goals can be found in the Care Plan section)  Acute Rehab PT Goals Patient Stated Goal: To go back to work PT Goal Formulation: With patient Time For Goal Achievement: 12/02/21 Potential to Achieve Goals: Good    Frequency 7X/week     Co-evaluation               AM-PAC PT "6 Clicks" Mobility  Outcome Measure Help needed turning from your back to your side while in a flat bed without using bedrails?: None Help needed moving from lying on your back to sitting on the side of a flat bed without using bedrails?: A Little Help needed moving to and from a bed to a chair (including a wheelchair)?: A Little Help needed standing up from a chair using your arms (e.g., wheelchair or bedside chair)?: A Little Help needed to walk in hospital room?: A Little Help needed climbing 3-5 steps with a railing? : A Little 6 Click Score: 19    End of Session Equipment Utilized During Treatment: Gait belt Activity Tolerance: Patient tolerated treatment well;No increased pain Patient left: in chair;with call bell/phone within reach;with chair alarm set;with family/visitor present;with SCD's reapplied Nurse Communication: Mobility status PT Visit Diagnosis: Pain;Difficulty in walking, not elsewhere classified (R26.2) Pain - Right/Left: Left Pain - part of body: Knee    Time: 7741-2878 PT Time Calculation (min) (ACUTE ONLY): 20 min  Charges:   PT Evaluation $PT Eval Low Complexity: [place], [place], DPT [facility] Office: [phone] Weekend pager: [phone]  Coolidge Breeze 11/25/2021, 4:26 PM

## 2021-11-25 NOTE — Anesthesia Procedure Notes (Signed)
Spinal  Patient location during procedure: OR Start time: 11/25/2021 9:58 AM End time: 11/25/2021 10:02 AM Reason for block: surgical anesthesia Staffing Performed: resident/CRNA  Anesthesiologist: [name], MD Resident/CRNA: Raenette Rover, CRNA Performed by: Raenette Rover, CRNA Authorized by: [name], MD   Preanesthetic Checklist Completed: patient identified, IV checked, site marked, risks and benefits discussed, surgical consent, monitors and equipment checked, pre-op evaluation and timeout performed Spinal Block Patient position: sitting Prep: DuraPrep Patient monitoring: blood pressure, continuous pulse ox and heart rate Approach: midline Location: L3-4 Injection technique: single-shot Needle Needle type: Pencan  Needle gauge: 24 G Assessment Sensory level: T6 Events: CSF return

## 2021-11-25 NOTE — Interval H&P Note (Signed)
History and Physical Interval Note:  11/25/2021 8:54 AM  Thomas Li  has presented today for surgery, with the diagnosis of Left knee osteoarthritis.  The various methods of treatment have been discussed with the patient and family. After consideration of risks, benefits and other options for treatment, the patient has consented to  Procedure(s): TOTAL KNEE ARTHROPLASTY (Left) as a surgical intervention.  The patient's history has been reviewed, patient examined, no change in status, stable for surgery.  I have reviewed the patient's chart and labs.  Questions were answered to the patient's satisfaction.     Mauri Pole

## 2021-11-25 NOTE — Discharge Instructions (Signed)

## 2021-11-26 ENCOUNTER — Encounter (HOSPITAL_COMMUNITY): Payer: Self-pay | Admitting: Orthopedic Surgery

## 2021-11-26 DIAGNOSIS — I5042 Chronic combined systolic (congestive) and diastolic (congestive) heart failure: Secondary | ICD-10-CM | POA: Diagnosis not present

## 2021-11-26 DIAGNOSIS — M1712 Unilateral primary osteoarthritis, left knee: Secondary | ICD-10-CM | POA: Diagnosis not present

## 2021-11-26 DIAGNOSIS — Z87891 Personal history of nicotine dependence: Secondary | ICD-10-CM | POA: Diagnosis not present

## 2021-11-26 DIAGNOSIS — Z79899 Other long term (current) drug therapy: Secondary | ICD-10-CM | POA: Diagnosis not present

## 2021-11-26 DIAGNOSIS — I110 Hypertensive heart disease with heart failure: Secondary | ICD-10-CM | POA: Diagnosis not present

## 2021-11-26 LAB — BASIC METABOLIC PANEL
Anion gap: 4 — ABNORMAL LOW (ref 5–15)
BUN: 12 mg/dL (ref 8–23)
CO2: 25 mmol/L (ref 22–32)
Calcium: 8.3 mg/dL — ABNORMAL LOW (ref 8.9–10.3)
Chloride: 107 mmol/L (ref 98–111)
Creatinine, Ser: 0.86 mg/dL (ref 0.61–1.24)
GFR, Estimated: >60 mL/min (ref >60)
Glucose, Bld: 180 mg/dL — ABNORMAL HIGH (ref 70–99)
Potassium: 4.9 mmol/L (ref 3.5–5.1)
Sodium: 136 mmol/L (ref 135–145)

## 2021-11-26 LAB — CBC
HCT: 36.4 % — ABNORMAL LOW (ref 39.0–52.0)
Hemoglobin: 12.1 g/dL — ABNORMAL LOW (ref 13.0–17.0)
MCH: 35.9 pg — ABNORMAL HIGH (ref 26.0–34.0)
MCHC: 33.2 g/dL (ref 30.0–36.0)
MCV: 108 fL — ABNORMAL HIGH (ref 80.0–100.0)
Platelets: 221 10*3/uL (ref 150–400)
RBC: 3.37 MIL/uL — ABNORMAL LOW (ref 4.22–5.81)
RDW: 12.5 % (ref 11.5–15.5)
WBC: 13.8 10*3/uL — ABNORMAL HIGH (ref 4.0–10.5)
nRBC: 0 % (ref 0.0–0.2)

## 2021-11-26 MED ORDER — CLONIDINE HCL (ANALGESIA) 100 MCG/ML EP SOLN
EPIDURAL | Status: DC | PRN
  Administered 2021-11-25: 100 ug

## 2021-11-26 MED ORDER — POLYETHYLENE GLYCOL 3350 17 G PO PACK: 17.0000 g | PACK | Freq: Every day | ORAL | 0 refills | Status: DC

## 2021-11-26 MED ORDER — ONDANSETRON HCL 4 MG PO TABS: 4.0000 mg | ORAL_TABLET | Freq: Four times a day (QID) | ORAL | 0 refills | Status: DC | PRN

## 2021-11-26 MED ORDER — CELECOXIB 200 MG PO CAPS: 200.0000 mg | ORAL_CAPSULE | Freq: Two times a day (BID) | ORAL | 0 refills | Status: DC

## 2021-11-26 MED ORDER — ASPIRIN 81 MG PO CHEW: 81.0000 mg | CHEWABLE_TABLET | Freq: Two times a day (BID) | ORAL | 0 refills | Status: AC

## 2021-11-26 MED ORDER — ACETAMINOPHEN 500 MG PO TABS: 1000.0000 mg | ORAL_TABLET | Freq: Four times a day (QID) | ORAL | 0 refills | Status: DC

## 2021-11-26 MED ORDER — ROPIVACAINE HCL 5 MG/ML IJ SOLN
INTRAMUSCULAR | Status: DC | PRN
  Administered 2021-11-25: 30 mL via EPIDURAL

## 2021-11-26 MED ORDER — OXYCODONE HCL 5 MG PO TABS: 5.0000 mg | ORAL_TABLET | ORAL | 0 refills | Status: DC | PRN

## 2021-11-26 MED ORDER — METHOCARBAMOL 500 MG PO TABS: 500.0000 mg | ORAL_TABLET | Freq: Four times a day (QID) | ORAL | 2 refills | Status: DC | PRN

## 2021-11-26 MED ORDER — SENNA 8.6 MG PO TABS: 2.0000 | ORAL_TABLET | Freq: Every day | ORAL | 0 refills | Status: AC

## 2021-11-26 NOTE — Progress Notes (Signed)
Physical Therapy Treatment Patient Details Name: Thomas Li MRN: [id number] DOB: September 11, 1940 Today's Date: 11/26/2021   History of Present Illness Pt is an 81yo male presenting s/p L-TKA on 11/25/21. PMH: CHF, HTN, plantar fasciitis, L-ACL repair, neuropathy, GERD, prediabetes, LBBB, BPH.    PT Comments    Pt seen POD1 received supine in bed motivated to participate with therapy. Demonstrated supevision for bed mobility, min guard for transfers, and min guard for ambulation in hallway with RW [id number]. Pt completed stair training with min guard, handout provided. Provided HEP and pt completed exercises with safe form and minimal cuing. All education completed and pt has no further questions. Pt has met mobility goals for safe discharge home, PT is signing off, should needs change please reconsult. Thank you for this referral.    Recommendations for follow up therapy are one component of a multi-disciplinary discharge planning process, led by the attending physician.  Recommendations may be updated based on patient status, additional functional criteria and insurance authorization.  Follow Up Recommendations  Follow physician's recommendations for discharge plan and follow up therapies     Assistance Recommended at Discharge Frequent or constant Supervision/Assistance  Patient can return home with the following A little help with walking and/or transfers;A little help with bathing/dressing/bathroom;Assistance with cooking/housework;Assist for transportation;Help with stairs or ramp for entrance   Equipment Recommendations  None recommended by PT    Recommendations for Other Services       Precautions / Restrictions Precautions Precautions: Fall;Knee Precaution Booklet Issued: No Precaution Comments: no pillow under the knee Restrictions Weight Bearing Restrictions: No Other Position/Activity Restrictions: wbat     Mobility  Bed Mobility Overal bed mobility: Needs  Assistance Bed Mobility: Supine to Sit     Supine to sit: Supervision, HOB elevated     General bed mobility comments: For safety only, no physical assist required    Transfers Overall transfer level: Needs assistance Equipment used: Rolling walker (2 wheels) Transfers: Sit to/from Stand Sit to Stand: Min guard           General transfer comment: Min guard for safety, no physical assist required, VCs for powering up from bed.    Ambulation/Gait Ambulation/Gait assistance: Min guard Gait Distance (Feet): 200 Feet Assistive device: Rolling walker (2 wheels) Gait Pattern/deviations: Step-to pattern Gait velocity: decreased     General Gait Details: pt ambulated with RW and min guard, no physical assist required or overt LOB, VCs for sequencing and proximity to device.   Stairs Stairs: Yes Stairs assistance: Min guard Stair Management: One rail Right, Step to pattern, Forwards, With cane Number of Stairs: 2 General stair comments: Pt completed stair training with min guard, no physical assist require or overt LOB noted, handout provided. VCs for sequencing.   Wheelchair Mobility    Modified Rankin (Stroke Patients Only)       Balance Overall balance assessment: Needs assistance Sitting-balance support: Feet supported, No upper extremity supported Sitting balance-Leahy Scale: Good     Standing balance support: Reliant on assistive device for balance, During functional activity, Bilateral upper extremity supported Standing balance-Leahy Scale: Poor                              Cognition Arousal/Alertness: Awake/alert Behavior During Therapy: WFL for tasks assessed/performed Overall Cognitive Status: Within Functional Limits for tasks assessed  Exercises Total Joint Exercises Ankle Circles/Pumps: AROM, Both, 10 reps Quad Sets: AROM, Left, 10 reps Short Arc Quad: AROM, Left, 10  reps Heel Slides: AROM, Left, 10 reps Hip ABduction/ADduction: AROM, Left, 10 reps Straight Leg Raises: AROM, Left, 10 reps Goniometric ROM: 0-60deg by gross visual approximation    General Comments        Pertinent Vitals/Pain Pain Assessment Pain Assessment: 0-10 Pain Score: 5  Pain Location: left knee Pain Descriptors / Indicators: Operative site guarding, Burning Pain Intervention(s): Limited activity within patient's tolerance, Monitored during session, Repositioned, Ice applied    Home Living                          Prior Function            PT Goals (current goals can now be found in the care plan section) Acute Rehab PT Goals Patient Stated Goal: To go back to work PT Goal Formulation: With patient Time For Goal Achievement: 12/02/21 Potential to Achieve Goals: Good Progress towards PT goals: Goals met/education completed, patient discharged from PT    Frequency    7X/week      PT Plan Current plan remains appropriate    Co-evaluation              AM-PAC PT "6 Clicks" Mobility   Outcome Measure  Help needed turning from your back to your side while in a flat bed without using bedrails?: None Help needed moving from lying on your back to sitting on the side of a flat bed without using bedrails?: A Little Help needed moving to and from a bed to a chair (including a wheelchair)?: A Little Help needed standing up from a chair using your arms (e.g., wheelchair or bedside chair)?: A Little Help needed to walk in hospital room?: A Little Help needed climbing 3-5 steps with a railing? : A Little 6 Click Score: 19    End of Session Equipment Utilized During Treatment: Gait belt Activity Tolerance: Patient tolerated treatment well;No increased pain Patient left: in chair;with call bell/phone within reach;with chair alarm set;with family/visitor present Nurse Communication: Mobility status PT Visit Diagnosis: Pain;Difficulty in walking, not  elsewhere classified (R26.2) Pain - Right/Left: Left Pain - part of body: Knee     Time: 0922-1000 PT Time Calculation (min) (ACUTE ONLY): 38 min  Charges:  $Gait Training: 23-37 mins $Therapeutic Exercise: 8-22 mins                     Coolidge Breeze, PT, DPT [facility] Rehabilitation Department Office: [phone] Weekend pager: [phone]   Coolidge Breeze 11/26/2021, 10:07 AM

## 2021-11-26 NOTE — TOC Transition Note (Signed)
Transition of Care [facility]) - CM/SW Discharge Note   Patient Details  Name: Thomas Li MRN: [id number] Date of Birth: 07-25-1940  [facility] CM/SW Contact:  Lennart Pall, LCSW Phone Number: 11/26/2021, 9:41 AM   Clinical Narrative:    Met with pt and family and confirming he has needed DME at home.  OPPT already arranged with [facility].  No TOC needs.   Final next level of care: OP Rehab Barriers to Discharge: No Barriers Identified   Patient Goals and CMS Choice Patient states their goals for this hospitalization and ongoing recovery are:: return home      Discharge Placement                       Discharge Plan and Services                DME Arranged: N/A                    Social Determinants of Health (SDOH) Interventions     Readmission Risk Interventions     No data to display

## 2021-11-26 NOTE — Progress Notes (Signed)
Discharge package printed and instructions given to patient and wife.

## 2021-11-26 NOTE — Anesthesia Procedure Notes (Signed)
  Anesthesia Regional Block: Adductor canal block   Pre-Anesthetic Checklist: , timeout performed,  Correct Patient, Correct Site, Correct Laterality,  Correct Procedure, Correct Position, site marked,  Risks and benefits discussed,  Surgical consent,  Pre-op evaluation,  At surgeon's request and post-op pain management  Laterality: Lower  Prep: chloraprep       Needles:  Injection technique: Single-shot  Needle Type: Echogenic Needle     Needle Length: 9cm  Needle Gauge: 22     Additional Needles:   Procedures:,,,, ultrasound used (permanent image in chart),,    Narrative:  Start time: 11/25/2021 9:07 AM End time: 11/25/2021 9:15 AM Injection made incrementally with aspirations every 5 mL.  Performed by: Personally  Anesthesiologist: [name], MD  Additional Notes: Block assessed prior to surgery. Pt tolerated procedure well.

## 2021-11-26 NOTE — Addendum Note (Signed)
Addendum  created 11/26/21 1906 by [name], MD   Child order released for a procedure order, Clinical Note Signed, Intraprocedure Blocks edited, Intraprocedure Meds edited, SmartForm saved

## 2021-11-26 NOTE — Progress Notes (Signed)
   Subjective: 1 Day Post-Op Procedure(s) (LRB): TOTAL KNEE ARTHROPLASTY (Left) Patient reports pain as mild.   Patient seen in rounds by Dr. Alvan Dame. Patient is well, and has had no acute complaints or problems. No acute events overnight. Foley catheter removed. Patient ambulated 24 feet with PT.  We will continue therapy today.   Objective: Vital signs in last 24 hours: Temp:  [97.2 F (36.2 C)-98.5 F (36.9 C)] 97.7 F (36.5 C) (11/01 0623) Pulse Rate:  [68-97] 68 (11/01 0623) Resp:  [13-28] 17 (11/01 0623) BP: (91-139)/(54-80) 121/63 (11/01 0623) SpO2:  [90 %-99 %] 92 % (11/01 0623) Weight:  [80.7 kg] 80.7 kg (10/31 0751)  Intake/Output from previous day:  Intake/Output Summary (Last 24 hours) at 11/26/2021 0741 Last data filed at 11/26/2021 0600 Gross per 24 hour  Intake 2282.5 ml  Output 3300 ml  Net -1017.5 ml     Intake/Output this shift: No intake/output data recorded.  Labs: Recent Labs    11/26/21 0337  HGB 12.1*   Recent Labs    11/26/21 0337  WBC 13.8*  RBC 3.37*  HCT 36.4*  PLT 221   Recent Labs    11/26/21 0337  NA 136  K 4.9  CL 107  CO2 25  BUN 12  CREATININE 0.86  GLUCOSE 180*  CALCIUM 8.3*   No results for input(s): "LABPT", "INR" in the last 72 hours.  Exam: General - Patient is Alert and Oriented Extremity - Neurologically intact Sensation intact distally Intact pulses distally Dorsiflexion/Plantar flexion intact Dressing - dressing C/D/I Motor Function - intact, moving foot and toes well on exam.   Past Medical History:  Diagnosis Date   Abnormal EKG    HX OF LEFT BUNDLE BRANCH BLOCK PER DR [place] NOTE 05-06-16 EPIC   Chronic combined systolic and diastolic heart failure ([place]) 07/18/2015   NO CURRENT CARDIOLOGIST ISSUE IMPROVED   Glaucoma    Hypercholesterolemia    Hypertension    Nocturnal polyuria    Plantar fasciitis    RIGHT FOOT   PONV (postoperative nausea and vomiting)    NEEDS NAUSEA PRE MED   Weak urinary  stream     Assessment/Plan: 1 Day Post-Op Procedure(s) (LRB): TOTAL KNEE ARTHROPLASTY (Left) Principal Problem:   S/P total knee arthroplasty, left  Estimated body mass index is 28.73 kg/m as calculated from the following:   Height as of this encounter: '5\' 6"'$  (1.676 m).   Weight as of this encounter: 80.7 kg. Advance diet Up with therapy D/C IV fluids   Patient's anticipated LOS is less than 2 midnights, meeting these requirements: - Younger than 51 - Lives within 1 hour of care - Has a competent adult at home to recover with post-op recover - NO history of  - Chronic pain requiring opiods  - Diabetes  - Coronary Artery Disease  - Heart failure  - Heart attack  - Stroke  - DVT/VTE  - Cardiac arrhythmia  - Respiratory Failure/COPD  - Renal failure  - Anemia  - Advanced Liver disease     DVT Prophylaxis - Aspirin Weight bearing as tolerated.  Hgb stable at 12.1 this AM.  Plan is to go Home after hospital stay. Plan for discharge today following 1-2 sessions of PT as long as they are meeting their goals. Patient is scheduled for OPPT. Follow up in the office in 2 weeks.   Griffith Citron, PA-C Orthopedic Surgery [phone] 11/26/2021, 7:41 AM

## 2021-11-26 NOTE — Plan of Care (Signed)
  Problem: Pain Management: Goal: Pain level will decrease with appropriate interventions Outcome: Progressing   Problem: Activity: Goal: Risk for activity intolerance will decrease Outcome: Progressing

## 2021-11-26 NOTE — Plan of Care (Signed)
Plan of care reviewed and discussed with the patient. 

## 2021-11-27 ENCOUNTER — Other Ambulatory Visit: Payer: Self-pay | Admitting: Family Medicine

## 2021-11-28 DIAGNOSIS — M25611 Stiffness of right shoulder, not elsewhere classified: Secondary | ICD-10-CM | POA: Diagnosis not present

## 2021-11-28 DIAGNOSIS — M25561 Pain in right knee: Secondary | ICD-10-CM | POA: Diagnosis not present

## 2021-12-01 DIAGNOSIS — M25562 Pain in left knee: Secondary | ICD-10-CM | POA: Diagnosis not present

## 2021-12-02 NOTE — Discharge Summary (Signed)
Patient ID: Thomas Li MRN: [id number] DOB/AGE: 09/23/1940 81 y.o.  Admit date: 11/25/2021 Discharge date: 11/26/2021  Admission Diagnoses:  Left knee osteoarthritis  Discharge Diagnoses:  Principal Problem:   S/P total knee arthroplasty, left   Past Medical History:  Diagnosis Date   Abnormal EKG    HX OF LEFT BUNDLE BRANCH BLOCK PER DR [place] NOTE 05-06-16 EPIC   Chronic combined systolic and diastolic heart failure ([place]) 07/18/2015   NO CURRENT CARDIOLOGIST ISSUE IMPROVED   Glaucoma    Hypercholesterolemia    Hypertension    Nocturnal polyuria    Plantar fasciitis    RIGHT FOOT   PONV (postoperative nausea and vomiting)    NEEDS NAUSEA PRE MED   Weak urinary stream     Surgeries: Procedure(s): TOTAL KNEE ARTHROPLASTY on 11/25/2021   Consultants:   Discharged Condition: Improved  Hospital Course: Thomas Li is an 81 y.o. male who was admitted 11/25/2021 for operative treatment ofS/P total knee arthroplasty, left. Patient has severe unremitting pain that affects sleep, daily activities, and work/hobbies. After pre-op clearance the patient was taken to the operating room on 11/25/2021 and underwent  Procedure(s): TOTAL KNEE ARTHROPLASTY.    Patient was given perioperative antibiotics:  Anti-infectives (From admission, onward)    Start     Dose/Rate Route Frequency Ordered Stop   11/25/21 1600  ceFAZolin (ANCEF) IVPB 2g/100 mL premix        2 g 200 mL/hr over 30 Minutes Intravenous Every 6 hours 11/25/21 1357 11/25/21 2310   11/25/21 0745  ceFAZolin (ANCEF) IVPB 2g/100 mL premix        2 g 200 mL/hr over 30 Minutes Intravenous On call to O.R. 11/25/21 0734 11/25/21 1038        Patient was given sequential compression devices, early ambulation, and chemoprophylaxis to prevent DVT. Patient worked with PT and was meeting their goals regarding safe ambulation and transfers.  Patient benefited maximally from hospital stay and there were no complications.     Recent vital signs: No data found.   Recent laboratory studies: No results for input(s): "WBC", "HGB", "HCT", "PLT", "NA", "K", "CL", "CO2", "BUN", "CREATININE", "GLUCOSE", "INR", "CALCIUM" in the last 72 hours.  Invalid input(s): "PT", "2"   Discharge Medications:   Allergies as of 11/26/2021   No Known Allergies      Medication List     TAKE these medications    acetaminophen 500 MG tablet Commonly known as: TYLENOL Take 2 tablets (1,000 mg total) by mouth every 6 (six) hours.   amLODipine 5 MG tablet Commonly known as: NORVASC TAKE 1 TABLET (5 MG TOTAL) BY MOUTH DAILY.   aspirin 81 MG chewable tablet Chew 1 tablet (81 mg total) by mouth 2 (two) times daily for 28 days. For prevention of blood clot   calcium carbonate 500 MG chewable tablet Commonly known as: TUMS - dosed in mg elemental calcium Chew 1 tablet by mouth daily as needed for indigestion or heartburn.   carboxymethylcellulose 0.5 % Soln Commonly known as: REFRESH PLUS Place 1 drop into both eyes at bedtime.   celecoxib 200 MG capsule Commonly known as: CELEBREX Take 1 capsule (200 mg total) by mouth 2 (two) times daily. What changed: See the new instructions.   CoQ-10 200 MG Caps Take 200 mg by mouth at bedtime.   famotidine 20 MG tablet Commonly known as: PEPCID TAKE 1 TABLET BY MOUTH EVERY DAY   fluticasone 50 MCG/ACT nasal spray Commonly known as: [place]  1 spray into both nostrils daily as needed for allergies or rhinitis.   gabapentin 300 MG capsule Commonly known as: NEURONTIN TAKE 1 CAPSULE BY MOUTH THREE TIMES A DAY What changed: See the new instructions.   latanoprost 0.005 % ophthalmic solution Commonly known as: XALATAN Place 1 drop into both eyes at bedtime.   losartan 100 MG tablet Commonly known as: COZAAR TAKE 1 TABLET BY MOUTH EVERY DAY   methocarbamol 500 MG tablet Commonly known as: ROBAXIN Take 1 tablet (500 mg total) by mouth every 6 (six) hours as needed  for muscle spasms.   multivitamin with minerals Tabs tablet Take 2 tablets by mouth 2 (two) times daily.   ondansetron 4 MG tablet Commonly known as: ZOFRAN Take 1 tablet (4 mg total) by mouth every 6 (six) hours as needed for nausea.   oxyCODONE 5 MG immediate release tablet Commonly known as: Oxy IR/ROXICODONE Take 1 tablet (5 mg total) by mouth every 4 (four) hours as needed for severe pain.   polyethylene glycol 17 g packet Commonly known as: MIRALAX / GLYCOLAX Take 17 g by mouth daily.   senna 8.6 MG Tabs tablet Commonly known as: SENOKOT Take 2 tablets (17.2 mg total) by mouth at bedtime for 14 days.   tolterodine 2 MG tablet Commonly known as: DETROL Take 2 mg by mouth 2 (two) times daily.   Vitamin C Powd Take 1 Dose by mouth daily.   VITAMIN D PO Take 1 capsule by mouth daily.               Discharge Care Instructions  (From admission, onward)           Start     Ordered   11/26/21 0000  Change dressing       Comments: Maintain surgical dressing until follow up in the clinic. If the edges start to pull up, may reinforce with tape. If the dressing is no longer working, may remove and cover with gauze and tape, but must keep the area dry and clean.  Call with any questions or concerns.   11/26/21 0745            Diagnostic Studies: No results found.  Disposition: Discharge disposition: 01-Home or Self Care       Discharge Instructions     Call MD / Call 911   Complete by: As directed    If you experience chest pain or shortness of breath, CALL 911 and be transported to the hospital emergency room.  If you develope a fever above 101 F, pus (white drainage) or increased drainage or redness at the wound, or calf pain, call your surgeon's office.   Change dressing   Complete by: As directed    Maintain surgical dressing until follow up in the clinic. If the edges start to pull up, may reinforce with tape. If the dressing is no longer  working, may remove and cover with gauze and tape, but must keep the area dry and clean.  Call with any questions or concerns.   Constipation Prevention   Complete by: As directed    Drink plenty of fluids.  Prune juice may be helpful.  You may use a stool softener, such as Colace (over the counter) 100 mg twice a day.  Use MiraLax (over the counter) for constipation as needed.   Diet - low sodium heart healthy   Complete by: As directed    Increase activity slowly as tolerated   Complete by: As  directed    Weight bearing as tolerated with assist device (walker, cane, etc) as directed, use it as long as suggested by your surgeon or therapist, typically at least 4-6 weeks.   Post-operative opioid taper instructions:   Complete by: As directed    POST-OPERATIVE OPIOID TAPER INSTRUCTIONS: It is important to wean off of your opioid medication as soon as possible. If you do not need pain medication after your surgery it is ok to stop day one. Opioids include: Codeine, Hydrocodone(Norco, Vicodin), Oxycodone(Percocet, oxycontin) and hydromorphone amongst others.  Long term and even short term use of opiods can cause: Increased pain response Dependence Constipation Depression Respiratory depression And more.  Withdrawal symptoms can include Flu like symptoms Nausea, vomiting And more Techniques to manage these symptoms Hydrate well Eat regular healthy meals Stay active Use relaxation techniques(deep breathing, meditating, yoga) Do Not substitute Alcohol to help with tapering If you have been on opioids for less than two weeks and do not have pain than it is ok to stop all together.  Plan to wean off of opioids This plan should start within one week post op of your joint replacement. Maintain the same interval or time between taking each dose and first decrease the dose.  Cut the total daily intake of opioids by one tablet each day Next start to increase the time between doses. The last  dose that should be eliminated is the evening dose.      TED hose   Complete by: As directed    Use stockings (TED hose) for 2 weeks on both leg(s).  You may remove them at night for sleeping.        Follow-up Information     Paralee Cancel, MD. Go on 12/10/2021.   Specialty: Orthopedic Surgery Why: You are scheduled for first post op appointment on Wednesday Nov 15 at 3:45pm Contact information: [address] [place] [address] [phone]                  Signed: Irving Copas 12/02/2021, 3:53 PM

## 2021-12-04 DIAGNOSIS — M25562 Pain in left knee: Secondary | ICD-10-CM | POA: Diagnosis not present

## 2021-12-07 ENCOUNTER — Other Ambulatory Visit: Payer: Self-pay | Admitting: Family Medicine

## 2021-12-08 DIAGNOSIS — M25562 Pain in left knee: Secondary | ICD-10-CM | POA: Diagnosis not present

## 2021-12-10 DIAGNOSIS — M25562 Pain in left knee: Secondary | ICD-10-CM | POA: Diagnosis not present

## 2021-12-12 ENCOUNTER — Telehealth: Payer: Self-pay | Admitting: Pharmacist

## 2021-12-12 DIAGNOSIS — M25562 Pain in left knee: Secondary | ICD-10-CM | POA: Diagnosis not present

## 2021-12-12 NOTE — Progress Notes (Signed)
Chronic Care Management Pharmacy Assistant   Name: Thomas Li  MRN: [id number] DOB: August 29, 1940   Reason for Encounter: Hypertension Adherence Call    Recent office visits:  07/16/2021 OV ([facility]) Tawnya Crook, MD; no medication changes indicated.  Recent consult visits:  06/27/2021 OV ([facility]) Neomia Glass, MD; no medication changes indicated.  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  Medications: Outpatient Encounter Medications as of 12/12/2021  Medication Sig   losartan (COZAAR) 100 MG tablet TAKE 1 TABLET BY MOUTH EVERY DAY   acetaminophen (TYLENOL) 500 MG tablet Take 2 tablets (1,000 mg total) by mouth every 6 (six) hours.   amLODipine (NORVASC) 5 MG tablet TAKE 1 TABLET (5 MG TOTAL) BY MOUTH DAILY.   Ascorbic Acid (VITAMIN C) POWD Take 1 Dose by mouth daily.   aspirin 81 MG chewable tablet Chew 1 tablet (81 mg total) by mouth 2 (two) times daily for 28 days. For prevention of blood clot   calcium carbonate (TUMS - DOSED IN MG ELEMENTAL CALCIUM) 500 MG chewable tablet Chew 1 tablet by mouth daily as needed for indigestion or heartburn.   carboxymethylcellulose (REFRESH PLUS) 0.5 % SOLN Place 1 drop into both eyes at bedtime.   celecoxib (CELEBREX) 200 MG capsule Take 1 capsule (200 mg total) by mouth 2 (two) times daily.   Coenzyme Q10 (COQ-10) 200 MG CAPS Take 200 mg by mouth at bedtime.   famotidine (PEPCID) 20 MG tablet TAKE 1 TABLET BY MOUTH EVERY DAY   fluticasone (FLONASE) 50 MCG/ACT nasal spray Place 1 spray into both nostrils daily as needed for allergies or rhinitis.   gabapentin (NEURONTIN) 300 MG capsule TAKE 1 CAPSULE BY MOUTH THREE TIMES A DAY (Patient taking differently: Take 300 mg by mouth 2 (two) times daily.)   latanoprost (XALATAN) 0.005 % ophthalmic solution Place 1 drop into both eyes at bedtime.   methocarbamol (ROBAXIN) 500 MG tablet Take 1 tablet (500 mg total) by mouth every 6 (six) hours as needed for  muscle spasms.   Multiple Vitamin (MULTIVITAMIN WITH MINERALS) TABS tablet Take 2 tablets by mouth 2 (two) times daily.   ondansetron (ZOFRAN) 4 MG tablet Take 1 tablet (4 mg total) by mouth every 6 (six) hours as needed for nausea.   oxyCODONE (OXY IR/ROXICODONE) 5 MG immediate release tablet Take 1 tablet (5 mg total) by mouth every 4 (four) hours as needed for severe pain.   polyethylene glycol (MIRALAX / GLYCOLAX) 17 g packet Take 17 g by mouth daily.   rosuvastatin (CRESTOR) 10 MG tablet Take 1 tablet by mouth once daily   tolterodine (DETROL) 2 MG tablet Take 2 mg by mouth 2 (two) times daily.   VITAMIN D PO Take 1 capsule by mouth daily.   No facility-administered encounter medications on file as of 12/12/2021.   Reviewed chart prior to disease state call. Spoke with patient regarding BP  Recent Office Vitals: BP Readings from Last 3 Encounters:  11/26/21 115/76  11/14/21 (!) 146/72  07/16/21 130/78   Pulse Readings from Last 3 Encounters:  11/26/21 72  11/14/21 88  07/16/21 62    Wt Readings from Last 3 Encounters:  11/25/21 178 lb (80.7 kg)  11/14/21 178 lb (80.7 kg)  07/16/21 176 lb 8 oz (80.1 kg)     Kidney Function Lab Results  Component Value Date/Time   CREATININE 0.86 11/26/2021 03:37 AM   CREATININE 0.93 11/14/2021 08:46 AM   CREATININE 0.90 09/27/2019 10:09  AM   CREATININE 0.86 07/25/2015 11:38 AM   GFR 81.66 07/16/2021 10:53 AM   GFRNONAA >60 11/26/2021 03:37 AM       Latest Ref Rng & Units 11/26/2021    3:37 AM 11/14/2021    8:46 AM 07/16/2021   10:53 AM  BMP  Glucose 70 - 99 mg/dL 180  138  102   BUN 8 - 23 mg/dL '12  13  12   '$ Creatinine 0.61 - 1.24 mg/dL 0.86  0.93  0.88   Sodium 135 - 145 mmol/L 136  134  137   Potassium 3.5 - 5.1 mmol/L 4.9  4.1  4.3   Chloride 98 - 111 mmol/L 107  102  102   CO2 22 - 32 mmol/L '25  25  28   '$ Calcium 8.9 - 10.3 mg/dL 8.3  8.7  9.4     Current antihypertensive regimen:  Amlodipine 5 mg daily Losartan 100 mg  daily  How often are you checking your Blood Pressure? daily  Current home BP readings: 126-142/70-72  What recent interventions/DTPs have been made by any provider to improve Blood Pressure control since last CPP Visit: No recent interventions or DTPs.  Any recent hospitalizations or ED visits since last visit with CPP? No  What diet changes have been made to improve Blood Pressure Control?  Patient states he hasn't had any recent diet changes. He states he eats 3 balanced meals a day.  What exercise is being done to improve your Blood Pressure Control?  Patient states he likes bicycling and walking.  Adherence Review: Is the patient currently on ACE/ARB medication? No Does the patient have >5 day gap between last estimated fill dates? No  -Patient scheduled a follow up telephone call with pharmacist on 02/09/2022.  Care Gaps: Medicare Annual Wellness: Overdue - never done Hemoglobin A1C: 6.6% on 07/16/2021  Future Appointments  Date Time Provider [place]  02/09/2022 10:30 AM [facility] CCM PHARMACIST [facility]  06/15/2022  8:20 AM Vivi Barrack, MD [facility]   Star Rating Drugs: Losartan 100 mg last filled 12/08/2021 90 DS Rosuvastatin 10 mg last filled 11/27/2021 90 DS  April D Calhoun, [place] Pharmacist Assistant [phone]

## 2021-12-15 ENCOUNTER — Ambulatory Visit (INDEPENDENT_AMBULATORY_CARE_PROVIDER_SITE_OTHER): Payer: Medicare Other | Admitting: Family Medicine

## 2021-12-15 ENCOUNTER — Encounter: Payer: Self-pay | Admitting: Family Medicine

## 2021-12-15 VITALS — BP 155/75 | HR 91 | Temp 97.8°F | Ht 66.0 in | Wt 189.6 lb

## 2021-12-15 DIAGNOSIS — I10 Essential (primary) hypertension: Secondary | ICD-10-CM | POA: Diagnosis not present

## 2021-12-15 DIAGNOSIS — E7800 Pure hypercholesterolemia, unspecified: Secondary | ICD-10-CM

## 2021-12-15 DIAGNOSIS — M25562 Pain in left knee: Secondary | ICD-10-CM | POA: Diagnosis not present

## 2021-12-15 DIAGNOSIS — R7303 Prediabetes: Secondary | ICD-10-CM

## 2021-12-15 LAB — POCT GLYCOSYLATED HEMOGLOBIN (HGB A1C): Hemoglobin A1C: 5.9 % — AB (ref 4.0–5.6)

## 2021-12-15 NOTE — Assessment & Plan Note (Signed)
On Crestor 10 mg daily.  Check lipids next blood draw.

## 2021-12-15 NOTE — Progress Notes (Signed)
   Thomas Li is a 81 y.o. male who presents today for an office visit.  Assessment/Plan:  Chronic Problems Addressed Today: Hypertension Mildly elevated today though just had knee replacement surgery and is doing with some pain.  He is typically been well controlled.  We will continue current regimen Norvasc 5 mg daily and losartan 100 mg daily.  Continue home monitoring.  He will let us know if persistently elevated.  Hypercholesterolemia On Crestor 10 mg daily.  Check lipids next blood draw.  Pre-diabetes A1c improved to 5.9.  Not currently on any medications.  We discussed lifestyle modifications including cutting down sugar.  We can recheck again in 6 months.    Subjective:  HPI:  See a/p for status of chronic conditions.         Objective:  Physical Exam: BP (!) 155/75   Pulse 91   Temp 97.8 F (36.6 C) (Temporal)   Ht '5\' 6"'$  (1.676 m)   Wt 189 lb 9.6 oz (86 kg)   SpO2 96%   BMI 30.60 kg/m   Gen: No acute distress, resting comfortably CV: Regular rate and rhythm with 2/6 systolic murmur appreciated Pulm: Normal work of breathing, clear to auscultation bilaterally with no crackles, wheezes, or rhonchi Neuro: Grossly normal, moves all extremities Psych: Normal affect and thought content      Johnrobert Foti M. Jerline Pain, MD 12/15/2021 8:40 AM

## 2021-12-15 NOTE — Assessment & Plan Note (Signed)
A1c improved to 5.9.  Not currently on any medications.  We discussed lifestyle modifications including cutting down sugar.  We can recheck again in 6 months.

## 2021-12-15 NOTE — Assessment & Plan Note (Signed)
Mildly elevated today though just had knee replacement surgery and is doing with some pain.  He is typically been well controlled.  We will continue current regimen Norvasc 5 mg daily and losartan 100 mg daily.  Continue home monitoring.  He will let us know if persistently elevated.

## 2021-12-15 NOTE — Patient Instructions (Signed)
It was very nice to see you today!  Your A1c today is 5.9.  Please keep an eye on your blood pressure and let us know if it is persistently elevated.  We will see you back in 6 months.  Please come back to see [place] sooner if needed.  Take care, Dr Jerline Pain  PLEASE NOTE:  If you had any lab tests please let us know if you have not heard back within a few days. You may see your results on mychart before we have a chance to review them but we will give you a call once they are reviewed by [place]. If we ordered any referrals today, please let us know if you have not heard from their office within the next week.   Please try these tips to maintain a healthy lifestyle:  Eat at least 3 REAL meals and 1-2 snacks per day.  Aim for no more than 5 hours between eating.  If you eat breakfast, please do so within one hour of getting up.   Each meal should contain half fruits/vegetables, one quarter protein, and one quarter carbs (no bigger than a computer mouse)  Cut down on sweet beverages. This includes juice, soda, and sweet tea.   Drink at least 1 glass of water with each meal and aim for at least 8 glasses per day  Exercise at least 150 minutes every week.

## 2021-12-17 DIAGNOSIS — M25562 Pain in left knee: Secondary | ICD-10-CM | POA: Diagnosis not present

## 2021-12-22 ENCOUNTER — Other Ambulatory Visit: Payer: Self-pay | Admitting: Family Medicine

## 2021-12-23 DIAGNOSIS — M25562 Pain in left knee: Secondary | ICD-10-CM | POA: Diagnosis not present

## 2021-12-25 DIAGNOSIS — M25611 Stiffness of right shoulder, not elsewhere classified: Secondary | ICD-10-CM | POA: Diagnosis not present

## 2021-12-25 DIAGNOSIS — M25561 Pain in right knee: Secondary | ICD-10-CM | POA: Diagnosis not present

## 2021-12-30 DIAGNOSIS — M25561 Pain in right knee: Secondary | ICD-10-CM | POA: Diagnosis not present

## 2021-12-30 DIAGNOSIS — M25611 Stiffness of right shoulder, not elsewhere classified: Secondary | ICD-10-CM | POA: Diagnosis not present

## 2022-01-01 DIAGNOSIS — H35363 Drusen (degenerative) of macula, bilateral: Secondary | ICD-10-CM | POA: Diagnosis not present

## 2022-01-01 DIAGNOSIS — H2513 Age-related nuclear cataract, bilateral: Secondary | ICD-10-CM | POA: Diagnosis not present

## 2022-01-01 DIAGNOSIS — H402231 Chronic angle-closure glaucoma, bilateral, mild stage: Secondary | ICD-10-CM | POA: Diagnosis not present

## 2022-01-01 DIAGNOSIS — H04123 Dry eye syndrome of bilateral lacrimal glands: Secondary | ICD-10-CM | POA: Diagnosis not present

## 2022-01-06 DIAGNOSIS — M25611 Stiffness of right shoulder, not elsewhere classified: Secondary | ICD-10-CM | POA: Diagnosis not present

## 2022-01-06 DIAGNOSIS — M25561 Pain in right knee: Secondary | ICD-10-CM | POA: Diagnosis not present

## 2022-01-07 DIAGNOSIS — Z5189 Encounter for other specified aftercare: Secondary | ICD-10-CM | POA: Diagnosis not present

## 2022-01-13 DIAGNOSIS — M25561 Pain in right knee: Secondary | ICD-10-CM | POA: Diagnosis not present

## 2022-01-13 DIAGNOSIS — M25611 Stiffness of right shoulder, not elsewhere classified: Secondary | ICD-10-CM | POA: Diagnosis not present

## 2022-01-28 ENCOUNTER — Other Ambulatory Visit: Payer: Self-pay | Admitting: Family Medicine

## 2022-02-09 ENCOUNTER — Telehealth: Payer: Medicare Other

## 2022-02-10 ENCOUNTER — Other Ambulatory Visit: Payer: Self-pay | Admitting: Family Medicine

## 2022-02-10 NOTE — Progress Notes (Signed)
Care Management & Coordination Services Pharmacy Note  02/10/2022 Name:  Thomas Li MRN:  [id number] DOB:  1940/11/22  Summary: PharmD FU visit.  No recommendations at this time.  He is recovered from knee replacement surgery and off all pain meds.  BP elevated in office but was during recovery so was in pain.  Reports today he is back to normal routine before surgery.  Recommendations/Changes made from today's visit: None  Follow up plan: FU 1 year   Subjective: Thomas Li is an 82 y.o. year old male who is a primary patient of Jerline Pain, Algis Greenhouse, MD.  The care coordination team was consulted for assistance with disease management and care coordination needs.    Engaged with patient by telephone for follow up visit.  Recent office visits:  07/16/2021 OV ([facility]) Tawnya Crook, MD; no medication changes indicated.   Recent consult visits:  06/27/2021 OV ([facility]) Neomia Glass, MD; no medication changes indicated.   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   Objective:  Lab Results  Component Value Date   CREATININE 0.86 11/26/2021   BUN 12 11/26/2021   GFR 81.66 07/16/2021   GFRNONAA >60 11/26/2021   NA 136 11/26/2021   K 4.9 11/26/2021   CALCIUM 8.3 (L) 11/26/2021   CO2 25 11/26/2021   GLUCOSE 180 (H) 11/26/2021    Lab Results  Component Value Date/Time   HGBA1C 5.9 (A) 12/15/2021 08:39 AM   HGBA1C 6.6 (H) 07/16/2021 10:53 AM   HGBA1C 6.3 03/27/2020 08:45 AM   GFR 81.66 07/16/2021 10:53 AM   GFR 82.70 03/27/2020 08:45 AM   MICROALBUR 0.3 09/27/2019 10:09 AM   MICROALBUR <0.7 06/16/2017 08:41 AM    Last diabetic Eye exam: No results found for: "HMDIABEYEEXA"  Last diabetic Foot exam: No results found for: "HMDIABFOOTEX"   Lab Results  Component Value Date   CHOL 114 07/16/2021   HDL 58.70 07/16/2021   LDLCALC 40 07/16/2021   LDLDIRECT 105.0 06/16/2017   TRIG 73.0 07/16/2021   CHOLHDL 2 07/16/2021        Latest Ref Rng & Units 07/16/2021   10:53 AM 08/05/2020   11:34 AM 03/27/2020    8:45 AM  Hepatic Function  Total Protein 6.0 - 8.3 g/dL 6.8  6.7  7.1   Albumin 3.5 - 5.2 g/dL 4.3  4.3  4.4   AST 0 - 37 U/L '23  17  20   '$ ALT 0 - 53 U/L 35  27  25   Alk Phosphatase 39 - 117 U/L 44  64  64   Total Bilirubin 0.2 - 1.2 mg/dL 0.5  0.3  0.4     Lab Results  Component Value Date/Time   TSH 2.79 07/16/2021 10:53 AM   TSH 3.08 09/27/2019 10:09 AM       Latest Ref Rng & Units 11/26/2021    3:37 AM 11/14/2021    8:46 AM 07/16/2021   10:53 AM  CBC  WBC 4.0 - 10.5 K/uL 13.8  5.2  4.3   Hemoglobin 13.0 - 17.0 g/dL 12.1  14.1  14.2   Hematocrit 39.0 - 52.0 % 36.4  42.4  42.9   Platelets 150 - 400 K/uL 221  250  256.0     Lab Results  Component Value Date/Time   VITAMINB12 603 09/27/2019 10:09 AM   VITAMINB12 589 12/17/2017 08:42 AM    Clinical ASCVD: Yes  The ASCVD Risk score (Arnett DK, et  al., 2019) failed to calculate for the following reasons:   The 2019 ASCVD risk score is only valid for ages 67 to 27        12/15/2021    8:07 AM 12/12/2020    8:05 AM 05/30/2020    1:17 PM  Depression screen PHQ 2/9  Decreased Interest 0 0 0  Down, Depressed, Hopeless 0 0 0  PHQ - 2 Score 0 0 0     Social History   Tobacco Use  Smoking Status Former   Packs/day: 0.50   Years: 3.00   Total pack years: 1.50   Types: Cigarettes   Quit date: 01/27/1968   Years since quitting: 54.0  Smokeless Tobacco Never   BP Readings from Last 3 Encounters:  12/15/21 (!) 155/75  11/26/21 115/76  11/14/21 (!) 146/72   Pulse Readings from Last 3 Encounters:  12/15/21 91  11/26/21 72  11/14/21 88   Wt Readings from Last 3 Encounters:  12/15/21 189 lb 9.6 oz (86 kg)  11/25/21 178 lb (80.7 kg)  11/14/21 178 lb (80.7 kg)   BMI Readings from Last 3 Encounters:  12/15/21 30.60 kg/m  11/25/21 28.73 kg/m  11/14/21 28.73 kg/m    No Known Allergies  Medications Reviewed Today     Reviewed by  Betti Cruz, RMA (Registered Medical Assistant) on 12/15/21 at [place] List Status: <None>   Medication Order Taking? Sig Documenting Provider Last Dose Status Informant  acetaminophen (TYLENOL) 500 MG tablet [id number]  Take 2 tablets (1,000 mg total) by mouth every 6 (six) hours. Irving Copas, PA-C  Active   amLODipine (NORVASC) 5 MG tablet [id number]  TAKE 1 TABLET (5 MG TOTAL) BY MOUTH DAILY. Vivi Barrack, MD  Active Self  Ascorbic Acid (VITAMIN C) POWD [id number]  Take 1 Dose by mouth daily. [provider]  Active Self  aspirin 81 MG chewable tablet [id number]  Chew 1 tablet (81 mg total) by mouth 2 (two) times daily for 28 days. For prevention of blood clot Irving Copas, PA-C  Active   calcium carbonate (TUMS - DOSED IN MG ELEMENTAL CALCIUM) 500 MG chewable tablet [id number]  Chew 1 tablet by mouth daily as needed for indigestion or heartburn. [provider]  Active Self  carboxymethylcellulose (REFRESH PLUS) 0.5 % SOLN [id number]  Place 1 drop into both eyes at bedtime. [provider]  Active Self  celecoxib (CELEBREX) 200 MG capsule [id number]  Take 1 capsule (200 mg total) by mouth 2 (two) times daily. Irving Copas, PA-C  Active   Coenzyme Q10 (COQ-10) 200 MG CAPS [id number]  Take 200 mg by mouth at bedtime. [provider]  Active Self  famotidine (PEPCID) 20 MG tablet [id number]  TAKE 1 TABLET BY MOUTH EVERY DAY Vivi Barrack, MD  Active Self  fluticasone (FLONASE) 50 MCG/ACT nasal spray [id number]  Place 1 spray into both nostrils daily as needed for allergies or rhinitis. [provider]  Active Self  gabapentin (NEURONTIN) 300 MG capsule [id number]  TAKE 1 CAPSULE BY MOUTH THREE TIMES A DAY  Patient taking differently: Take 300 mg by mouth 2 (two) times daily.   Vivi Barrack, MD  Active Self  latanoprost (XALATAN) 0.005 % ophthalmic solution [id number]  Place 1 drop into both eyes at bedtime. [provider]   Active Self  losartan (COZAAR) 100 MG tablet [id number]  TAKE 1 TABLET BY MOUTH EVERY DAY Vivi Barrack, MD  Active   methocarbamol (  ROBAXIN) 500 MG tablet [id number]  Take 1 tablet (500 mg total) by mouth every 6 (six) hours as needed for muscle spasms. Irving Copas, PA-C  Active   Multiple Vitamin (MULTIVITAMIN WITH MINERALS) TABS tablet [id number]  Take 2 tablets by mouth 2 (two) times daily. [provider]  Active Self  ondansetron (ZOFRAN) 4 MG tablet [id number]  Take 1 tablet (4 mg total) by mouth every 6 (six) hours as needed for nausea. Irving Copas, PA-C  Active   oxyCODONE (OXY IR/ROXICODONE) 5 MG immediate release tablet [id number]  Take 1 tablet (5 mg total) by mouth every 4 (four) hours as needed for severe pain. Irving Copas, PA-C  Active   polyethylene glycol (MIRALAX / GLYCOLAX) 17 g packet [id number]  Take 17 g by mouth daily. Irving Copas, PA-C  Active   rosuvastatin (CRESTOR) 10 MG tablet [id number]  Take 1 tablet by mouth once daily Vivi Barrack, MD  Active   tolterodine (DETROL) 2 MG tablet [id number]  Take 2 mg by mouth 2 (two) times daily. [provider]  Active Self  VITAMIN D PO [id number]  Take 1 capsule by mouth daily. [provider]  Active Self            SDOH:  (Social Determinants of Health) assessments and interventions performed: No, done within the year  Financial Resource Strain: Low Risk  (03/03/2021)   Overall Financial Resource Strain (CARDIA)    Difficulty of Paying Living Expenses: Not hard at all    [place]: No Food Insecurity (11/25/2021)  Housing: Low Risk  (11/25/2021)  Transportation Needs: No Transportation Needs (11/25/2021)  Utilities: Not At Risk (11/25/2021)  Depression (PHQ2-9): Low Risk  (12/15/2021)  Financial Resource Strain: Low Risk  (03/03/2021)  Tobacco Use: Medium Risk (12/15/2021)    Medication Assistance: None required.  Patient affirms current coverage  meets needs.  Medication Access: Within the past 30 days, how often has patient missed a dose of medication? 0 Is a pillbox or other method used to improve adherence? Yes  Factors that may affect medication adherence? no barriers identified Are meds synced by current pharmacy? No  Are meds delivered by current pharmacy? No  Does patient experience delays in picking up medications due to transportation concerns? No   [facility] Reviewed: Is patient disadvantaged to use [facility]?: No  Current Rx insurance plan: Medicare Name and location of Current pharmacy:  [facility], [address] [place] [address] Phone: [phone] Fax: [phone]  [id number] [address], [place]. [place] [address] Phone: [phone] Fax: [phone]  [id number] [place]: [phone]: [phone] [place] [address] [place]: [phone]: [phone] [facility] services reviewed with patient today?: Yes  Patient requests to transfer care to [facility]?: No  Reason patient declined to change pharmacies: Loyalty to other pharmacy/Patient preference  Compliance/Adherence/Medication fill history: Care Gaps: Due for AWV  Star-Rating Drugs: Losartan '100mg'$  12/08/21 90ds   Assessment/Plan    Hypertension (BP goal <130/80) 02/13/22 -Controlled, based on home monitoring -Current treatment: Amlodipine '5mg'$  daily Appropriate, Effective, Safe, Accessible  Losartan '100mg'$  daily Appropriate, Effective, Safe, Accessible -Medications previously tried: none noted -Current home readings: reports majority home readings are  at goal.  No specific logs  reviewed today -Current dietary habits: eats well, trying to watch sweets/carbs etc. -Current exercise habits: still works full time in [employer], minimal outside of normal work but does have active job -Denies hypotensive/hypertensive symptoms -Educated on BP goals and benefits of medications for prevention of heart attack, stroke and kidney damage; Importance of home blood pressure monitoring; -Slightly elevated last office visit - denies these readings at home. -Denies HA or dizziness. No changes to meds , continue to monitor at home - possibly record.  Hyperlipidemia: (LDL goal < 70) -Controlled, not assessed -Current treatment: Rosuvastatin '10mg'$  daily Appropriate, Effective, Safe, Accessible -Medications previously tried: none ntoed  -Current dietary patterns: see HTn -Current exercise habits: see HTN -Educated on Cholesterol goals;  Benefits of statin for ASCVD risk reduction; Importance of limiting foods high in cholesterol; -Recommended to continue current medication Most recent LDL is excellent, no issues with adverse effects  Pre-Diabetes (A1c goal <6.5%) -Controlled -Current medications: None noted -Medications previously tried: none  -Current home glucose readings fasting glucose: not checking post prandial glucose: not checking -Denies hypoglycemic/hyperglycemic symptoms -Educated on A1c and blood sugar goals; Carbs and effects on glucose -Counseled to check feet daily and get yearly eye exams -Recommend continue current management and routine screenings, continue to limit excess sweets and carbohydrates.  Heart Failure (Goal: manage symptoms and prevent exacerbations) -Controlled, not assessed -Last ejection fraction: 55-60% -Current treatment: Losartan '100mg'$  daily Appropriate, Effective, Safe, Accessible -Medications previously tried: none noted  -Current home BP/HR readings: not checked recently -Educated on Benefits of medications for managing symptoms  and prolonging life Importance of weighing daily; if you gain more than 3 pounds in one day or 5 pounds in one week, contact providers Importance of blood pressure control -Recommended to continue current medication Followed by cardiology  Neuropathy?? (Goal: Minimize symptoms) 02/13/22 -Controlled, based on patient symptoms -Current treatment  Gabapentin '300mg'$  tid Appropriate, Effective, Safe, Accessible -Medications previously tried: gabapentin -Still has some neuropathy but denies burning and stinging. -Reports he has started back on gabapentin - does not remember ever coming off of it.  Tolerates well, symptoms are managed at this time. NO changes continue adherence.  Patient Goals/Self-Care Activities Patient will:  - take medications as prescribed as evidenced by patient report and record review check blood pressure a few times per week, document, and provide at future appointments  Follow Up Plan: The care management team will reach out to the patient again over the next 180 days.       Beverly Milch, PharmD Clinical Pharmacist  [facility] [phone]

## 2022-02-13 ENCOUNTER — Ambulatory Visit: Payer: Medicare Other | Admitting: Pharmacist

## 2022-02-16 DIAGNOSIS — L570 Actinic keratosis: Secondary | ICD-10-CM | POA: Diagnosis not present

## 2022-02-20 ENCOUNTER — Other Ambulatory Visit: Payer: Self-pay | Admitting: Family Medicine

## 2022-03-12 DIAGNOSIS — L821 Other seborrheic keratosis: Secondary | ICD-10-CM | POA: Diagnosis not present

## 2022-03-12 DIAGNOSIS — L814 Other melanin hyperpigmentation: Secondary | ICD-10-CM | POA: Diagnosis not present

## 2022-03-12 DIAGNOSIS — Z85828 Personal history of other malignant neoplasm of skin: Secondary | ICD-10-CM | POA: Diagnosis not present

## 2022-03-12 DIAGNOSIS — L448 Other specified papulosquamous disorders: Secondary | ICD-10-CM | POA: Diagnosis not present

## 2022-03-12 DIAGNOSIS — D225 Melanocytic nevi of trunk: Secondary | ICD-10-CM | POA: Diagnosis not present

## 2022-03-12 DIAGNOSIS — Z08 Encounter for follow-up examination after completed treatment for malignant neoplasm: Secondary | ICD-10-CM | POA: Diagnosis not present

## 2022-03-12 DIAGNOSIS — L570 Actinic keratosis: Secondary | ICD-10-CM | POA: Diagnosis not present

## 2022-03-12 DIAGNOSIS — I872 Venous insufficiency (chronic) (peripheral): Secondary | ICD-10-CM | POA: Diagnosis not present

## 2022-03-30 ENCOUNTER — Other Ambulatory Visit: Payer: Self-pay | Admitting: Family Medicine

## 2022-04-30 ENCOUNTER — Other Ambulatory Visit: Payer: Self-pay | Admitting: Family Medicine

## 2022-05-25 ENCOUNTER — Other Ambulatory Visit: Payer: Self-pay | Admitting: Family Medicine

## 2022-06-15 ENCOUNTER — Ambulatory Visit (INDEPENDENT_AMBULATORY_CARE_PROVIDER_SITE_OTHER): Payer: Medicare Other | Admitting: Family Medicine

## 2022-06-15 ENCOUNTER — Encounter: Payer: Self-pay | Admitting: Family Medicine

## 2022-06-15 VITALS — BP 137/76 | HR 92 | Temp 97.8°F | Ht 66.0 in | Wt 185.6 lb

## 2022-06-15 DIAGNOSIS — R600 Localized edema: Secondary | ICD-10-CM | POA: Diagnosis not present

## 2022-06-15 DIAGNOSIS — R7303 Prediabetes: Secondary | ICD-10-CM

## 2022-06-15 DIAGNOSIS — I10 Essential (primary) hypertension: Secondary | ICD-10-CM | POA: Diagnosis not present

## 2022-06-15 LAB — POCT GLYCOSYLATED HEMOGLOBIN (HGB A1C): Hemoglobin A1C: 6.1 % — AB (ref 4.0–5.6)

## 2022-06-15 MED ORDER — AMLODIPINE BESYLATE 5 MG PO TABS: 5.0000 mg | ORAL_TABLET | Freq: Every day | ORAL | 1 refills | Status: DC

## 2022-06-15 NOTE — Assessment & Plan Note (Signed)
No red flags.  No signs of volume overload.  Leg edema likely multifactorial in setting of venous insufficiency and medication use.  We did discuss potentially trying off amlodipine to see if this helps with the symptoms however patient declined for now since his symptoms are not particularly bothersome.  We discussed conservative measures including leg elevation, salt avoidance, and compression stockings.  Follow-up again in 6 months though he will let us know if anything worsens in the interim.

## 2022-06-15 NOTE — Assessment & Plan Note (Signed)
A1c stable 6.1 without meds.  Recheck 6 months.  Discussed lifestyle modifications.

## 2022-06-15 NOTE — Patient Instructions (Signed)
It was very nice to see you today!  I will refill your medication today.  Please continue to work on diet and exercise  Please avoid salt and keep your legs elevated to help with the swelling.  Return in about 6 months (around 12/16/2022) for Follow Up.   Take care, Dr Jimmey Ralph  PLEASE NOTE:  If you had any lab tests, please let us know if you have not heard back within a few days. You may see your results on mychart before we have a chance to review them but we will give you a call once they are reviewed by [place].   If we ordered any referrals today, please let us know if you have not heard from their office within the next week.   If you had any urgent prescriptions sent in today, please check with the pharmacy within an hour of our visit to make sure the prescription was transmitted appropriately.   Please try these tips to maintain a healthy lifestyle:  Eat at least 3 REAL meals and 1-2 snacks per day.  Aim for no more than 5 hours between eating.  If you eat breakfast, please do so within one hour of getting up.   Each meal should contain half fruits/vegetables, one quarter protein, and one quarter carbs (no bigger than a computer mouse)  Cut down on sweet beverages. This includes juice, soda, and sweet tea.   Drink at least 1 glass of water with each meal and aim for at least 8 glasses per day  Exercise at least 150 minutes every week.

## 2022-06-15 NOTE — Progress Notes (Signed)
   Thomas Li is a 82 y.o. male who presents today for an office visit.  Assessment/Plan:  Chronic Problems Addressed Today: Pre-diabetes A1c stable 6.1 without meds.  Recheck 6 months.  Discussed lifestyle modifications.  Hypertension Blood pressure much better controlled today.  Will continue amlodipine 5 mg daily and losartan 100 mg daily.  Discussed with patient his amlodipine could likely be contributing some to his lower extremity edema however symptoms are not currently bothersome and he would like to continue with his current regimen for now.    Leg edema No red flags.  No signs of volume overload.  Leg edema likely multifactorial in setting of venous insufficiency and medication use.  We did discuss potentially trying off amlodipine to see if this helps with the symptoms however patient declined for now since his symptoms are not particularly bothersome.  We discussed conservative measures including leg elevation, salt avoidance, and compression stockings.  Follow-up again in 6 months though he will let us know if anything worsens in the interim.     Subjective:  HPI:  See Assessment / plan for status of chronic of conditions.  He has no acute concerns today however has noticed worsening lower extremity swelling since his surgery last year.  He has been trying to keep his legs elevated which does help a little bit.  No worsening shortness of breath.  No dyspnea on exertion.  No orthopnea.       Objective:  Physical Exam: BP 137/76   Pulse 92   Temp 97.8 F (36.6 C) (Temporal)   Ht 5\' 6"  (1.676 m)   Wt 185 lb 9.6 oz (84.2 kg)   SpO2 97%   BMI 29.96 kg/m   Gen: No acute distress, resting comfortably CV: Regular rate and rhythm with 2/6 systolic murmurs appreciated Pulm: Normal work of breathing, clear to auscultation bilaterally with no crackles, wheezes, or rhonchi MUSCULOSKELETAL: - 1+ pitting edema to knees bilaterally Neuro: Grossly normal, moves all  extremities Psych: Normal affect and thought content      Thomas Tadesse M. Jimmey Ralph, MD 06/15/2022 9:01 AM

## 2022-06-15 NOTE — Assessment & Plan Note (Signed)
Blood pressure much better controlled today.  Will continue amlodipine 5 mg daily and losartan 100 mg daily.  Discussed with patient his amlodipine could likely be contributing some to his lower extremity edema however symptoms are not currently bothersome and he would like to continue with his current regimen for now.

## 2022-08-11 ENCOUNTER — Other Ambulatory Visit: Payer: Self-pay | Admitting: Family Medicine

## 2022-08-23 ENCOUNTER — Other Ambulatory Visit: Payer: Self-pay | Admitting: Family Medicine

## 2022-08-27 DIAGNOSIS — Z85828 Personal history of other malignant neoplasm of skin: Secondary | ICD-10-CM | POA: Diagnosis not present

## 2022-08-27 DIAGNOSIS — L538 Other specified erythematous conditions: Secondary | ICD-10-CM | POA: Diagnosis not present

## 2022-08-27 DIAGNOSIS — D225 Melanocytic nevi of trunk: Secondary | ICD-10-CM | POA: Diagnosis not present

## 2022-08-27 DIAGNOSIS — L821 Other seborrheic keratosis: Secondary | ICD-10-CM | POA: Diagnosis not present

## 2022-08-27 DIAGNOSIS — L298 Other pruritus: Secondary | ICD-10-CM | POA: Diagnosis not present

## 2022-08-27 DIAGNOSIS — D492 Neoplasm of unspecified behavior of bone, soft tissue, and skin: Secondary | ICD-10-CM | POA: Diagnosis not present

## 2022-08-27 DIAGNOSIS — Z08 Encounter for follow-up examination after completed treatment for malignant neoplasm: Secondary | ICD-10-CM | POA: Diagnosis not present

## 2022-08-27 DIAGNOSIS — L570 Actinic keratosis: Secondary | ICD-10-CM | POA: Diagnosis not present

## 2022-08-27 DIAGNOSIS — L814 Other melanin hyperpigmentation: Secondary | ICD-10-CM | POA: Diagnosis not present

## 2022-09-05 ENCOUNTER — Other Ambulatory Visit: Payer: Self-pay | Admitting: Family Medicine

## 2022-10-22 DIAGNOSIS — R972 Elevated prostate specific antigen [PSA]: Secondary | ICD-10-CM | POA: Diagnosis not present

## 2022-10-22 DIAGNOSIS — N401 Enlarged prostate with lower urinary tract symptoms: Secondary | ICD-10-CM | POA: Diagnosis not present

## 2022-10-22 DIAGNOSIS — R3915 Urgency of urination: Secondary | ICD-10-CM | POA: Diagnosis not present

## 2022-11-17 ENCOUNTER — Other Ambulatory Visit: Payer: Self-pay | Admitting: Family Medicine

## 2022-12-08 ENCOUNTER — Other Ambulatory Visit: Payer: Self-pay | Admitting: *Deleted

## 2022-12-08 ENCOUNTER — Other Ambulatory Visit: Payer: Self-pay | Admitting: Family Medicine

## 2022-12-08 MED ORDER — FAMOTIDINE 20 MG PO TABS: 20.0000 mg | ORAL_TABLET | Freq: Every day | ORAL | 0 refills | Status: DC

## 2022-12-14 ENCOUNTER — Ambulatory Visit: Payer: Medicare Other | Admitting: Family Medicine

## 2022-12-14 ENCOUNTER — Ambulatory Visit (INDEPENDENT_AMBULATORY_CARE_PROVIDER_SITE_OTHER): Payer: Medicare Other | Admitting: Family Medicine

## 2022-12-14 ENCOUNTER — Encounter: Payer: Self-pay | Admitting: Family Medicine

## 2022-12-14 VITALS — BP 127/73 | HR 87 | Temp 98.2°F | Ht 66.0 in | Wt 186.2 lb

## 2022-12-14 DIAGNOSIS — R011 Cardiac murmur, unspecified: Secondary | ICD-10-CM

## 2022-12-14 DIAGNOSIS — M1990 Unspecified osteoarthritis, unspecified site: Secondary | ICD-10-CM | POA: Diagnosis not present

## 2022-12-14 DIAGNOSIS — R7303 Prediabetes: Secondary | ICD-10-CM

## 2022-12-14 DIAGNOSIS — E7800 Pure hypercholesterolemia, unspecified: Secondary | ICD-10-CM

## 2022-12-14 DIAGNOSIS — I10 Essential (primary) hypertension: Secondary | ICD-10-CM

## 2022-12-14 DIAGNOSIS — R972 Elevated prostate specific antigen [PSA]: Secondary | ICD-10-CM

## 2022-12-14 LAB — COMPREHENSIVE METABOLIC PANEL
ALT: 22 U/L (ref 0–53)
AST: 19 U/L (ref 0–37)
Albumin: 4.3 g/dL (ref 3.5–5.2)
Alkaline Phosphatase: 52 U/L (ref 39–117)
BUN: 11 mg/dL (ref 6–23)
CO2: 28 meq/L (ref 19–32)
Calcium: 9.5 mg/dL (ref 8.4–10.5)
Chloride: 102 meq/L (ref 96–112)
Creatinine, Ser: 0.81 mg/dL (ref 0.40–1.50)
GFR: 82.9 mL/min (ref >60.00)
Glucose, Bld: 92 mg/dL (ref 70–99)
Potassium: 4.2 meq/L (ref 3.5–5.1)
Sodium: 140 meq/L (ref 135–145)
Total Bilirubin: 0.4 mg/dL (ref 0.2–1.2)
Total Protein: 6.5 g/dL (ref 6.0–8.3)

## 2022-12-14 LAB — TSH: TSH: 3.55 u[IU]/mL (ref 0.35–5.50)

## 2022-12-14 LAB — POCT GLYCOSYLATED HEMOGLOBIN (HGB A1C): Hemoglobin A1C: 6.1 % — AB (ref 4.0–5.6)

## 2022-12-14 LAB — CBC
HCT: 44.7 % (ref 39.0–52.0)
Hemoglobin: 14.6 g/dL (ref 13.0–17.0)
MCHC: 32.6 g/dL (ref 30.0–36.0)
MCV: 103.9 fL — ABNORMAL HIGH (ref 78.0–100.0)
Platelets: 230 10*3/uL (ref 150.0–400.0)
RBC: 4.3 Mil/uL (ref 4.22–5.81)
RDW: 14 % (ref 11.5–15.5)
WBC: 4.7 10*3/uL (ref 4.0–10.5)

## 2022-12-14 LAB — LIPID PANEL
Cholesterol: 123 mg/dL (ref 0–200)
HDL: 46.8 mg/dL (ref >39.00)
LDL Cholesterol: 39 mg/dL (ref 0–99)
NonHDL: 76.17
Total CHOL/HDL Ratio: 3
Triglycerides: 187 mg/dL — ABNORMAL HIGH (ref 0.0–149.0)
VLDL: 37.4 mg/dL (ref 0.0–40.0)

## 2022-12-14 MED ORDER — AZELASTINE HCL 0.1 % NA SOLN
2.0000 | Freq: Two times a day (BID) | NASAL | 12 refills | Status: DC
Start: 2022-12-14 — End: 2023-06-14

## 2022-12-14 MED ORDER — CELECOXIB 200 MG PO CAPS: 200.0000 mg | ORAL_CAPSULE | Freq: Two times a day (BID) | ORAL | 0 refills | Status: DC

## 2022-12-14 MED ORDER — AZITHROMYCIN 250 MG PO TABS
ORAL_TABLET | ORAL | 0 refills | Status: DC
Start: 2022-12-14 — End: 2023-04-08

## 2022-12-14 NOTE — Assessment & Plan Note (Signed)
Continue management per urology. 

## 2022-12-14 NOTE — Assessment & Plan Note (Signed)
A1c stable 6.1.  Continue lifestyle modifications.

## 2022-12-14 NOTE — Assessment & Plan Note (Signed)
Blood pressure at goal today.  Continue amlodipine 5 mg daily and losartan 100 mg daily.

## 2022-12-14 NOTE — Assessment & Plan Note (Signed)
Has seen orthopedics in the past for knee osteoarthritis.  Likely has a flareup of osteoarthritis in his right ankle as above.  He will work on exercises.  Continue ice and compression.  He will start a short course of Celebrex.  Will need referral to sports medicine if not improving.

## 2022-12-14 NOTE — Assessment & Plan Note (Signed)
On Crestor 10 mg daily.  Check lipids. 

## 2022-12-14 NOTE — Progress Notes (Addendum)
   Thomas Li is a 82 y.o. male who presents today for an office visit.  Assessment/Plan:  New/Acute Problems: Sinusitis  No red flags.  Given length of symptoms will start azithromycin.  Also start Astelin.  Encouraged hydration.  He will let us know if not improving.  Right Ankle Pain No red flags.  No history of trauma to suggest fracture or sprain.  Likely has osteoarthritis flare.  Will restart Celebrex.  Discussed ice and compression.  He will let us know if not improving in the next 1-2 weeks and we can refer to sports medicine.  Chronic Problems Addressed Today: Pre-diabetes A1c stable 6.1.  Continue lifestyle modifications.  Osteoarthritis Has seen orthopedics in the past for knee osteoarthritis.  Likely has a flareup of osteoarthritis in his right ankle as above.  He will work on exercises.  Continue ice and compression.  He will start a short course of Celebrex.  Will need referral to sports medicine if not improving.  Hypertension Blood pressure at goal today.  Continue amlodipine 5 mg daily and losartan 100 mg daily.  Hypercholesterolemia On Crestor 10 mg daily.  Check lipids.  Elevated PSA Continue management per urology.      Subjective:  HPI:  See Assessment / plan for status of chronic conditions.  Patient here today for follow-up.  Last seen 6 months ago.  A1c at that time was 6.1.  He has been working on lifestyle modifications with diet and exercise.   He has had some pain in his right ankle for the last several weeks.  No obvious injuries or precipitating events.  Does have neuropathy at baseline but this is not changed recently.  Has never had anything like this before.  Some swelling to the area.  Worse with activity.  No other obvious aggravating or alleviating factors.  He also has been having more sinus congestion for the last several weeks.  Occasionally will produce yellow phlegm.  No fevers or chills.  No treatments tried.       Objective:   Physical Exam: BP 127/73   Pulse 87   Temp 98.2 F (36.8 C) (Temporal)   Ht 5\' 6"  (1.676 m)   Wt 186 lb 3.2 oz (84.5 kg)   SpO2 98%   BMI 30.05 kg/m    Gen: No acute distress, resting comfortably CV: Regular rate and rhythm with 3/6 systolic murmur appreciated Pulm: Normal work of breathing, clear to auscultation bilaterally with no crackles, wheezes, or rhonchi MUSCULOSKELETAL: - Right Ankle: Edema noted throughout right ankle.  Tenderness palpation along joint line.  Pain elicited with eversion.  Neurovascular intact distally. Neuro: Grossly normal, moves all extremities Psych: Normal affect and thought content      Taralee Marcus M. Jimmey Ralph, MD 12/14/2022 9:47 AM

## 2022-12-14 NOTE — Patient Instructions (Signed)
It was very nice to see you today!  Your A1c is same.  Please continue to work on diet and exercise.  You probably have a small flareup of arthritis in your ankle.  Please start the Celebrex.  Please continue to use ice and compression of the area.  Let me know if not improving.  You may have a sinus infection.  Please start the Astelin and Z-Pak.  Let us know if not improving.  Return in about 6 months (around 06/13/2023) for Follow Up.   Take care, Dr Jimmey Ralph  PLEASE NOTE:  If you had any lab tests, please let us know if you have not heard back within a few days. You may see your results on mychart before we have a chance to review them but we will give you a call once they are reviewed by [place].   If we ordered any referrals today, please let us know if you have not heard from their office within the next week.   If you had any urgent prescriptions sent in today, please check with the pharmacy within an hour of our visit to make sure the prescription was transmitted appropriately.   Please try these tips to maintain a healthy lifestyle:  Eat at least 3 REAL meals and 1-2 snacks per day.  Aim for no more than 5 hours between eating.  If you eat breakfast, please do so within one hour of getting up.   Each meal should contain half fruits/vegetables, one quarter protein, and one quarter carbs (no bigger than a computer mouse)  Cut down on sweet beverages. This includes juice, soda, and sweet tea.   Drink at least 1 glass of water with each meal and aim for at least 8 glasses per day  Exercise at least 150 minutes every week.

## 2022-12-15 NOTE — Progress Notes (Signed)
Great news!  Labs are all stable.  Do not need to make any changes to treatment plan.  He should continue to work on diet and exercise and we can recheck everything in a year or so.

## 2023-01-04 DIAGNOSIS — H402231 Chronic angle-closure glaucoma, bilateral, mild stage: Secondary | ICD-10-CM | POA: Diagnosis not present

## 2023-01-04 DIAGNOSIS — H04123 Dry eye syndrome of bilateral lacrimal glands: Secondary | ICD-10-CM | POA: Diagnosis not present

## 2023-01-04 DIAGNOSIS — H524 Presbyopia: Secondary | ICD-10-CM | POA: Diagnosis not present

## 2023-01-04 DIAGNOSIS — H2513 Age-related nuclear cataract, bilateral: Secondary | ICD-10-CM | POA: Diagnosis not present

## 2023-01-06 ENCOUNTER — Other Ambulatory Visit: Payer: Self-pay | Admitting: Family Medicine

## 2023-01-30 ENCOUNTER — Other Ambulatory Visit: Payer: Self-pay | Admitting: Family Medicine

## 2023-02-06 ENCOUNTER — Other Ambulatory Visit: Payer: Self-pay | Admitting: Family Medicine

## 2023-02-11 ENCOUNTER — Other Ambulatory Visit: Payer: Self-pay | Admitting: Family Medicine

## 2023-02-15 ENCOUNTER — Encounter: Payer: Medicare Other | Admitting: Pharmacist

## 2023-02-16 ENCOUNTER — Ambulatory Visit (INDEPENDENT_AMBULATORY_CARE_PROVIDER_SITE_OTHER): Payer: Medicare Other

## 2023-02-16 ENCOUNTER — Ambulatory Visit: Payer: Self-pay | Admitting: Family Medicine

## 2023-02-16 ENCOUNTER — Encounter: Payer: Self-pay | Admitting: Emergency Medicine

## 2023-02-16 ENCOUNTER — Ambulatory Visit (INDEPENDENT_AMBULATORY_CARE_PROVIDER_SITE_OTHER): Payer: Medicare Other | Admitting: Emergency Medicine

## 2023-02-16 VITALS — BP 122/62 | HR 90 | Temp 98.8°F | Ht 66.0 in | Wt 189.0 lb

## 2023-02-16 DIAGNOSIS — J22 Unspecified acute lower respiratory infection: Secondary | ICD-10-CM | POA: Diagnosis not present

## 2023-02-16 DIAGNOSIS — R059 Cough, unspecified: Secondary | ICD-10-CM | POA: Diagnosis not present

## 2023-02-16 DIAGNOSIS — R0989 Other specified symptoms and signs involving the circulatory and respiratory systems: Secondary | ICD-10-CM | POA: Diagnosis not present

## 2023-02-16 DIAGNOSIS — R051 Acute cough: Secondary | ICD-10-CM | POA: Diagnosis not present

## 2023-02-16 DIAGNOSIS — J9811 Atelectasis: Secondary | ICD-10-CM | POA: Diagnosis not present

## 2023-02-16 MED ORDER — AMOXICILLIN-POT CLAVULANATE 875-125 MG PO TABS: 1.0000 | ORAL_TABLET | Freq: Two times a day (BID) | ORAL | 0 refills | Status: AC

## 2023-02-16 NOTE — Assessment & Plan Note (Signed)
Clinically stable.  No red flag signs or symptoms. Afebrile. Chest x-ray done today.  Report reviewed. Recommend to start Augmentin 875 mg twice a day for 7 days ED precautions given Advised to rest and stay well-hydrated Advised to contact the office if no better or worse during the next several days.

## 2023-02-16 NOTE — Patient Instructions (Signed)
Stop ivermectin Start Augmentin 875 mg twice a day for 7 days Rest and stay well-hydrated Chest x-ray today Contact the office if no better or worse during the next several days  Acute Bronchitis, Adult  Acute bronchitis is when air tubes in the lungs (bronchi) suddenly get swollen. The condition can make it hard for you to breathe. In adults, acute bronchitis usually goes away within 2 weeks. A cough caused by bronchitis may last up to 3 weeks. Smoking, allergies, and asthma can make the condition worse. What are the causes? Germs that cause cold and flu (viruses). The most common cause of this condition is the virus that causes the common cold. Bacteria. Substances that bother (irritate) the lungs, including: Smoke from cigarettes and other types of tobacco. Dust and pollen. Fumes from chemicals, gases, or burned fuel. Indoor or outdoor air pollution. What increases the risk? A weak body's defense system. This is also called the immune system. Any condition that affects your lungs and breathing, such as asthma. What are the signs or symptoms? A cough. Coughing up clear, yellow, or green mucus. Making high-pitched whistling sounds when you breathe, most often when you breathe out (wheezing). Runny or stuffy nose. Having too much mucus in your lungs (chest congestion). Shortness of breath. Body aches. A sore throat. How is this treated? Acute bronchitis may go away over time without treatment. Your doctor may tell you to: Drink more fluids. This will help thin your mucus so it is easier to cough up. Use a device that gets medicine into your lungs (inhaler). Use a vaporizer or a humidifier. These are machines that add water to the air. This helps with coughing and poor breathing. Take a medicine that thins mucus and helps clear it from your lungs. Take a medicine that prevents or stops coughing. It is not common to take an antibiotic medicine for this condition. Follow these  instructions at home:  Take over-the-counter and prescription medicines only as told by your doctor. Use an inhaler, vaporizer, or humidifier as told by your doctor. Take two teaspoons (10 mL) of honey at bedtime. This helps lessen your coughing at night. Drink enough fluid to keep your pee (urine) pale yellow. Do not smoke or use any products that contain nicotine or tobacco. If you need help quitting, ask your doctor. Get a lot of rest. Return to your normal activities when your doctor says that it is safe. Keep all follow-up visits. How is this prevented?  Wash your hands often with soap and water for at least 20 seconds. If you cannot use soap and water, use hand sanitizer. Avoid contact with people who have cold symptoms. Try not to touch your mouth, nose, or eyes with your hands. Avoid breathing in smoke or chemical fumes. Make sure to get the flu shot every year. Contact a doctor if: Your symptoms do not get better in 2 weeks. You have trouble coughing up the mucus. Your cough keeps you awake at night. You have a fever. Get help right away if: You cough up blood. You have chest pain. You have very bad shortness of breath. You faint or keep feeling like you are going to faint. You have a very bad headache. Your fever or chills get worse. These symptoms may be an emergency. Get help right away. Call your local emergency services (911 in the [place] Do not wait to see if the symptoms will go away. Do not drive yourself to the hospital. Summary Acute bronchitis is  when air tubes in the lungs (bronchi) suddenly get swollen. In adults, acute bronchitis usually goes away within 2 weeks. Drink more fluids. This will help thin your mucus so it is easier to cough up. Take over-the-counter and prescription medicines only as told by your doctor. Contact a doctor if your symptoms do not improve after 2 weeks of treatment. This information is not intended to replace advice given to you  by your health care provider. Make sure you discuss any questions you have with your health care provider. Document Revised: 05/15/2020 Document Reviewed: 05/15/2020 [employer]vier Patient Education  2024 [employer].

## 2023-02-16 NOTE — Telephone Encounter (Signed)
FYI, pt scheduled with another provider.

## 2023-02-16 NOTE — Progress Notes (Signed)
Thomas Li 83 y.o.   Chief Complaint  Patient presents with   Cough    Started Sunday. Cough is coming from chest does have some tightness, no fever, dry throat. Mucus is green. Patient is taking ivermectin it does help     HISTORY OF PRESENT ILLNESS: This is a 83 y.o. male complaining of productive cough and congestion that started about 48 hours ago Denies fever or chills Has a dry throat.  No other associated symptoms No other complaints or medical concerns today.  Cough Pertinent negatives include no chest pain, chills, fever, headaches, rash or sore throat.     Prior to Admission medications   Medication Sig Start Date End Date Taking? Authorizing Provider  acetaminophen (TYLENOL) 500 MG tablet Take 2 tablets (1,000 mg total) by mouth every 6 (six) hours. 11/26/21  Yes Cassandria Anger, PA-C  amLODipine (NORVASC) 5 MG tablet Take 1 tablet (5 mg total) by mouth daily. 06/15/22  Yes Ardith Dark, MD  azelastine (ASTELIN) 0.1 % nasal spray Place 2 sprays into both nostrils 2 (two) times daily. 12/14/22  Yes Ardith Dark, MD  carboxymethylcellulose (REFRESH PLUS) 0.5 % SOLN Place 1 drop into both eyes at bedtime.   Yes [provider]  celecoxib (CELEBREX) 200 MG capsule Take 1 capsule (200 mg total) by mouth 2 (two) times daily. 12/14/22  Yes Ardith Dark, MD  Coenzyme Q10 (COQ-10) 200 MG CAPS Take 200 mg by mouth at bedtime.   Yes [provider]  famotidine (PEPCID) 20 MG tablet Take 1 tablet (20 mg total) by mouth daily. 12/08/22  Yes Ardith Dark, MD  gabapentin (NEURONTIN) 300 MG capsule TAKE 1 CAPSULE BY MOUTH THREE TIMES A DAY 02/08/23  Yes Ardith Dark, MD  latanoprost (XALATAN) 0.005 % ophthalmic solution Place 1 drop into both eyes at bedtime. 04/28/21  Yes [provider]  losartan (COZAAR) 100 MG tablet TAKE 1 TABLET BY MOUTH EVERY DAY 02/01/23  Yes Ardith Dark, MD  mirabegron ER (MYRBETRIQ) 25 MG TB24 tablet Take 25 mg by  mouth daily. 01/16/23  Yes [provider]  Multiple Vitamin (MULTIVITAMIN WITH MINERALS) TABS tablet Take 2 tablets by mouth 2 (two) times daily.   Yes [provider]  rosuvastatin (CRESTOR) 10 MG tablet Take 1 tablet by mouth once daily 02/11/23  Yes Ardith Dark, MD  tolterodine (DETROL) 2 MG tablet Take 2 mg by mouth 2 (two) times daily.   Yes [provider]  VITAMIN D PO Take 1 capsule by mouth daily.   Yes [provider]  Ascorbic Acid (VITAMIN C) POWD Take 1 Dose by mouth daily. Patient not taking: Reported on 02/16/2023    [provider]  azithromycin (ZITHROMAX) 250 MG tablet Take 2 tabs day 1, then 1 tab daily Patient not taking: Reported on 02/16/2023 12/14/22   Ardith Dark, MD  calcium carbonate (TUMS - DOSED IN MG ELEMENTAL CALCIUM) 500 MG chewable tablet Chew 1 tablet by mouth daily as needed for indigestion or heartburn. Patient not taking: Reported on 02/16/2023    [provider]  ondansetron (ZOFRAN) 4 MG tablet Take 1 tablet (4 mg total) by mouth every 6 (six) hours as needed for nausea. Patient not taking: Reported on 02/16/2023 11/26/21   Cassandria Anger, PA-C  polyethylene glycol (MIRALAX / GLYCOLAX) 17 g packet Take 17 g by mouth daily. 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   Pre-diabetes 06/17/2017   Chronic combined systolic and diastolic heart failure (HCC) 07/18/2015   Elevated PSA 09/05/2012   LBBB (left bundle branch block) 07/24/2011   BPH (benign prostatic hyperplasia) 05/09/2010   Hypertension    Hypercholesterolemia     Nocturnal polyuria     Past Medical History:  Diagnosis Date   Abnormal EKG    HX OF LEFT BUNDLE BRANCH BLOCK PER DR [place] NOTE 05-06-16 EPIC   Chronic combined systolic and diastolic heart failure (HCC) 07/18/2015   NO CURRENT CARDIOLOGIST ISSUE IMPROVED   Glaucoma    Hypercholesterolemia    Hypertension    Nocturnal polyuria    Plantar fasciitis    RIGHT FOOT   PONV (postoperative nausea and vomiting)    NEEDS NAUSEA PRE MED   Weak urinary stream     Past Surgical History:  Procedure Laterality Date   ALC REPAIR Left 1985   ACL   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   TEAR DUCT SURGERY  2015   THULIUM LASER TURP (TRANSURETHRAL RESECTION OF PROSTATE) N/A 03/11/2018   Procedure: THULIUM LASER TURP (TRANSURETHRAL RESECTION OF PROSTATE);  Surgeon: Jerilee Field, MD;  Location: [facility];  Service: Urology;  Laterality: N/A;   TOTAL KNEE ARTHROPLASTY Left 11/25/2021   Procedure: TOTAL KNEE ARTHROPLASTY;  Surgeon: Durene Romans, MD;  Location: [facility];  Service: Orthopedics;  Laterality: Left;    Social History   Socioeconomic History   Marital status: Married    Spouse name: Not on file   Number of children: 2   Years of education: Not on file   Highest education level: Not on file  Occupational History   Not on file  Tobacco Use   Smoking status: Former    Current packs/day: 0.00    Average packs/day: 0.5 packs/day for 3.0 years (1.5 ttl pk-yrs)    Types: Cigarettes    Start date: 01/26/1965    Quit date: 01/27/1968    Years since quitting: 55.0   Smokeless tobacco: Never  Vaping Use   Vaping status: Never Used  Substance and Sexual Activity   Alcohol use: Yes    Comment: WINE Q NIGHT   Drug use: No   Sexual activity: Yes    Partners: Female  Other Topics Concern   Not on file  Social History Narrative   2 grands   Social Drivers of Health   Financial Resource Strain: Low Risk  (03/03/2021)   Overall Financial Resource Strain  (CARDIA)    Difficulty of Paying Living Expenses: Not hard at all  Food Insecurity: No Food Insecurity (11/25/2021)   Hunger Vital Sign    Worried About Running Out of Food in the Last Year: Never true    Ran Out of Food in the Last Year: Never true  Transportation Needs: No Transportation Needs (11/25/2021)   PRAPARE - Administrator, Civil Service (Medical): No    Lack of Transportation (Non-Medical): No  Physical Activity: Not on file  Stress: Not on file  Social Connections: Not on file  Intimate Partner Violence: Not At Risk (11/25/2021)   Humiliation, Afraid, Rape,  and Kick questionnaire    Fear of Current or Ex-Partner: No    Emotionally Abused: No    Physically Abused: No    Sexually Abused: No    Family History  Problem Relation Age of Onset   Heart disease Father 44     Review of Systems  Constitutional: Negative.  Negative for chills and fever.  HENT:  Positive for congestion. Negative for sore throat.   Respiratory:  Positive for cough.   Cardiovascular: Negative.  Negative for chest pain and palpitations.  Gastrointestinal:  Negative for abdominal pain, diarrhea, nausea and vomiting.  Genitourinary: Negative.  Negative for dysuria and hematuria.  Skin: Negative.  Negative for rash.  Neurological: Negative.  Negative for dizziness and headaches.  All other systems reviewed and are negative.   Vitals:   02/16/23 0947  BP: 122/62  Pulse: 90  Temp: 98.8 F (37.1 C)    Physical Exam Vitals reviewed.  Constitutional:      Appearance: Normal appearance.  HENT:     Head: Normocephalic.     Mouth/Throat:     Mouth: Mucous membranes are moist.     Pharynx: Oropharynx is clear.  Eyes:     Extraocular Movements: Extraocular movements intact.     Conjunctiva/sclera: Conjunctivae normal.     Pupils: Pupils are equal, round, and reactive to light.  Cardiovascular:     Rate and Rhythm: Normal rate and regular rhythm.     Pulses: Normal pulses.      Heart sounds: Normal heart sounds.  Pulmonary:     Effort: Pulmonary effort is normal.     Breath sounds: Normal breath sounds.  Abdominal:     Palpations: Abdomen is soft.     Tenderness: There is no abdominal tenderness.  Musculoskeletal:     Cervical back: No tenderness.  Lymphadenopathy:     Cervical: No cervical adenopathy.  Skin:    General: Skin is warm and dry.     Capillary Refill: Capillary refill takes less than 2 seconds.  Neurological:     General: No focal deficit present.     Mental Status: He is alert and oriented to person, place, and time.  Psychiatric:        Mood and Affect: Mood normal.        Behavior: Behavior normal.    DG Chest 2 View Result Date: 02/16/2023 CLINICAL DATA:  Cough and congestion. EXAM: CHEST - 2 VIEW COMPARISON:  Chest radiograph dated 03/27/2009. FINDINGS: There is left lung base atelectasis. No focal consolidation, pleural effusion, pneumothorax. The cardiac silhouette is within normal limits. No acute osseous pathology. Degenerative changes of the spine. IMPRESSION: Left lung base atelectasis. No focal consolidation. Electronically Signed   By: Elgie Collard M.D.   On: 02/16/2023 10:34     ASSESSMENT & PLAN: A total of 34 minutes was spent with the patient and counseling/coordination of care regarding preparing for this visit, review of most recent office visit notes, review of chronic medical conditions under management, review of all medications, diagnosis of lower respiratory infection and need for antibiotics, review of chest x-ray report done today, symptom management, prognosis, ED precautions, documentation and need for follow-up if no better or worse during the next several days..  Problem List Items Addressed This Visit       Respiratory   Lower respiratory infection - Primary   Clinically stable.  No red flag signs or symptoms. Afebrile. Chest x-ray done today.  Report reviewed. Recommend to start Augmentin  875 mg twice a  day for 7 days ED precautions given Advised to rest and stay well-hydrated Advised to contact the office if no better or worse during the next several days.      Relevant Medications   amoxicillin-clavulanate (AUGMENTIN) 875-125 MG tablet   Other Relevant Orders   DG Chest 2 View     Other   Acute cough   Cough management discussed. Advised to take over-the-counter Mucinex DM and cough drops Advised to rest and stay well-hydrated Symptom management discussed      Relevant Orders   DG Chest 2 View   Patient Instructions  Stop ivermectin Start Augmentin 875 mg twice a day for 7 days Rest and stay well-hydrated Chest x-ray today Contact the office if no better or worse during the next several days  Acute Bronchitis, Adult  Acute bronchitis is when air tubes in the lungs (bronchi) suddenly get swollen. The condition can make it hard for you to breathe. In adults, acute bronchitis usually goes away within 2 weeks. A cough caused by bronchitis may last up to 3 weeks. Smoking, allergies, and asthma can make the condition worse. What are the causes? Germs that cause cold and flu (viruses). The most common cause of this condition is the virus that causes the common cold. Bacteria. Substances that bother (irritate) the lungs, including: Smoke from cigarettes and other types of tobacco. Dust and pollen. Fumes from chemicals, gases, or burned fuel. Indoor or outdoor air pollution. What increases the risk? A weak body's defense system. This is also called the immune system. Any condition that affects your lungs and breathing, such as asthma. What are the signs or symptoms? A cough. Coughing up clear, yellow, or green mucus. Making high-pitched whistling sounds when you breathe, most often when you breathe out (wheezing). Runny or stuffy nose. Having too much mucus in your lungs (chest congestion). Shortness of breath. Body aches. A sore throat. How is this treated? Acute  bronchitis may go away over time without treatment. Your doctor may tell you to: Drink more fluids. This will help thin your mucus so it is easier to cough up. Use a device that gets medicine into your lungs (inhaler). Use a vaporizer or a humidifier. These are machines that add water to the air. This helps with coughing and poor breathing. Take a medicine that thins mucus and helps clear it from your lungs. Take a medicine that prevents or stops coughing. It is not common to take an antibiotic medicine for this condition. Follow these instructions at home:  Take over-the-counter and prescription medicines only as told by your doctor. Use an inhaler, vaporizer, or humidifier as told by your doctor. Take two teaspoons (10 mL) of honey at bedtime. This helps lessen your coughing at night. Drink enough fluid to keep your pee (urine) pale yellow. Do not smoke or use any products that contain nicotine or tobacco. If you need help quitting, ask your doctor. Get a lot of rest. Return to your normal activities when your doctor says that it is safe. Keep all follow-up visits. How is this prevented?  Wash your hands often with soap and water for at least 20 seconds. If you cannot use soap and water, use hand sanitizer. Avoid contact with people who have cold symptoms. Try not to touch your mouth, nose, or eyes with your hands. Avoid breathing in smoke or chemical fumes. Make sure to get the flu shot every year. Contact a doctor if: Your symptoms do  not get better in 2 weeks. You have trouble coughing up the mucus. Your cough keeps you awake at night. You have a fever. Get help right away if: You cough up blood. You have chest pain. You have very bad shortness of breath. You faint or keep feeling like you are going to faint. You have a very bad headache. Your fever or chills get worse. These symptoms may be an emergency. Get help right away. Call your local emergency services (911 in the  [place] Do not wait to see if the symptoms will go away. Do not drive yourself to the hospital. Summary Acute bronchitis is when air tubes in the lungs (bronchi) suddenly get swollen. In adults, acute bronchitis usually goes away within 2 weeks. Drink more fluids. This will help thin your mucus so it is easier to cough up. Take over-the-counter and prescription medicines only as told by your doctor. Contact a doctor if your symptoms do not improve after 2 weeks of treatment. This information is not intended to replace advice given to you by your health care provider. Make sure you discuss any questions you have with your health care provider. Document Revised: 05/15/2020 Document Reviewed: 05/15/2020 Elsevier Patient Education  2024 Elsevier Inc.    Edwina Barth, MD [facility]

## 2023-02-16 NOTE — Assessment & Plan Note (Signed)
Cough management discussed. Advised to take over-the-counter Mucinex DM and cough drops Advised to rest and stay well-hydrated Symptom management discussed

## 2023-02-16 NOTE — Telephone Encounter (Signed)
  Chief Complaint: cough, congestion Symptoms: productive cough with green sputum, nasal congestion, sinus pressure, occasional sore throat Frequency: since yesterday afternoon Pertinent Negatives: Patient denies fever, headaches, earaches, chest pain Disposition: [] ED /[] Urgent Care (no appt availability in office) / [x] Appointment(In office/virtual)/ []  [facility]/ [] Home Care/ [] Refused Recommended Disposition /[] [facility]/ []  Follow-up with PCP Additional Notes: Patient and wife on phone for triage. Wife states concern that in the past they have treated similar symptoms at home and it has led to pneumonia or serious infections. Patient and wife agreeable to office visit today.   Copied from CRM [id number]. Topic: Clinical - Red Word Triage >> Feb 16, 2023  7:43 AM Adele Barthel wrote: Red Word that prompted transfer to Nurse Triage: Severe cold, started on Sunday morning. Has cough, has moved to his lower chest. Has sinus pressure and nasal congestion. No fever and chest pain, but does have chest tightening. Has history where cold symptoms have progressed and he has been very ill and would like to prevent. Reason for Disposition  [1] MILD difficulty breathing (e.g., minimal/no SOB at rest, SOB with walking, pulse <100) AND [2] still present when not coughing  Answer Assessment - Initial Assessment Questions 1. ONSET: "When did the cough begin?"      Yesterday afternoon.  2. SEVERITY: "How bad is the cough today?"      Mild.  3. SPUTUM: "Describe the color of your sputum" (none, dry cough; clear, white, yellow, green)     Green.  4. HEMOPTYSIS: "Are you coughing up any blood?" If so ask: "How much?" (flecks, streaks, tablespoons, etc.)     Denies.  5. DIFFICULTY BREATHING: "Are you having difficulty breathing?" If Yes, ask: "How bad is it?" (e.g., mild, moderate, severe)    - MILD: No SOB at rest, mild SOB with walking, speaks normally in sentences, can lie down, no  retractions, pulse < 100.    - MODERATE: SOB at rest, SOB with minimal exertion and prefers to sit, cannot lie down flat, speaks in phrases, mild retractions, audible wheezing, pulse 100-120.    - SEVERE: Very SOB at rest, speaks in single words, struggling to breathe, sitting hunched forward, retractions, pulse > 120      "Just a little when sleeping", feeling SOB when sleeping at night. Mild SOB at this time while sitting up "like he isn't taking full deep breaths"  6. FEVER: "Do you have a fever?" If Yes, ask: "What is your temperature, how was it measured, and when did it start?"     Denies.  7. CARDIAC HISTORY: "Do you have any history of heart disease?" (e.g., heart attack, congestive heart failure)      Denies.  8. LUNG HISTORY: "Do you have any history of lung disease?"  (e.g., pulmonary embolus, asthma, emphysema)     Denies.  9. PE RISK FACTORS: "Do you have a history of blood clots?" (or: recent major surgery, recent prolonged travel, bedridden)     Denies.  10. OTHER SYMPTOMS: "Do you have any other symptoms?" (e.g., runny nose, wheezing, chest pain)       Sinus pressure, nasal congestion, runny nose, occasional sore throat  11. TRAVEL: "Have you traveled out of the country in the last month?" (e.g., travel history, exposures)       Denies major travel or exposure.  Protocols used: Cough - Acute Productive-A-AH

## 2023-03-02 DIAGNOSIS — Z85828 Personal history of other malignant neoplasm of skin: Secondary | ICD-10-CM | POA: Diagnosis not present

## 2023-03-02 DIAGNOSIS — Z08 Encounter for follow-up examination after completed treatment for malignant neoplasm: Secondary | ICD-10-CM | POA: Diagnosis not present

## 2023-03-02 DIAGNOSIS — D225 Melanocytic nevi of trunk: Secondary | ICD-10-CM | POA: Diagnosis not present

## 2023-03-02 DIAGNOSIS — L814 Other melanin hyperpigmentation: Secondary | ICD-10-CM | POA: Diagnosis not present

## 2023-03-02 DIAGNOSIS — L570 Actinic keratosis: Secondary | ICD-10-CM | POA: Diagnosis not present

## 2023-03-02 DIAGNOSIS — L821 Other seborrheic keratosis: Secondary | ICD-10-CM | POA: Diagnosis not present

## 2023-03-07 ENCOUNTER — Other Ambulatory Visit: Payer: Self-pay | Admitting: Family Medicine

## 2023-03-11 ENCOUNTER — Other Ambulatory Visit: Payer: Self-pay | Admitting: Family Medicine

## 2023-04-07 ENCOUNTER — Telehealth: Payer: Self-pay

## 2023-04-07 NOTE — Telephone Encounter (Signed)
 Copied from CRM [id number]. Topic: Referral - Request for Referral >> Apr 07, 2023  9:44 AM Truddie Crumble wrote: Did the patient discuss referral with their provider in the last year? No (If No - schedule appointment) (If Yes - send message)  Appointment offered? No  Type of order/referral and detailed reason for visit: [facility]  [phone]  Preference of office, provider, location: [place]  If referral order, have you been seen by this specialty before? No (If Yes, this issue or another issue? When? Where?  Can we respond through MyChart? Yes  According to documented workflow above, patient should have been scheduled for an appointment to discuss with PCP in office. Please contact patient directly to schedule appt with PCP.

## 2023-04-08 ENCOUNTER — Encounter: Payer: Self-pay | Admitting: Family Medicine

## 2023-04-08 ENCOUNTER — Ambulatory Visit (INDEPENDENT_AMBULATORY_CARE_PROVIDER_SITE_OTHER): Admitting: Family Medicine

## 2023-04-08 VITALS — BP 133/68 | HR 72 | Temp 97.2°F | Ht 66.0 in | Wt 180.4 lb

## 2023-04-08 DIAGNOSIS — G629 Polyneuropathy, unspecified: Secondary | ICD-10-CM

## 2023-04-08 DIAGNOSIS — I10 Essential (primary) hypertension: Secondary | ICD-10-CM

## 2023-04-08 DIAGNOSIS — H9190 Unspecified hearing loss, unspecified ear: Secondary | ICD-10-CM | POA: Diagnosis not present

## 2023-04-08 MED ORDER — AZITHROMYCIN 250 MG PO TABS: ORAL_TABLET | ORAL | 0 refills | Status: DC

## 2023-04-08 MED ORDER — GABAPENTIN 300 MG PO CAPS: 300.0000 mg | ORAL_CAPSULE | Freq: Three times a day (TID) | ORAL | 3 refills | Status: AC

## 2023-04-08 NOTE — Patient Instructions (Signed)
 It was very nice to see you today!  We  No follow-ups on file.   Take care, Dr Jimmey Ralph  PLEASE NOTE:  If you had any lab tests, please let us know if you have not heard back within a few days. You may see your results on mychart before we have a chance to review them but we will give you a call once they are reviewed by [place].   If we ordered any referrals today, please let us know if you have not heard from their office within the next week.   If you had any urgent prescriptions sent in today, please check with the pharmacy within an hour of our visit to make sure the prescription was transmitted appropriately.   Please try these tips to maintain a healthy lifestyle:  Eat at least 3 REAL meals and 1-2 snacks per day.  Aim for no more than 5 hours between eating.  If you eat breakfast, please do so within one hour of getting up.   Each meal should contain half fruits/vegetables, one quarter protein, and one quarter carbs (no bigger than a computer mouse)  Cut down on sweet beverages. This includes juice, soda, and sweet tea.   Drink at least 1 glass of water with each meal and aim for at least 8 glasses per day  Exercise at least 150 minutes every week.

## 2023-04-08 NOTE — Assessment & Plan Note (Signed)
 Stable on gabapentin 300 mg 3 times daily.  Will refill today.

## 2023-04-08 NOTE — Assessment & Plan Note (Signed)
 At goal today on amlodipine 5 mg daily and losartan 100 mg daily.

## 2023-04-08 NOTE — Progress Notes (Signed)
   Thomas Li is a 83 y.o. male who presents today for an office visit.  Assessment/Plan:  New/Acute Problems: Sinusitis  No red flags.  Given length of symptoms would be reasonable to start antibiotics at this point.  He did both azithromycin a few months ago.  Will refill this today.  He also has Astelin nasal spray to use at home that he will restart.  Encouraged hydration.  He can continue over-the-counter meds as needed as well.  He will let us know if not proving we can refer to ENT if needed.  Decreased Hearing  Failed hearing screening today.  Will refer to audiology.  EAC clear bilaterally today.  Chronic Problems Addressed Today: Neuropathy Stable on gabapentin 300 mg 3 times daily.  Will refill today.  Hypertension At goal today on amlodipine 5 mg daily and losartan 100 mg daily.     Subjective:  HPI:  See Assessment / plan for status of chronic conditions.  He has been having sinus drainage and congestion for the last several weeks. He has a lot of throat clearing. No fevers or chills. Tried OTC remedies without much improvement.  Occasional cough.  A lot of drainage.  He has noticed decreased hearing for the last several years. He is interested in getting a hearing aid.        Objective:  Physical Exam: BP 133/68   Pulse 72   Temp (!) 97.2 F (36.2 C) (Temporal)   Ht 5\' 6"  (1.676 m)   Wt 180 lb 6.4 oz (81.8 kg)   SpO2 97%   BMI 29.12 kg/m   Gen: No acute distress, resting comfortably HEENT: TMs clear.  OP clear.  His mucosa erythematous and boggy bilaterally. CV: Regular rate and rhythm with no murmurs appreciated Pulm: Normal work of breathing, clear to auscultation bilaterally with no crackles, wheezes, or rhonchi Neuro: Grossly normal, moves all extremities Psych: Normal affect and thought content      Akisha Sturgill M. Jimmey Ralph, MD 04/08/2023 9:35 AM

## 2023-04-22 DIAGNOSIS — N401 Enlarged prostate with lower urinary tract symptoms: Secondary | ICD-10-CM | POA: Diagnosis not present

## 2023-04-22 DIAGNOSIS — R3915 Urgency of urination: Secondary | ICD-10-CM | POA: Diagnosis not present

## 2023-04-22 DIAGNOSIS — R972 Elevated prostate specific antigen [PSA]: Secondary | ICD-10-CM | POA: Diagnosis not present

## 2023-04-22 DIAGNOSIS — R351 Nocturia: Secondary | ICD-10-CM | POA: Diagnosis not present

## 2023-04-23 DIAGNOSIS — H903 Sensorineural hearing loss, bilateral: Secondary | ICD-10-CM | POA: Diagnosis not present

## 2023-05-11 ENCOUNTER — Other Ambulatory Visit: Payer: Self-pay | Admitting: Family Medicine

## 2023-05-21 DIAGNOSIS — R350 Frequency of micturition: Secondary | ICD-10-CM | POA: Diagnosis not present

## 2023-05-21 DIAGNOSIS — R3915 Urgency of urination: Secondary | ICD-10-CM | POA: Diagnosis not present

## 2023-05-28 DIAGNOSIS — R3915 Urgency of urination: Secondary | ICD-10-CM | POA: Diagnosis not present

## 2023-05-28 DIAGNOSIS — R350 Frequency of micturition: Secondary | ICD-10-CM | POA: Diagnosis not present

## 2023-06-01 DIAGNOSIS — L538 Other specified erythematous conditions: Secondary | ICD-10-CM | POA: Diagnosis not present

## 2023-06-01 DIAGNOSIS — L820 Inflamed seborrheic keratosis: Secondary | ICD-10-CM | POA: Diagnosis not present

## 2023-06-01 DIAGNOSIS — D492 Neoplasm of unspecified behavior of bone, soft tissue, and skin: Secondary | ICD-10-CM | POA: Diagnosis not present

## 2023-06-04 DIAGNOSIS — R3915 Urgency of urination: Secondary | ICD-10-CM | POA: Diagnosis not present

## 2023-06-04 DIAGNOSIS — R350 Frequency of micturition: Secondary | ICD-10-CM | POA: Diagnosis not present

## 2023-06-06 ENCOUNTER — Other Ambulatory Visit: Payer: Self-pay | Admitting: Family Medicine

## 2023-06-11 DIAGNOSIS — R3915 Urgency of urination: Secondary | ICD-10-CM | POA: Diagnosis not present

## 2023-06-11 DIAGNOSIS — R350 Frequency of micturition: Secondary | ICD-10-CM | POA: Diagnosis not present

## 2023-06-14 ENCOUNTER — Ambulatory Visit (INDEPENDENT_AMBULATORY_CARE_PROVIDER_SITE_OTHER): Payer: Medicare Other | Admitting: Family Medicine

## 2023-06-14 ENCOUNTER — Encounter: Payer: Self-pay | Admitting: Family Medicine

## 2023-06-14 VITALS — BP 122/65 | HR 73 | Temp 97.7°F | Ht 66.0 in | Wt 178.4 lb

## 2023-06-14 DIAGNOSIS — R7303 Prediabetes: Secondary | ICD-10-CM

## 2023-06-14 DIAGNOSIS — E7800 Pure hypercholesterolemia, unspecified: Secondary | ICD-10-CM | POA: Diagnosis not present

## 2023-06-14 DIAGNOSIS — G629 Polyneuropathy, unspecified: Secondary | ICD-10-CM

## 2023-06-14 DIAGNOSIS — I10 Essential (primary) hypertension: Secondary | ICD-10-CM | POA: Diagnosis not present

## 2023-06-14 NOTE — Assessment & Plan Note (Signed)
Stable on gabapentin 300 mg 3 times daily. 

## 2023-06-14 NOTE — Assessment & Plan Note (Signed)
 He is doing a great job with lifestyle interventions.  Recheck A1c in 6 months.

## 2023-06-14 NOTE — Assessment & Plan Note (Signed)
 He is doing a great job with diet and exercise.  Down about 8 pounds since our visit 6 months ago.  He is on Crestor  10 mg daily and tolerating well.

## 2023-06-14 NOTE — Patient Instructions (Signed)
 It was very nice to see you today!  I am glad that you are doing well!  No changes today.  Keep working on diet and exercise.   Return in about 6 months (around 12/15/2023) for Annual Physical.   Take care, Dr Daneil Dunker  PLEASE NOTE:  If you had any lab tests, please let us  know if you have not heard back within a few days. You may see your results on mychart before we have a chance to review them but we will give you a call once they are reviewed by us .   If we ordered any referrals today, please let us  know if you have not heard from their office within the next week.   If you had any urgent prescriptions sent in today, please check with the pharmacy within an hour of our visit to make sure the prescription was transmitted appropriately.   Please try these tips to maintain a healthy lifestyle:  Eat at least 3 REAL meals and 1-2 snacks per day.  Aim for no more than 5 hours between eating.  If you eat breakfast, please do so within one hour of getting up.   Each meal should contain half fruits/vegetables, one quarter protein, and one quarter carbs (no bigger than a computer mouse)  Cut down on sweet beverages. This includes juice, soda, and sweet tea.   Drink at least 1 glass of water  with each meal and aim for at least 8 glasses per day  Exercise at least 150 minutes every week.

## 2023-06-14 NOTE — Progress Notes (Signed)
   Thomas Li is a 83 y.o. male who presents today for an office visit.  Assessment/Plan:  Chronic Problems Addressed Today: Hypertension Blood pressure at goal today on amlodipine  5 mg daily and losartan  100 mg daily.  Pre-diabetes He is doing a great job with lifestyle interventions.  Recheck A1c in 6 months.  Neuropathy Stable on gabapentin  300 mg 3 times daily.  Hypercholesterolemia He is doing a great job with diet and exercise.  Down about 8 pounds since our visit 6 months ago.  He is on Crestor  10 mg daily and tolerating well.     Subjective:  HPI:  See Assessment / plan for status of chronic conditions. Patient here today for follow up. He is doing well today.  He has been working on diet and exercise.  He is down about 8 pounds over the last 6 months.       Objective:  Physical Exam: BP 122/65   Pulse 73   Temp 97.7 F (36.5 C) (Temporal)   Ht 5\' 6"  (1.676 m)   Wt 178 lb 6.4 oz (80.9 kg)   SpO2 99%   BMI 28.79 kg/m   Wt Readings from Last 3 Encounters:  06/14/23 178 lb 6.4 oz (80.9 kg)  04/08/23 180 lb 6.4 oz (81.8 kg)  02/16/23 189 lb (85.7 kg)  Gen: No acute distress, resting comfortably CV: Regular rate and rhythm with systolic murmur appreciated Pulm: Normal work of breathing, clear to auscultation bilaterally with no crackles, wheezes, or rhonchi Neuro: Grossly normal, moves all extremities Psych: Normal affect and thought content      Joie Hipps M. Daneil Dunker, MD 06/14/2023 7:44 AM

## 2023-06-14 NOTE — Assessment & Plan Note (Signed)
 Blood pressure at goal today on amlodipine  5 mg daily and losartan  100 mg daily.

## 2023-06-18 DIAGNOSIS — R3915 Urgency of urination: Secondary | ICD-10-CM | POA: Diagnosis not present

## 2023-06-18 DIAGNOSIS — R350 Frequency of micturition: Secondary | ICD-10-CM | POA: Diagnosis not present

## 2023-06-25 DIAGNOSIS — R350 Frequency of micturition: Secondary | ICD-10-CM | POA: Diagnosis not present

## 2023-06-25 DIAGNOSIS — R3915 Urgency of urination: Secondary | ICD-10-CM | POA: Diagnosis not present

## 2023-07-02 DIAGNOSIS — R3915 Urgency of urination: Secondary | ICD-10-CM | POA: Diagnosis not present

## 2023-07-02 DIAGNOSIS — R350 Frequency of micturition: Secondary | ICD-10-CM | POA: Diagnosis not present

## 2023-07-06 DIAGNOSIS — Z48817 Encounter for surgical aftercare following surgery on the skin and subcutaneous tissue: Secondary | ICD-10-CM | POA: Diagnosis not present

## 2023-07-06 DIAGNOSIS — L821 Other seborrheic keratosis: Secondary | ICD-10-CM | POA: Diagnosis not present

## 2023-07-09 DIAGNOSIS — R3915 Urgency of urination: Secondary | ICD-10-CM | POA: Diagnosis not present

## 2023-07-09 DIAGNOSIS — R350 Frequency of micturition: Secondary | ICD-10-CM | POA: Diagnosis not present

## 2023-07-16 DIAGNOSIS — R3915 Urgency of urination: Secondary | ICD-10-CM | POA: Diagnosis not present

## 2023-07-16 DIAGNOSIS — R350 Frequency of micturition: Secondary | ICD-10-CM | POA: Diagnosis not present

## 2023-07-23 DIAGNOSIS — R3915 Urgency of urination: Secondary | ICD-10-CM | POA: Diagnosis not present

## 2023-07-23 DIAGNOSIS — R350 Frequency of micturition: Secondary | ICD-10-CM | POA: Diagnosis not present

## 2023-08-06 DIAGNOSIS — R350 Frequency of micturition: Secondary | ICD-10-CM | POA: Diagnosis not present

## 2023-08-06 DIAGNOSIS — R3915 Urgency of urination: Secondary | ICD-10-CM | POA: Diagnosis not present

## 2023-08-12 ENCOUNTER — Other Ambulatory Visit: Payer: Self-pay | Admitting: Family Medicine

## 2023-08-13 ENCOUNTER — Other Ambulatory Visit: Payer: Self-pay | Admitting: Family Medicine

## 2023-08-13 DIAGNOSIS — R3915 Urgency of urination: Secondary | ICD-10-CM | POA: Diagnosis not present

## 2023-08-13 DIAGNOSIS — R350 Frequency of micturition: Secondary | ICD-10-CM | POA: Diagnosis not present

## 2023-08-30 DIAGNOSIS — L814 Other melanin hyperpigmentation: Secondary | ICD-10-CM | POA: Diagnosis not present

## 2023-08-30 DIAGNOSIS — Z85828 Personal history of other malignant neoplasm of skin: Secondary | ICD-10-CM | POA: Diagnosis not present

## 2023-08-30 DIAGNOSIS — L821 Other seborrheic keratosis: Secondary | ICD-10-CM | POA: Diagnosis not present

## 2023-08-30 DIAGNOSIS — D225 Melanocytic nevi of trunk: Secondary | ICD-10-CM | POA: Diagnosis not present

## 2023-08-30 DIAGNOSIS — L570 Actinic keratosis: Secondary | ICD-10-CM | POA: Diagnosis not present

## 2023-08-30 DIAGNOSIS — Z08 Encounter for follow-up examination after completed treatment for malignant neoplasm: Secondary | ICD-10-CM | POA: Diagnosis not present

## 2023-09-10 DIAGNOSIS — R3915 Urgency of urination: Secondary | ICD-10-CM | POA: Diagnosis not present

## 2023-09-10 DIAGNOSIS — R350 Frequency of micturition: Secondary | ICD-10-CM | POA: Diagnosis not present

## 2023-10-08 ENCOUNTER — Observation Stay (HOSPITAL_BASED_OUTPATIENT_CLINIC_OR_DEPARTMENT_OTHER)
Admission: EM | Admit: 2023-10-08 | Discharge: 2023-10-10 | Disposition: A | Discharge status: Home / Self Care | Attending: Internal Medicine | Admitting: Internal Medicine

## 2023-10-08 ENCOUNTER — Encounter (HOSPITAL_BASED_OUTPATIENT_CLINIC_OR_DEPARTMENT_OTHER): Payer: Self-pay

## 2023-10-08 ENCOUNTER — Emergency Department (HOSPITAL_BASED_OUTPATIENT_CLINIC_OR_DEPARTMENT_OTHER)

## 2023-10-08 ENCOUNTER — Ambulatory Visit: Payer: Self-pay

## 2023-10-08 ENCOUNTER — Ambulatory Visit: Admitting: Nurse Practitioner

## 2023-10-08 ENCOUNTER — Other Ambulatory Visit: Payer: Self-pay

## 2023-10-08 DIAGNOSIS — E7800 Pure hypercholesterolemia, unspecified: Secondary | ICD-10-CM | POA: Diagnosis not present

## 2023-10-08 DIAGNOSIS — I110 Hypertensive heart disease with heart failure: Secondary | ICD-10-CM | POA: Insufficient documentation

## 2023-10-08 DIAGNOSIS — K315 Obstruction of duodenum: Secondary | ICD-10-CM | POA: Insufficient documentation

## 2023-10-08 DIAGNOSIS — E785 Hyperlipidemia, unspecified: Secondary | ICD-10-CM | POA: Insufficient documentation

## 2023-10-08 DIAGNOSIS — K219 Gastro-esophageal reflux disease without esophagitis: Secondary | ICD-10-CM | POA: Insufficient documentation

## 2023-10-08 DIAGNOSIS — R7303 Prediabetes: Secondary | ICD-10-CM | POA: Insufficient documentation

## 2023-10-08 DIAGNOSIS — R933 Abnormal findings on diagnostic imaging of other parts of digestive tract: Secondary | ICD-10-CM | POA: Diagnosis not present

## 2023-10-08 DIAGNOSIS — Z79899 Other long term (current) drug therapy: Secondary | ICD-10-CM | POA: Diagnosis not present

## 2023-10-08 DIAGNOSIS — F1090 Alcohol use, unspecified, uncomplicated: Secondary | ICD-10-CM | POA: Insufficient documentation

## 2023-10-08 DIAGNOSIS — N400 Enlarged prostate without lower urinary tract symptoms: Secondary | ICD-10-CM | POA: Diagnosis not present

## 2023-10-08 DIAGNOSIS — I10 Essential (primary) hypertension: Secondary | ICD-10-CM | POA: Diagnosis present

## 2023-10-08 DIAGNOSIS — Z87891 Personal history of nicotine dependence: Secondary | ICD-10-CM | POA: Insufficient documentation

## 2023-10-08 DIAGNOSIS — K921 Melena: Secondary | ICD-10-CM | POA: Insufficient documentation

## 2023-10-08 DIAGNOSIS — R350 Frequency of micturition: Secondary | ICD-10-CM | POA: Diagnosis not present

## 2023-10-08 DIAGNOSIS — N401 Enlarged prostate with lower urinary tract symptoms: Secondary | ICD-10-CM | POA: Insufficient documentation

## 2023-10-08 DIAGNOSIS — R3915 Urgency of urination: Secondary | ICD-10-CM | POA: Diagnosis not present

## 2023-10-08 DIAGNOSIS — K3189 Other diseases of stomach and duodenum: Principal | ICD-10-CM | POA: Insufficient documentation

## 2023-10-08 DIAGNOSIS — K922 Gastrointestinal hemorrhage, unspecified: Principal | ICD-10-CM | POA: Insufficient documentation

## 2023-10-08 DIAGNOSIS — R935 Abnormal findings on diagnostic imaging of other abdominal regions, including retroperitoneum: Secondary | ICD-10-CM | POA: Diagnosis not present

## 2023-10-08 DIAGNOSIS — G609 Hereditary and idiopathic neuropathy, unspecified: Secondary | ICD-10-CM | POA: Insufficient documentation

## 2023-10-08 DIAGNOSIS — I5042 Chronic combined systolic (congestive) and diastolic (congestive) heart failure: Secondary | ICD-10-CM | POA: Diagnosis not present

## 2023-10-08 DIAGNOSIS — R1030 Lower abdominal pain, unspecified: Secondary | ICD-10-CM | POA: Diagnosis present

## 2023-10-08 DIAGNOSIS — G629 Polyneuropathy, unspecified: Secondary | ICD-10-CM | POA: Diagnosis not present

## 2023-10-08 DIAGNOSIS — K2980 Duodenitis without bleeding: Secondary | ICD-10-CM | POA: Diagnosis not present

## 2023-10-08 DIAGNOSIS — R0902 Hypoxemia: Secondary | ICD-10-CM | POA: Diagnosis not present

## 2023-10-08 LAB — CBC
HCT: 41.7 % (ref 39.0–52.0)
Hemoglobin: 14 g/dL (ref 13.0–17.0)
MCH: 34 pg (ref 26.0–34.0)
MCHC: 33.6 g/dL (ref 30.0–36.0)
MCV: 101.2 fL — ABNORMAL HIGH (ref 80.0–100.0)
Platelets: 207 K/uL (ref 150–400)
RBC: 4.12 MIL/uL — ABNORMAL LOW (ref 4.22–5.81)
RDW: 12.7 % (ref 11.5–15.5)
WBC: 6 K/uL (ref 4.0–10.5)
nRBC: 0 % (ref 0.0–0.2)

## 2023-10-08 LAB — COMPREHENSIVE METABOLIC PANEL WITH GFR
ALT: 38 U/L (ref 0–44)
AST: 37 U/L (ref 15–41)
Albumin: 4.5 g/dL (ref 3.5–5.0)
Alkaline Phosphatase: 54 U/L (ref 38–126)
Anion gap: 12 (ref 5–15)
BUN: 11 mg/dL (ref 8–23)
CO2: 25 mmol/L (ref 22–32)
Calcium: 9.6 mg/dL (ref 8.9–10.3)
Chloride: 102 mmol/L (ref 98–111)
Creatinine, Ser: 0.89 mg/dL (ref 0.61–1.24)
GFR, Estimated: >60 mL/min (ref >60)
Glucose, Bld: 123 mg/dL — ABNORMAL HIGH (ref 70–99)
Potassium: 4.4 mmol/L (ref 3.5–5.1)
Sodium: 139 mmol/L (ref 135–145)
Total Bilirubin: 0.4 mg/dL (ref 0.0–1.2)
Total Protein: 6.9 g/dL (ref 6.5–8.1)

## 2023-10-08 LAB — URINALYSIS, ROUTINE W REFLEX MICROSCOPIC
Bilirubin Urine: NEGATIVE
Glucose, UA: NEGATIVE mg/dL
Hgb urine dipstick: NEGATIVE
Ketones, ur: NEGATIVE mg/dL
Leukocytes,Ua: NEGATIVE
Nitrite: NEGATIVE
Protein, ur: NEGATIVE mg/dL
Specific Gravity, Urine: 1.011 (ref 1.005–1.030)
pH: 7.5 (ref 5.0–8.0)

## 2023-10-08 LAB — OCCULT BLOOD X 1 CARD TO LAB, STOOL: Fecal Occult Bld: POSITIVE — AB

## 2023-10-08 LAB — LIPASE, BLOOD: Lipase: 24 U/L (ref 11–51)

## 2023-10-08 MED ORDER — ONDANSETRON HCL 4 MG/2ML IJ SOLN
4.0000 mg | Freq: Once | INTRAMUSCULAR | Status: AC
  Administered 2023-10-08: 4 mg via INTRAVENOUS
  Filled 2023-10-08: qty 2

## 2023-10-08 MED ORDER — LACTATED RINGERS IV SOLN: INTRAVENOUS | Status: AC

## 2023-10-08 MED ORDER — PANTOPRAZOLE SODIUM 40 MG IV SOLR
80.0000 mg | Freq: Once | INTRAVENOUS | Status: AC
  Administered 2023-10-08: 80 mg via INTRAVENOUS
  Filled 2023-10-08: qty 20

## 2023-10-08 MED ORDER — HYDROMORPHONE HCL 1 MG/ML IJ SOLN
0.5000 mg | Freq: Once | INTRAMUSCULAR | Status: AC
  Administered 2023-10-08: 0.5 mg via INTRAVENOUS
  Filled 2023-10-08: qty 1

## 2023-10-08 MED ORDER — ONDANSETRON HCL 4 MG/2ML IJ SOLN: 4.0000 mg | Freq: Four times a day (QID) | INTRAMUSCULAR | Status: DC | PRN

## 2023-10-08 MED ORDER — IOHEXOL 300 MG/ML  SOLN
100.0000 mL | Freq: Once | INTRAMUSCULAR | Status: AC | PRN
  Administered 2023-10-08: 100 mL via INTRAVENOUS

## 2023-10-08 MED ORDER — PANTOPRAZOLE SODIUM 40 MG IV SOLR
40.0000 mg | Freq: Two times a day (BID) | INTRAVENOUS | Status: DC
  Administered 2023-10-08 – 2023-10-10 (×4): 40 mg via INTRAVENOUS
  Filled 2023-10-08 (×5): qty 10

## 2023-10-08 MED ORDER — MORPHINE SULFATE (PF) 2 MG/ML IV SOLN: 1.0000 mg | INTRAVENOUS | Status: DC | PRN

## 2023-10-08 NOTE — Plan of Care (Addendum)
 [facility] emergency department to [facility] medical telemetry bed:  83 year old male past medical history of prediabetes, essential hypertension, hyperlipidemia and peripheral neuropathy presented emergency department complaining about abdominal pain and darker stool. Patient reported since Tuesday he is experiencing lower abdominal pain.escribes it as a gnawing sensation in his lower abdomen.  Typically 7/10 in severity.  Intermittent.  No exacerbating or alleviating factors.  Says that since then has been having multiple loose dark stools.  Says that they are black in nature.  Not on blood thinners.  No Pepto-Bismol or iron use.  No history of GI bleeds.  No heavy alcohol  use or NSAID use.  No fevers or nausea or vomiting.  At presentation to ED patient is hemodynamically stable.  Afebrile. FOBT positive. CBC unremarkable stable H&H normal WBC platelet count.  CMP unremarkable.  Normal lipase level.  CT abdomen pelvis nonspecific finding mentioning below: 1. Wall thickening and prominent mucosal enhancement with surrounding inflammatory change centered over the distal duodenal bulb/second portion of duodenum, question focal stricture or narrowing in this region as well. Findings could be secondary to duodenitis/peptic ulcer disease versus inflammatory benign or malignant duodenal stricture. Recommend correlation with endoscopy. No evidence for perforation on this exam. 2. Small bladder stone. 3. Enlarged prostate. 4. Aortic atherosclerosis.  In the ED patient received Protonix  80 mg, Zofran  4 mg and Dilaudid .   Dr. Jakie spoke with [facility] Dr.Pyrtle recommended admit to [facility] and will see if plan to do EGD or colonoscopy soon. Patient is hemodynamically stable and stable H&H.  At this time patient does not need to go to progressive bed rather medical telemetry which would be appropriate.  Hospitalist has been consulted for further evaluation management of GI  bleed-melena.  Kealii Thueson, MD [facility] 10/08/2023, 10:25 PM

## 2023-10-08 NOTE — ED Triage Notes (Signed)
 Pt c/o abd pain, dark stools. Not quite as bad in the mornings but it comes on pretty quick, stays all day. Onset Tuesday, states he's just tired of it, denies worsening/ change.

## 2023-10-08 NOTE — ED Provider Notes (Signed)
 [place] EMERGENCY DEPARTMENT AT [facility] Provider Note   CSN: [id number] Arrival date & time: 10/08/23  1713     Patient presents with: Abdominal Pain   Thomas Li is a 83 y.o. male.   83 year old male with a history of hypertension and hyperlipidemia who presents emergency department with dark stools and lower abdominal pain.  Since Tuesday has been having lower abdominal pain.  Describes it as a gnawing sensation in his lower abdomen.  Typically 7/10 in severity.  Intermittent.  No exacerbating or alleviating factors.  Says that since then has been having multiple loose dark stools.  Says that they are black in nature.  Not on blood thinners.  No Pepto-Bismol or iron use.  No history of GI bleeds.  No heavy alcohol  use or NSAID use.  No fevers or nausea or vomiting       Prior to Admission medications   Medication Sig Start Date End Date Taking? Authorizing Provider  acetaminophen  (TYLENOL ) 500 MG tablet Take 2 tablets (1,000 mg total) by mouth every 6 (six) hours. 11/26/21   Patti Rosina SAUNDERS, PA-C  amLODipine  (NORVASC ) 5 MG tablet TAKE 1 TABLET (5 MG TOTAL) BY MOUTH DAILY. 06/07/23   Kennyth Worth HERO, MD  Ascorbic Acid (VITAMIN C) POWD Take 1 Dose by mouth daily.    [provider]  carboxymethylcellulose (REFRESH PLUS) 0.5 % SOLN Place 1 drop into both eyes at bedtime.    [provider]  Coenzyme Q10 (COQ-10) 200 MG CAPS Take 200 mg by mouth at bedtime.    [provider]  gabapentin  (NEURONTIN ) 300 MG capsule Take 1 capsule (300 mg total) by mouth 3 (three) times daily. 04/08/23   Kennyth Worth HERO, MD  latanoprost  (XALATAN ) 0.005 % ophthalmic solution Place 1 drop into both eyes at bedtime. 04/28/21   [provider]  losartan  (COZAAR ) 100 MG tablet TAKE 1 TABLET BY MOUTH EVERY DAY 02/01/23   Kennyth Worth HERO, MD  mirabegron ER (MYRBETRIQ) 25 MG TB24 tablet Take 25 mg by mouth daily. 01/16/23   [provider]  Multiple  Vitamin (MULTIVITAMIN WITH MINERALS) TABS tablet Take 2 tablets by mouth 2 (two) times daily.    [provider]  rosuvastatin  (CRESTOR ) 10 MG tablet Take 1 tablet by mouth once daily 08/12/23   Parker, Caleb M, MD  senna (SENOKOT) 8.6 MG TABS tablet Take 1 tablet by mouth.    [provider]  tolterodine (DETROL) 2 MG tablet Take 2 mg by mouth 2 (two) times daily.    [provider]  VITAMIN D PO Take 1 capsule by mouth daily.    [provider]    Allergies: Patient has no known allergies.    Review of Systems  Updated Vital Signs BP 116/62   Pulse 60   Temp 98 F (36.7 C)   Resp 18   SpO2 93%   Physical Exam Vitals and nursing note reviewed.  Constitutional:      General: He is not in acute distress.    Appearance: He is well-developed.  HENT:     Head: Normocephalic and atraumatic.     Right Ear: External ear normal.     Left Ear: External ear normal.     Nose: Nose normal.  Eyes:     Extraocular Movements: Extraocular movements intact.     Conjunctiva/sclera: Conjunctivae normal.     Pupils: Pupils are equal, round, and reactive to light.  Abdominal:  General: There is no distension.     Palpations: Abdomen is soft. There is no mass.     Tenderness: There is abdominal tenderness (Suprapubic bilateral lower quadrant). There is no guarding.  Musculoskeletal:     Cervical back: Normal range of motion and neck supple.  Neurological:     Mental Status: He is alert. Mental status is at baseline.  Psychiatric:        Mood and Affect: Mood normal.        Behavior: Behavior normal.     (all labs ordered are listed, but only abnormal results are displayed) Labs Reviewed  COMPREHENSIVE METABOLIC PANEL WITH GFR - Abnormal; Notable for the following components:      Result Value   Glucose, Bld 123 (*)    All other components within normal limits  CBC - Abnormal; Notable for the following components:   RBC 4.12 (*)    MCV 101.2 (*)     All other components within normal limits  OCCULT BLOOD X 1 CARD TO LAB, STOOL - Abnormal; Notable for the following components:   Fecal Occult Bld POSITIVE (*)    All other components within normal limits  LIPASE, BLOOD  URINALYSIS, ROUTINE W REFLEX MICROSCOPIC  POC OCCULT BLOOD, ED    EKG: None  Radiology: CT ABDOMEN PELVIS W CONTRAST Result Date: 10/08/2023 CLINICAL DATA:  Lower abdominal pain with black stool EXAM: CT ABDOMEN AND PELVIS WITH CONTRAST TECHNIQUE: Multidetector CT imaging of the abdomen and pelvis was performed using the standard protocol following bolus administration of intravenous contrast. RADIATION DOSE REDUCTION: This exam was performed according to the departmental dose-optimization program which includes automated exposure control, adjustment of the mA and/or kV according to patient size and/or use of iterative reconstruction technique. CONTRAST:  OMNIPAQUE  IOHEXOL  300 MG/ML  SOLN COMPARISON:  CT 08/05/2020 FINDINGS: Lower chest: Lung bases demonstrate no acute airspace disease. Hepatobiliary: No focal liver abnormality is seen. No gallstones, gallbladder wall thickening, or biliary dilatation. Pancreas: Unremarkable. No pancreatic ductal dilatation or surrounding inflammatory changes. Spleen: Normal in size without focal abnormality. Adrenals/Urinary Tract: Adrenal glands are normal. Bilateral parapelvic renal cysts for which no imaging follow-up is recommended. The bladder contains a small stone posteriorly. Stomach/Bowel: The stomach is within normal limits. Wall thickening and prominent mucosal enhancement with surrounding inflammatory change, appears centered over the distal duodenal bulb/second portion of duodenum, series 4, image 66, question focal stricture or narrowing in this region as well. No extraluminal gas to suggest a perforation on this exam. Remainder of the small bowel shows no distention or acute inflammation. The appendix is negative  Vascular/Lymphatic: Aortic atherosclerosis. No enlarged abdominal or pelvic lymph nodes. Reproductive: Enlarged prostate Other: Negative for pelvic effusion or free air Musculoskeletal: No acute or suspicious osseous abnormality IMPRESSION: 1. Wall thickening and prominent mucosal enhancement with surrounding inflammatory change centered over the distal duodenal bulb/second portion of duodenum, question focal stricture or narrowing in this region as well. Findings could be secondary to duodenitis/peptic ulcer disease versus inflammatory benign or malignant duodenal stricture. Recommend correlation with endoscopy. No evidence for perforation on this exam. 2. Small bladder stone. 3. Enlarged prostate. 4. Aortic atherosclerosis. Aortic Atherosclerosis (ICD10-I70.0). Electronically Signed   By: Luke Bun M.D.   On: 10/08/2023 21:29     Procedures   Medications Ordered in the ED  pantoprazole  (PROTONIX ) injection 40 mg (has no administration in time range)  lactated ringers  infusion (has no administration in time range)  ondansetron  (ZOFRAN ) injection 4  mg (has no administration in time range)  pantoprazole  (PROTONIX ) injection 80 mg (80 mg Intravenous Given 10/08/23 2042)  HYDROmorphone  (DILAUDID ) injection 0.5 mg (0.5 mg Intravenous Given 10/08/23 2103)  ondansetron  (ZOFRAN ) injection 4 mg (4 mg Intravenous Given 10/08/23 2102)  iohexol  (OMNIPAQUE ) 300 MG/ML solution 100 mL (100 mLs Intravenous Contrast Given 10/08/23 2120)    Clinical Course as of 10/08/23 2242  Fri Oct 08, 2023  2209 Dr Buddie from [facility] will see when admitted. [facility]commends [facility].  [RP]    Clinical Course User Index [RP] Yolande Lamar BROCKS, MD                                 Medical Decision Making Amount and/or Complexity of Data Reviewed Labs: ordered. Radiology: ordered.  Risk Prescription drug management. Decision regarding hospitalization.   83 year old male with a history of hypertension and  hyperlipidemia who presents emergency department with dark stools and lower abdominal pain.    Initial Ddx:  Upper GI bleed, peptic ulcer disease, malignancy, diverticulitis, diverticulosis  MDM/Course:  Patient resents emergency department with melena.  Also is complaining of abdominal pain but interestingly it is lower abdominal pain.  No major risk factors for GI bleeding.  Reports that he does not have a history of this either.  On exam does have lower abdominal tenderness to palpation.  His vital signs are all stable.  CBC shows a hemoglobin of 14.  Hemoccult was positive.  Started on Protonix .  CT scan showed duodenal inflammation or mass.  Discussed with GI who will see the patient and recommends admission to [facility].  Discussed with hospitalist for admission.  Upon re-evaluation remained stable  This patient presents to the ED for concern of complaints listed in HPI, this involves an extensive number of treatment options, and is a complaint that carries with it a high risk of complications and morbidity. Disposition including potential need for admission considered.   Dispo: Admit to Floor  Additional history obtained from spouse Records reviewed Outpatient Clinic Notes The following labs were independently interpreted: CBC and show no acute abnormality I independently reviewed the following imaging with scope of interpretation limited to determining acute life threatening conditions related to emergency care: CT Abdomen/Pelvis and agree with the radiologist interpretation with the following exceptions: none I personally reviewed and interpreted cardiac monitoring: normal sinus rhythm  I personally reviewed and interpreted the pt's EKG: see above for interpretation  I have reviewed the patients home medications and made adjustments as needed Consults: Gastroenterology and Hospitalist Social Determinants of health:  Geriatric  Portions of this note were generated with Herbalist. Dictation errors may occur despite best attempts at proofreading.     Final diagnoses:  Upper GI bleed  Lower abdominal pain    ED Discharge Orders     None          Yolande Lamar BROCKS, MD 10/08/23 2242

## 2023-10-08 NOTE — Telephone Encounter (Signed)
 FYI Only or Action Required?: FYI only for provider.  Patient was last seen in primary care on 06/14/2023 by Kennyth Worth HERO, MD.  Called Nurse Triage reporting Abdominal Pain.  Symptoms began several days ago.  Interventions attempted: Rest, hydration, or home remedies.  Symptoms are: unchanged.  Triage Disposition: Go to ED Now (or PCP Triage)  Patient/caregiver understands and will follow disposition?: Yes  Copied from CRM [id number]. Topic: Clinical - Red Word Triage >> Oct 08, 2023  2:13 PM Burnard DEL wrote: Red Word that prompted transfer to Nurse Triage:intestinal pain ,dark stools >> Oct 08, 2023  4:09 PM Fonda T wrote: Received call form patient regarding abdominal pain, states an appointment was scheduled for today, but patient was not seen. Calling for further directives.  Reason for Disposition  Patient sounds very sick or weak to the triager  Answer Assessment - Initial Assessment Questions Patient was already triaged by this RN. Appointment was made at another location-information was given to patient's wife. Patient called back due to not being able to get into the appointment due to being late. Patient called back with needing further directives. Patient is recommended to be evaluated at the ED due to abdominal pain and dark stools. Patient verbalized understanding and all questions answered.  Protocols used: Abdominal Pain - Male-A-AH

## 2023-10-08 NOTE — Telephone Encounter (Signed)
 FYI Only or Action Required?: FYI only for provider.  Patient was last seen in primary care on 06/14/2023 by Kennyth Worth HERO, MD.  Called Nurse Triage reporting Abdominal Pain.  Symptoms began several days ago.  Interventions attempted: Rest, hydration, or home remedies.  Symptoms are: unchanged.  Triage Disposition: See HCP Within 4 Hours (Or PCP Triage)  Patient/caregiver understands and will follow disposition?: Yes  Copied from CRM #[id number]. Topic: Clinical - Red Word Triage >> Oct 08, 2023  2:13 PM Burnard DEL wrote: Red Word that prompted transfer to Nurse Triage:intestinal pain ,dark stools Reason for Disposition  [1] MILD-MODERATE pain AND [2] constant AND [3] age > 60 years  Answer Assessment - Initial Assessment Questions 1. LOCATION: Where does it hurt?      Lower abdominal pain 2. RADIATION: Does the pain shoot anywhere else? (e.g., chest, back)     No radiation 3. ONSET: When did the pain begin? (Minutes, hours or days ago)      Started Monday 4. SUDDEN: Gradual or sudden onset?     Gradual onset 5. PATTERN Does the pain come and go, or is it constant?     Wife calling for husband and unsure  6. SEVERITY: How bad is the pain?  (e.g., Scale 1-10; mild, moderate, or severe)     Mild-moderate 7. RECURRENT SYMPTOM: Have you ever had this type of stomach pain before? If Yes, ask: When was the last time? and What happened that time?      no 8. CAUSE: What do you think is causing the stomach pain? (e.g., gallstones, recent abdominal surgery)     unsure 9. RELIEVING/AGGRAVATING FACTORS: What makes it better or worse? (e.g., antacids, bending or twisting motion, bowel movement)     Wife is unsure. 10. OTHER SYMPTOMS: Do you have any other symptoms? (e.g., back pain, diarrhea, fever, urination pain, vomiting)       Dark stools-diarrhea,  Protocols used: Abdominal Pain - Male-A-AH

## 2023-10-08 NOTE — ED Notes (Signed)
Patient to CT via wheelchair. NAD at this time.

## 2023-10-09 ENCOUNTER — Inpatient Hospital Stay (HOSPITAL_COMMUNITY)

## 2023-10-09 DIAGNOSIS — K219 Gastro-esophageal reflux disease without esophagitis: Secondary | ICD-10-CM | POA: Diagnosis not present

## 2023-10-09 DIAGNOSIS — K3189 Other diseases of stomach and duodenum: Secondary | ICD-10-CM | POA: Diagnosis not present

## 2023-10-09 DIAGNOSIS — K922 Gastrointestinal hemorrhage, unspecified: Secondary | ICD-10-CM | POA: Diagnosis not present

## 2023-10-09 DIAGNOSIS — E7800 Pure hypercholesterolemia, unspecified: Secondary | ICD-10-CM | POA: Diagnosis not present

## 2023-10-09 DIAGNOSIS — R933 Abnormal findings on diagnostic imaging of other parts of digestive tract: Secondary | ICD-10-CM | POA: Diagnosis not present

## 2023-10-09 DIAGNOSIS — Z87891 Personal history of nicotine dependence: Secondary | ICD-10-CM | POA: Diagnosis not present

## 2023-10-09 DIAGNOSIS — R0902 Hypoxemia: Secondary | ICD-10-CM | POA: Diagnosis not present

## 2023-10-09 DIAGNOSIS — I5042 Chronic combined systolic (congestive) and diastolic (congestive) heart failure: Secondary | ICD-10-CM

## 2023-10-09 DIAGNOSIS — I110 Hypertensive heart disease with heart failure: Secondary | ICD-10-CM | POA: Diagnosis not present

## 2023-10-09 DIAGNOSIS — K921 Melena: Secondary | ICD-10-CM | POA: Diagnosis not present

## 2023-10-09 DIAGNOSIS — I10 Essential (primary) hypertension: Secondary | ICD-10-CM | POA: Diagnosis not present

## 2023-10-09 DIAGNOSIS — R1030 Lower abdominal pain, unspecified: Secondary | ICD-10-CM

## 2023-10-09 DIAGNOSIS — R109 Unspecified abdominal pain: Secondary | ICD-10-CM | POA: Diagnosis not present

## 2023-10-09 DIAGNOSIS — Z79899 Other long term (current) drug therapy: Secondary | ICD-10-CM | POA: Diagnosis not present

## 2023-10-09 DIAGNOSIS — N401 Enlarged prostate with lower urinary tract symptoms: Secondary | ICD-10-CM | POA: Diagnosis not present

## 2023-10-09 DIAGNOSIS — R140 Abdominal distension (gaseous): Secondary | ICD-10-CM | POA: Diagnosis not present

## 2023-10-09 DIAGNOSIS — K315 Obstruction of duodenum: Secondary | ICD-10-CM | POA: Diagnosis not present

## 2023-10-09 DIAGNOSIS — Z743 Need for continuous supervision: Secondary | ICD-10-CM | POA: Diagnosis not present

## 2023-10-09 DIAGNOSIS — F1090 Alcohol use, unspecified, uncomplicated: Secondary | ICD-10-CM | POA: Diagnosis not present

## 2023-10-09 LAB — RESPIRATORY PANEL BY PCR

## 2023-10-09 LAB — D-DIMER, QUANTITATIVE: D-Dimer, Quant: <0.27 ug{FEU}/mL (ref 0.00–0.50)

## 2023-10-09 LAB — CBC
HCT: 41.9 % (ref 39.0–52.0)
Hemoglobin: 13.6 g/dL (ref 13.0–17.0)
MCH: 33.4 pg (ref 26.0–34.0)
MCHC: 32.5 g/dL (ref 30.0–36.0)
MCV: 102.9 fL — ABNORMAL HIGH (ref 80.0–100.0)
Platelets: 202 K/uL (ref 150–400)
RBC: 4.07 MIL/uL — ABNORMAL LOW (ref 4.22–5.81)
RDW: 12.8 % (ref 11.5–15.5)
WBC: 6.1 K/uL (ref 4.0–10.5)
nRBC: 0 % (ref 0.0–0.2)

## 2023-10-09 LAB — BASIC METABOLIC PANEL WITH GFR
Anion gap: 8 (ref 5–15)
BUN: 6 mg/dL — ABNORMAL LOW (ref 8–23)
CO2: 26 mmol/L (ref 22–32)
Calcium: 8.8 mg/dL — ABNORMAL LOW (ref 8.9–10.3)
Chloride: 103 mmol/L (ref 98–111)
Creatinine, Ser: 0.87 mg/dL (ref 0.61–1.24)
GFR, Estimated: >60 mL/min (ref >60)
Glucose, Bld: 105 mg/dL — ABNORMAL HIGH (ref 70–99)
Potassium: 4.1 mmol/L (ref 3.5–5.1)
Sodium: 137 mmol/L (ref 135–145)

## 2023-10-09 LAB — BRAIN NATRIURETIC PEPTIDE: B Natriuretic Peptide: 155.8 pg/mL — ABNORMAL HIGH (ref 0.0–100.0)

## 2023-10-09 MED ORDER — AMLODIPINE BESYLATE 5 MG PO TABS
5.0000 mg | ORAL_TABLET | Freq: Every day | ORAL | Status: DC
Start: 2023-10-09 — End: 2023-10-10
  Administered 2023-10-09: 5 mg via ORAL
  Filled 2023-10-09: qty 1

## 2023-10-09 MED ORDER — SENNOSIDES-DOCUSATE SODIUM 8.6-50 MG PO TABS
1.0000 | ORAL_TABLET | Freq: Two times a day (BID) | ORAL | Status: DC
  Administered 2023-10-09: 1 via ORAL
  Filled 2023-10-09: qty 1

## 2023-10-09 MED ORDER — GABAPENTIN 300 MG PO CAPS
300.0000 mg | ORAL_CAPSULE | Freq: Three times a day (TID) | ORAL | Status: DC
  Administered 2023-10-09: 300 mg via ORAL
  Filled 2023-10-09: qty 1

## 2023-10-09 MED ORDER — HYDRALAZINE HCL 20 MG/ML IJ SOLN: 10.0000 mg | Freq: Four times a day (QID) | INTRAMUSCULAR | Status: DC | PRN

## 2023-10-09 MED ORDER — ROSUVASTATIN CALCIUM 5 MG PO TABS
10.0000 mg | ORAL_TABLET | Freq: Every day | ORAL | Status: DC
  Administered 2023-10-09 – 2023-10-10 (×2): 10 mg via ORAL
  Filled 2023-10-09 (×2): qty 2

## 2023-10-09 MED ORDER — LATANOPROST 0.005 % OP SOLN
1.0000 [drp] | Freq: Every day | OPHTHALMIC | Status: DC
  Administered 2023-10-09: 1 [drp] via OPHTHALMIC
  Filled 2023-10-09 (×2): qty 2.5

## 2023-10-09 MED ORDER — AMLODIPINE BESYLATE 5 MG PO TABS
5.0000 mg | ORAL_TABLET | Freq: Every day | ORAL | Status: DC
Start: 2023-10-09 — End: 2023-10-09
  Filled 2023-10-09: qty 1

## 2023-10-09 NOTE — Consult Note (Signed)
 [facility] Consultation Note  Referring Provider: [facility] Primary Care Physician:  Kennyth Worth HERO, MD Primary Gastroenterologist:  [facility]  Reason for Consultation:  abdominal pain, dark stools  HPI: Thomas Li is a 83 y.o. male 1-2 weeks' upper abdominal pain, nausea, early satiety, weight loss, dark stools (dark brown).  No hematemesis or hematochezia.  No NSAIDs.  No blood thinners.  CT as below with duodenal thickening/inflammation.  No prior endoscopy.   Past Medical History:  Diagnosis Date   Abnormal EKG    HX OF LEFT BUNDLE BRANCH BLOCK PER DR Webster NOTE 05-06-16 EPIC   Chronic combined systolic and diastolic heart failure (HCC) 07/18/2015   NO CURRENT CARDIOLOGIST ISSUE IMPROVED   Glaucoma    Hypercholesterolemia    Hypertension    Nocturnal polyuria    Plantar fasciitis    RIGHT FOOT   PONV (postoperative nausea and vomiting)    NEEDS NAUSEA PRE MED   Weak urinary stream     Past Surgical History:  Procedure Laterality Date   ALC REPAIR Left 1985   ACL   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   TEAR DUCT SURGERY  2015   THULIUM LASER TURP (TRANSURETHRAL RESECTION OF PROSTATE) N/A 03/11/2018   Procedure: THULIUM LASER TURP (TRANSURETHRAL RESECTION OF PROSTATE);  Surgeon: Nieves Cough, MD;  Location: [facility];  Service: Urology;  Laterality: N/A;   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 Authorizing Provider  acetaminophen  (TYLENOL ) 500 MG tablet Take 2 tablets (1,000 mg total) by mouth every 6 (six) hours. 11/26/21  Yes Patti Knee R, PA-C  amLODipine  (NORVASC ) 5 MG tablet TAKE 1 TABLET (5 MG TOTAL) BY MOUTH DAILY. 06/07/23  Yes Kennyth Worth HERO, MD  carboxymethylcellulose (REFRESH  PLUS) 0.5 % SOLN Place 1 drop into both eyes at bedtime.   Yes [provider]  Coenzyme Q10 (COQ-10) 200 MG CAPS Take 200 mg by mouth at bedtime.   Yes [provider]  gabapentin  (NEURONTIN ) 300 MG capsule Take 1 capsule (300 mg total) by mouth 3 (three) times daily. 04/08/23  Yes Kennyth Worth HERO, MD  latanoprost  (XALATAN ) 0.005 % ophthalmic solution Place 1 drop into both eyes at bedtime. 04/28/21  Yes [provider]  losartan  (COZAAR ) 100 MG tablet TAKE 1 TABLET BY MOUTH EVERY DAY 02/01/23  Yes Kennyth Worth HERO, MD  rosuvastatin  (CRESTOR ) 10 MG tablet Take 1 tablet by mouth once daily 08/12/23  Yes Parker, Caleb M, MD  senna (SENOKOT) 8.6 MG TABS tablet Take 1 tablet by mouth.   Yes [provider]  VITAMIN D PO Take 1 capsule by mouth daily.   Yes [provider]  Ascorbic Acid (VITAMIN C) POWD Take 1 Dose by mouth daily.    [provider]  mirabegron ER (MYRBETRIQ) 25 MG TB24 tablet Take 25 mg by mouth daily. 01/16/23   [provider]  Multiple Vitamin (MULTIVITAMIN WITH MINERALS) TABS tablet Take 2 tablets by mouth 2 (two) times daily.    [provider]  tolterodine (DETROL) 2 MG tablet Take 2 mg by mouth 2 (two) times daily.    [provider]    Current Facility-Administered Medications  Medication Dose Route Frequency Provider Last Rate Last Admin  hydrALAZINE  (APRESOLINE ) injection 10 mg  10 mg Intravenous Q6H PRN Rai, Nydia POUR, MD       lactated ringers  infusion   Intravenous Continuous Debby Camila LABOR, MD 75 mL/hr at 10/09/23 0635 Rate Change at 10/09/23 9364   latanoprost  (XALATAN ) 0.005 % ophthalmic solution 1 drop  1 drop Both Eyes QHS Rai, Ripudeep K, MD       morphine  (PF) 2 MG/ML injection 1 mg  1 mg Intravenous Q3H PRN Sundil, Subrina, MD       ondansetron  (ZOFRAN ) injection 4 mg  4 mg Intravenous Q6H PRN Sundil, Subrina, MD       pantoprazole  (PROTONIX ) injection 40 mg  40 mg Intravenous  Q12H Sundil, Subrina, MD   40 mg at 10/09/23 0857    Allergies as of 10/08/2023   (No Known Allergies)    Family History  Problem Relation Age of Onset   Heart disease Father 82    Social History   Socioeconomic History   Marital status: Married    Spouse name: Not on file   Number of children: 2   Years of education: Not on file   Highest education level: Associate degree: occupational, Scientist, product/process development, or vocational program  Occupational History   Not on file  Tobacco Use   Smoking status: Former    Current packs/day: 0.00    Average packs/day: 0.5 packs/day for 3.0 years (1.5 ttl pk-yrs)    Types: Cigarettes    Start date: 01/26/1965    Quit date: 01/27/1968    Years since quitting: 55.7   Smokeless tobacco: Never  Vaping Use   Vaping status: Never Used  Substance and Sexual Activity   Alcohol  use: Yes    Comment: WINE Q NIGHT   Drug use: No   Sexual activity: Yes    Partners: Female  Other Topics Concern   Not on file  Social History Narrative   2 grands   Social Drivers of Health   Financial Resource Strain: Low Risk  (06/13/2023)   Overall Financial Resource Strain (CARDIA)    Difficulty of Paying Living Expenses: Not hard at all  Food Insecurity: No Food Insecurity (10/09/2023)   Hunger Vital Sign    Worried About Running Out of Food in the Last Year: Never true    Ran Out of Food in the Last Year: Never true  Transportation Needs: No Transportation Needs (10/09/2023)   PRAPARE - Administrator, Civil Service (Medical): No    Lack of Transportation (Non-Medical): No  Physical Activity: Insufficiently Active (06/13/2023)   Exercise Vital Sign    Days of Exercise per Week: 4 days    Minutes of Exercise per Session: 20 min  Stress: No Stress Concern Present (06/13/2023)   [employer] of Occupational Health - Occupational Stress Questionnaire    Feeling of Stress : Not at all  Social Connections: Socially Integrated (10/09/2023)   Social Connection  and Isolation Panel    Frequency of Communication with Friends and Family: More than three times a week    Frequency of Social Gatherings with Friends and Family: Once a week    Attends Religious Services: More than 4 times per year    Active Member of [employer] or Organizations: No    Attends Banker Meetings: 1 to 4 times per year    Marital Status: Married  Catering manager Violence: Not At Risk (10/09/2023)   Humiliation, Afraid, Rape, and Kick questionnaire    Fear of Current or  Ex-Partner: No    Emotionally Abused: No    Physically Abused: No    Sexually Abused: No    Review of Systems: As per HPI, all others negative.  Physical Exam: Vital signs in last 24 hours: Temp:  [97.6 F (36.4 C)-98.4 F (36.9 C)] 97.9 F (36.6 C) (09/13 1210) Pulse Rate:  [56-77] 56 (09/13 1210) Resp:  [14-19] 18 (09/13 1210) BP: (116-151)/(49-75) 118/59 (09/13 1210) SpO2:  [86 %-98 %] 95 % (09/13 0354) Weight:  [78.2 kg] 78.2 kg (09/13 0422) Last BM Date : 10/08/23 General:   Alert,  Well-developed, well-nourished, pleasant and cooperative in NAD Head:  Normocephalic and atraumatic. Eyes:  Sclera clear, no icterus.   Conjunctiva pink. Ears:  Normal auditory acuity. Nose:  No deformity, discharge,  or lesions. Mouth:  No deformity or lesions.  Oropharynx pink & moist. Neck:  Supple; no masses or thyromegaly. Lungs:  No visible respiratory distress Abdomen:  Soft, non-tender, non distended, No masses, hepatosplenomegaly or hernias noted. No guarding, without rebound.     Msk:  Symmetrical without gross deformities. Normal posture. Pulses:  Normal pulses noted. Extremities:  Without clubbing or edema. Neurologic:  Alert and  oriented x4;  grossly normal neurologically. Skin:  Intact without significant lesions or rashes. Psych:  Alert and cooperative. Normal mood and affect.   Lab Results: Recent Labs    10/08/23 1737 10/09/23 0921  WBC 6.0 6.1  HGB 14.0 13.6  HCT 41.7 41.9   PLT 207 202   BMET Recent Labs    10/08/23 1737 10/09/23 0921  NA 139 137  K 4.4 4.1  CL 102 103  CO2 25 26  GLUCOSE 123* 105*  BUN 11 6*  CREATININE 0.89 0.87  CALCIUM  9.6 8.8*   LFT Recent Labs    10/08/23 1737  PROT 6.9  ALBUMIN 4.5  AST 37  ALT 38  ALKPHOS 54  BILITOT 0.4   PT/INR No results for input(s): LABPROT, INR in the last 72 hours.  Studies/Results: DG CHEST PORT 1 VIEW Result Date: 10/09/2023 CLINICAL DATA:  83 year old male admitted for abdominal pain with black stools. Possible peptic ulcer disease. Hypoxemia. EXAM: PORTABLE CHEST 1 VIEW COMPARISON:  CT Abdomen and Pelvis yesterday. Portable chest 02/16/2023 and earlier. FINDINGS: Portable AP semi upright view at 0644 hours. Lung volumes and mediastinal contours are within normal limits. Negative lung bases on CT yesterday. When allowing for portable technique the lungs are clear. Visualized tracheal air column is within normal limits. No pneumothorax or pleural effusion. No acute osseous abnormality identified. Paucity of bowel gas in the visible abdomen. IMPRESSION: Negative portable chest. Electronically Signed   By: VEAR Hurst M.D.   On: 10/09/2023 07:16   CT ABDOMEN PELVIS W CONTRAST Result Date: 10/08/2023 CLINICAL DATA:  Lower abdominal pain with black stool EXAM: CT ABDOMEN AND PELVIS WITH CONTRAST TECHNIQUE: Multidetector CT imaging of the abdomen and pelvis was performed using the standard protocol following bolus administration of intravenous contrast. RADIATION DOSE REDUCTION: This exam was performed according to the departmental dose-optimization program which includes automated exposure control, adjustment of the mA and/or kV according to patient size and/or use of iterative reconstruction technique. CONTRAST:  OMNIPAQUE  IOHEXOL  300 MG/ML  SOLN COMPARISON:  CT 08/05/2020 FINDINGS: Lower chest: Lung bases demonstrate no acute airspace disease. Hepatobiliary: No focal liver abnormality is seen.  No gallstones, gallbladder wall thickening, or biliary dilatation. Pancreas: Unremarkable. No pancreatic ductal dilatation or surrounding inflammatory changes. Spleen: Normal in size without focal  abnormality. Adrenals/Urinary Tract: Adrenal glands are normal. Bilateral parapelvic renal cysts for which no imaging follow-up is recommended. The bladder contains a small stone posteriorly. Stomach/Bowel: The stomach is within normal limits. Wall thickening and prominent mucosal enhancement with surrounding inflammatory change, appears centered over the distal duodenal bulb/second portion of duodenum, series 4, image 66, question focal stricture or narrowing in this region as well. No extraluminal gas to suggest a perforation on this exam. Remainder of the small bowel shows no distention or acute inflammation. The appendix is negative Vascular/Lymphatic: Aortic atherosclerosis. No enlarged abdominal or pelvic lymph nodes. Reproductive: Enlarged prostate Other: Negative for pelvic effusion or free air Musculoskeletal: No acute or suspicious osseous abnormality IMPRESSION: 1. Wall thickening and prominent mucosal enhancement with surrounding inflammatory change centered over the distal duodenal bulb/second portion of duodenum, question focal stricture or narrowing in this region as well. Findings could be secondary to duodenitis/peptic ulcer disease versus inflammatory benign or malignant duodenal stricture. Recommend correlation with endoscopy. No evidence for perforation on this exam. 2. Small bladder stone. 3. Enlarged prostate. 4. Aortic atherosclerosis. Aortic Atherosclerosis (ICD10-I70.0). Electronically Signed   By: Luke Bun M.D.   On: 10/08/2023 21:29    Impression:   Post prandial abdominal pain. Dark stools. Abnormal CT, thickening proximal duodenum.  Plan:   Soft diet, NPO after midnight. PPI. Endoscopy tomorrow. Risks (bleeding, infection, bowel perforation that could require surgery,  sedation-related changes in cardiopulmonary systems), benefits (identification and possible treatment of source of symptoms, exclusion of certain causes of symptoms), and alternatives (watchful waiting, radiographic imaging studies, empiric medical treatment) of upper endoscopy (EGD) were explained to patient/family in detail and patient wishes to proceed.    LOS: 0 days   Naseem Adler M  10/09/2023, 1:15 PM  Cell [phone] If no answer or after 5 PM call [phone]

## 2023-10-09 NOTE — Plan of Care (Signed)

## 2023-10-09 NOTE — ED Notes (Signed)
 Pt sitting in bed with hypoxic episode. No increased WOB noted and no shortness of breath reported but a normal pulse ox waveform noted. 2L of oxygen placed on patient at this time.

## 2023-10-09 NOTE — Progress Notes (Signed)
 [facility]                                                                              Thomas Li, is a 83 y.o. male, DOB - Jul 06, 1940, FMW:[id number] Admit date - 10/08/2023    Outpatient Primary MD for the patient is Kennyth, Worth HERO, MD  LOS - 0  days  Chief Complaint  Patient presents with   Abdominal Pain       Brief summary   Patient is a 83 year old male with prediabetes, HTN, HLP,  peripheral neuropathy presented to ED with abdominal pain that started 1 day before and associated with dark stools.  No use of blood thinners or NSAIDs. In ED, CT scan showed inflammatory changes at the distal duodenum/second portion question focal stricture or narrowing in this region could be due to duodenitis/PUD/inflammatory, benign or malignant duodenal stricture, recommended endoscopy  Assessment & Plan    Principal Problem: Upper GI bleed with abdominal pain -CT abdomen with duodenitis, narrowing at the distal duodenum - Continue IV PPI, currently n.p.o., GI consulted, awaiting endoscopy -Discussed with GI, Dr. Dolan to see  Active Problems:   Hypertension -N.p.o., continue IV hydralazine  as needed with parameters - Hold losartan , amlodipine     Hypercholesterolemia -N.p.o. will resume Crestor  once started back on diet    BPH (benign prostatic hyperplasia)   Chronic combined systolic and diastolic heart failure (HCC) -Currently stable, euvolemic   Estimated body mass index is 26.21 kg/m as calculated from the following:   Height as of this encounter: 5' 8 (1.727 m).   Weight as of this encounter: 78.2 kg.  Code Status: Full code DVT Prophylaxis:  Place and maintain sequential compression device Start: 10/09/23 0955   Level of Care: Level of care: Telemetry Medical Family Communication: Updated patient's wife at the bedside Disposition Plan:      Remains inpatient appropriate: Pending GI evaluation   Procedures:    Consultants:    GI  Antimicrobials:   Anti-infectives (From admission, onward)    None          Medications  pantoprazole  (PROTONIX ) IV  40 mg Intravenous Q12H      Subjective:   [name] was seen and examined today.  No acute complaints, no active nausea vomiting, abdominal pain.  Patient denies dizziness, chest pain, shortness of breath. No acute events overnight.    Objective:   Vitals:   10/09/23 0255 10/09/23 0354 10/09/23 0422 10/09/23 0756  BP:  131/75 131/75 (!) 147/64  Pulse: (!) 59 61 61 (!) 57  Resp: 14 16 16 18   Temp:  97.6 F (36.4 C) 97.6 F (36.4 C) 98.2 F (36.8 C)  TempSrc:  Oral Oral   SpO2: 96% 95%    Weight:   78.2 kg   Height:   5' 8 (1.727 m)     Intake/Output Summary (Last 24 hours) at 10/09/2023 1015 Last data filed at 10/09/2023 0413 Gross per 24 hour  Intake 500 ml  Output 250 ml  Net 250 ml     Wt Readings from Last 3 Encounters:  10/09/23 78.2 kg  06/14/23 80.9 kg  04/08/23  81.8 kg     Exam General: Alert and oriented x 3, NAD Cardiovascular: S1 S2 auscultated,  RRR Respiratory: Clear to auscultation bilaterally, no wheezing Gastrointestinal: Soft, nontender, nondistended, + bowel sounds Ext: no pedal edema bilaterally Neuro: No new deficits Skin: No rashes Psych: Normal affect     Data Reviewed:  I have personally reviewed following labs    CBC Lab Results  Component Value Date   WBC 6.1 10/09/2023   RBC 4.07 (L) 10/09/2023   HGB 13.6 10/09/2023   HCT 41.9 10/09/2023   MCV 102.9 (H) 10/09/2023   MCH 33.4 10/09/2023   PLT 202 10/09/2023   MCHC 32.5 10/09/2023   RDW 12.8 10/09/2023   LYMPHSABS 1.1 07/16/2021   MONOABS 0.5 07/16/2021   EOSABS 0.1 07/16/2021   BASOSABS 0.0 07/16/2021     Last metabolic panel Lab Results  Component Value Date   NA 139 10/08/2023   K 4.4 10/08/2023   CL 102 10/08/2023   CO2 25 10/08/2023   BUN 11 10/08/2023   CREATININE 0.89 10/08/2023   GLUCOSE 123 (H) 10/08/2023    GFRNONAA >60 10/08/2023   CALCIUM  9.6 10/08/2023   PROT 6.9 10/08/2023   ALBUMIN 4.5 10/08/2023   BILITOT 0.4 10/08/2023   ALKPHOS 54 10/08/2023   AST 37 10/08/2023   ALT 38 10/08/2023   ANIONGAP 12 10/08/2023    CBG (last 3)  No results for input(s): GLUCAP in the last 72 hours.    Coagulation Profile: No results for input(s): INR, PROTIME in the last 168 hours.   Radiology Studies: I have personally reviewed the imaging studies  DG CHEST PORT 1 VIEW Result Date: 10/09/2023 CLINICAL DATA:  83 year old male admitted for abdominal pain with black stools. Possible peptic ulcer disease. Hypoxemia. EXAM: PORTABLE CHEST 1 VIEW COMPARISON:  CT Abdomen and Pelvis yesterday. Portable chest 02/16/2023 and earlier. FINDINGS: Portable AP semi upright view at 0644 hours. Lung volumes and mediastinal contours are within normal limits. Negative lung bases on CT yesterday. When allowing for portable technique the lungs are clear. Visualized tracheal air column is within normal limits. No pneumothorax or pleural effusion. No acute osseous abnormality identified. Paucity of bowel gas in the visible abdomen. IMPRESSION: Negative portable chest. Electronically Signed   By: VEAR Hurst M.D.   On: 10/09/2023 07:16   CT ABDOMEN PELVIS W CONTRAST Result Date: 10/08/2023 CLINICAL DATA:  Lower abdominal pain with black stool EXAM: CT ABDOMEN AND PELVIS WITH CONTRAST TECHNIQUE: Multidetector CT imaging of the abdomen and pelvis was performed using the standard protocol following bolus administration of intravenous contrast. RADIATION DOSE REDUCTION: This exam was performed according to the departmental dose-optimization program which includes automated exposure control, adjustment of the mA and/or kV according to patient size and/or use of iterative reconstruction technique. CONTRAST:  OMNIPAQUE  IOHEXOL  300 MG/ML  SOLN COMPARISON:  CT 08/05/2020 FINDINGS: Lower chest: Lung bases demonstrate no acute airspace  disease. Hepatobiliary: No focal liver abnormality is seen. No gallstones, gallbladder wall thickening, or biliary dilatation. Pancreas: Unremarkable. No pancreatic ductal dilatation or surrounding inflammatory changes. Spleen: Normal in size without focal abnormality. Adrenals/Urinary Tract: Adrenal glands are normal. Bilateral parapelvic renal cysts for which no imaging follow-up is recommended. The bladder contains a small stone posteriorly. Stomach/Bowel: The stomach is within normal limits. Wall thickening and prominent mucosal enhancement with surrounding inflammatory change, appears centered over the distal duodenal bulb/second portion of duodenum, series 4, image 66, question focal stricture or narrowing in this region as well.  No extraluminal gas to suggest a perforation on this exam. Remainder of the small bowel shows no distention or acute inflammation. The appendix is negative Vascular/Lymphatic: Aortic atherosclerosis. No enlarged abdominal or pelvic lymph nodes. Reproductive: Enlarged prostate Other: Negative for pelvic effusion or free air Musculoskeletal: No acute or suspicious osseous abnormality IMPRESSION: 1. Wall thickening and prominent mucosal enhancement with surrounding inflammatory change centered over the distal duodenal bulb/second portion of duodenum, question focal stricture or narrowing in this region as well. Findings could be secondary to duodenitis/peptic ulcer disease versus inflammatory benign or malignant duodenal stricture. Recommend correlation with endoscopy. No evidence for perforation on this exam. 2. Small bladder stone. 3. Enlarged prostate. 4. Aortic atherosclerosis. Aortic Atherosclerosis (ICD10-I70.0). Electronically Signed   By: Luke Bun M.D.   On: 10/08/2023 21:29       Aalyah Mansouri M.D. [facility] 10/09/2023, 10:15 AM  Available via Epic secure chat 7am-7pm After 7 pm, please refer to night coverage provider listed on amion.

## 2023-10-09 NOTE — H&P (View-Only) (Signed)
 [facility] Consultation Note  Referring Provider: [facility] Primary Care Physician:  Kennyth Worth HERO, MD Primary Gastroenterologist:  [facility]  Reason for Consultation:  abdominal pain, dark stools  HPI: Thomas Li is a 83 y.o. male 1-2 weeks' upper abdominal pain, nausea, early satiety, weight loss, dark stools (dark brown).  No hematemesis or hematochezia.  No NSAIDs.  No blood thinners.  CT as below with duodenal thickening/inflammation.  No prior endoscopy.   Past Medical History:  Diagnosis Date   Abnormal EKG    HX OF LEFT BUNDLE BRANCH BLOCK PER DR Webster NOTE 05-06-16 EPIC   Chronic combined systolic and diastolic heart failure (HCC) 07/18/2015   NO CURRENT CARDIOLOGIST ISSUE IMPROVED   Glaucoma    Hypercholesterolemia    Hypertension    Nocturnal polyuria    Plantar fasciitis    RIGHT FOOT   PONV (postoperative nausea and vomiting)    NEEDS NAUSEA PRE MED   Weak urinary stream     Past Surgical History:  Procedure Laterality Date   ALC REPAIR Left 1985   ACL   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   TEAR DUCT SURGERY  2015   THULIUM LASER TURP (TRANSURETHRAL RESECTION OF PROSTATE) N/A 03/11/2018   Procedure: THULIUM LASER TURP (TRANSURETHRAL RESECTION OF PROSTATE);  Surgeon: Nieves Cough, MD;  Location: [facility];  Service: Urology;  Laterality: N/A;   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 Authorizing Provider  acetaminophen  (TYLENOL ) 500 MG tablet Take 2 tablets (1,000 mg total) by mouth every 6 (six) hours. 11/26/21  Yes Patti Knee R, PA-C  amLODipine  (NORVASC ) 5 MG tablet TAKE 1 TABLET (5 MG TOTAL) BY MOUTH DAILY. 06/07/23  Yes Kennyth Worth HERO, MD  carboxymethylcellulose (REFRESH  PLUS) 0.5 % SOLN Place 1 drop into both eyes at bedtime.   Yes [provider]  Coenzyme Q10 (COQ-10) 200 MG CAPS Take 200 mg by mouth at bedtime.   Yes [provider]  gabapentin  (NEURONTIN ) 300 MG capsule Take 1 capsule (300 mg total) by mouth 3 (three) times daily. 04/08/23  Yes Kennyth Worth HERO, MD  latanoprost  (XALATAN ) 0.005 % ophthalmic solution Place 1 drop into both eyes at bedtime. 04/28/21  Yes [provider]  losartan  (COZAAR ) 100 MG tablet TAKE 1 TABLET BY MOUTH EVERY DAY 02/01/23  Yes Kennyth Worth HERO, MD  rosuvastatin  (CRESTOR ) 10 MG tablet Take 1 tablet by mouth once daily 08/12/23  Yes Parker, Caleb M, MD  senna (SENOKOT) 8.6 MG TABS tablet Take 1 tablet by mouth.   Yes [provider]  VITAMIN D PO Take 1 capsule by mouth daily.   Yes [provider]  Ascorbic Acid (VITAMIN C) POWD Take 1 Dose by mouth daily.    [provider]  mirabegron ER (MYRBETRIQ) 25 MG TB24 tablet Take 25 mg by mouth daily. 01/16/23   [provider]  Multiple Vitamin (MULTIVITAMIN WITH MINERALS) TABS tablet Take 2 tablets by mouth 2 (two) times daily.    [provider]  tolterodine (DETROL) 2 MG tablet Take 2 mg by mouth 2 (two) times daily.    [provider]    Current Facility-Administered Medications  Medication Dose Route Frequency Provider Last Rate Last Admin  hydrALAZINE  (APRESOLINE ) injection 10 mg  10 mg Intravenous Q6H PRN Rai, Nydia POUR, MD       lactated ringers  infusion   Intravenous Continuous Debby Camila LABOR, MD 75 mL/hr at 10/09/23 0635 Rate Change at 10/09/23 9364   latanoprost  (XALATAN ) 0.005 % ophthalmic solution 1 drop  1 drop Both Eyes QHS Rai, Ripudeep K, MD       morphine  (PF) 2 MG/ML injection 1 mg  1 mg Intravenous Q3H PRN Sundil, Subrina, MD       ondansetron  (ZOFRAN ) injection 4 mg  4 mg Intravenous Q6H PRN Sundil, Subrina, MD       pantoprazole  (PROTONIX ) injection 40 mg  40 mg Intravenous  Q12H Sundil, Subrina, MD   40 mg at 10/09/23 0857    Allergies as of 10/08/2023   (No Known Allergies)    Family History  Problem Relation Age of Onset   Heart disease Father 82    Social History   Socioeconomic History   Marital status: Married    Spouse name: Not on file   Number of children: 2   Years of education: Not on file   Highest education level: Associate degree: occupational, Scientist, product/process development, or vocational program  Occupational History   Not on file  Tobacco Use   Smoking status: Former    Current packs/day: 0.00    Average packs/day: 0.5 packs/day for 3.0 years (1.5 ttl pk-yrs)    Types: Cigarettes    Start date: 01/26/1965    Quit date: 01/27/1968    Years since quitting: 55.7   Smokeless tobacco: Never  Vaping Use   Vaping status: Never Used  Substance and Sexual Activity   Alcohol  use: Yes    Comment: WINE Q NIGHT   Drug use: No   Sexual activity: Yes    Partners: Female  Other Topics Concern   Not on file  Social History Narrative   2 grands   Social Drivers of Health   Financial Resource Strain: Low Risk  (06/13/2023)   Overall Financial Resource Strain (CARDIA)    Difficulty of Paying Living Expenses: Not hard at all  Food Insecurity: No Food Insecurity (10/09/2023)   Hunger Vital Sign    Worried About Running Out of Food in the Last Year: Never true    Ran Out of Food in the Last Year: Never true  Transportation Needs: No Transportation Needs (10/09/2023)   PRAPARE - Administrator, Civil Service (Medical): No    Lack of Transportation (Non-Medical): No  Physical Activity: Insufficiently Active (06/13/2023)   Exercise Vital Sign    Days of Exercise per Week: 4 days    Minutes of Exercise per Session: 20 min  Stress: No Stress Concern Present (06/13/2023)   [employer] of Occupational Health - Occupational Stress Questionnaire    Feeling of Stress : Not at all  Social Connections: Socially Integrated (10/09/2023)   Social Connection  and Isolation Panel    Frequency of Communication with Friends and Family: More than three times a week    Frequency of Social Gatherings with Friends and Family: Once a week    Attends Religious Services: More than 4 times per year    Active Member of [employer] or Organizations: No    Attends Banker Meetings: 1 to 4 times per year    Marital Status: Married  Catering manager Violence: Not At Risk (10/09/2023)   Humiliation, Afraid, Rape, and Kick questionnaire    Fear of Current or  Ex-Partner: No    Emotionally Abused: No    Physically Abused: No    Sexually Abused: No    Review of Systems: As per HPI, all others negative.  Physical Exam: Vital signs in last 24 hours: Temp:  [97.6 F (36.4 C)-98.4 F (36.9 C)] 97.9 F (36.6 C) (09/13 1210) Pulse Rate:  [56-77] 56 (09/13 1210) Resp:  [14-19] 18 (09/13 1210) BP: (116-151)/(49-75) 118/59 (09/13 1210) SpO2:  [86 %-98 %] 95 % (09/13 0354) Weight:  [78.2 kg] 78.2 kg (09/13 0422) Last BM Date : 10/08/23 General:   Alert,  Well-developed, well-nourished, pleasant and cooperative in NAD Head:  Normocephalic and atraumatic. Eyes:  Sclera clear, no icterus.   Conjunctiva pink. Ears:  Normal auditory acuity. Nose:  No deformity, discharge,  or lesions. Mouth:  No deformity or lesions.  Oropharynx pink & moist. Neck:  Supple; no masses or thyromegaly. Lungs:  No visible respiratory distress Abdomen:  Soft, non-tender, non distended, No masses, hepatosplenomegaly or hernias noted. No guarding, without rebound.     Msk:  Symmetrical without gross deformities. Normal posture. Pulses:  Normal pulses noted. Extremities:  Without clubbing or edema. Neurologic:  Alert and  oriented x4;  grossly normal neurologically. Skin:  Intact without significant lesions or rashes. Psych:  Alert and cooperative. Normal mood and affect.   Lab Results: Recent Labs    10/08/23 1737 10/09/23 0921  WBC 6.0 6.1  HGB 14.0 13.6  HCT 41.7 41.9   PLT 207 202   BMET Recent Labs    10/08/23 1737 10/09/23 0921  NA 139 137  K 4.4 4.1  CL 102 103  CO2 25 26  GLUCOSE 123* 105*  BUN 11 6*  CREATININE 0.89 0.87  CALCIUM  9.6 8.8*   LFT Recent Labs    10/08/23 1737  PROT 6.9  ALBUMIN 4.5  AST 37  ALT 38  ALKPHOS 54  BILITOT 0.4   PT/INR No results for input(s): LABPROT, INR in the last 72 hours.  Studies/Results: DG CHEST PORT 1 VIEW Result Date: 10/09/2023 CLINICAL DATA:  83 year old male admitted for abdominal pain with black stools. Possible peptic ulcer disease. Hypoxemia. EXAM: PORTABLE CHEST 1 VIEW COMPARISON:  CT Abdomen and Pelvis yesterday. Portable chest 02/16/2023 and earlier. FINDINGS: Portable AP semi upright view at 0644 hours. Lung volumes and mediastinal contours are within normal limits. Negative lung bases on CT yesterday. When allowing for portable technique the lungs are clear. Visualized tracheal air column is within normal limits. No pneumothorax or pleural effusion. No acute osseous abnormality identified. Paucity of bowel gas in the visible abdomen. IMPRESSION: Negative portable chest. Electronically Signed   By: VEAR Hurst M.D.   On: 10/09/2023 07:16   CT ABDOMEN PELVIS W CONTRAST Result Date: 10/08/2023 CLINICAL DATA:  Lower abdominal pain with black stool EXAM: CT ABDOMEN AND PELVIS WITH CONTRAST TECHNIQUE: Multidetector CT imaging of the abdomen and pelvis was performed using the standard protocol following bolus administration of intravenous contrast. RADIATION DOSE REDUCTION: This exam was performed according to the departmental dose-optimization program which includes automated exposure control, adjustment of the mA and/or kV according to patient size and/or use of iterative reconstruction technique. CONTRAST:  OMNIPAQUE  IOHEXOL  300 MG/ML  SOLN COMPARISON:  CT 08/05/2020 FINDINGS: Lower chest: Lung bases demonstrate no acute airspace disease. Hepatobiliary: No focal liver abnormality is seen.  No gallstones, gallbladder wall thickening, or biliary dilatation. Pancreas: Unremarkable. No pancreatic ductal dilatation or surrounding inflammatory changes. Spleen: Normal in size without focal  abnormality. Adrenals/Urinary Tract: Adrenal glands are normal. Bilateral parapelvic renal cysts for which no imaging follow-up is recommended. The bladder contains a small stone posteriorly. Stomach/Bowel: The stomach is within normal limits. Wall thickening and prominent mucosal enhancement with surrounding inflammatory change, appears centered over the distal duodenal bulb/second portion of duodenum, series 4, image 66, question focal stricture or narrowing in this region as well. No extraluminal gas to suggest a perforation on this exam. Remainder of the small bowel shows no distention or acute inflammation. The appendix is negative Vascular/Lymphatic: Aortic atherosclerosis. No enlarged abdominal or pelvic lymph nodes. Reproductive: Enlarged prostate Other: Negative for pelvic effusion or free air Musculoskeletal: No acute or suspicious osseous abnormality IMPRESSION: 1. Wall thickening and prominent mucosal enhancement with surrounding inflammatory change centered over the distal duodenal bulb/second portion of duodenum, question focal stricture or narrowing in this region as well. Findings could be secondary to duodenitis/peptic ulcer disease versus inflammatory benign or malignant duodenal stricture. Recommend correlation with endoscopy. No evidence for perforation on this exam. 2. Small bladder stone. 3. Enlarged prostate. 4. Aortic atherosclerosis. Aortic Atherosclerosis (ICD10-I70.0). Electronically Signed   By: Luke Bun M.D.   On: 10/08/2023 21:29    Impression:   Post prandial abdominal pain. Dark stools. Abnormal CT, thickening proximal duodenum.  Plan:   Soft diet, NPO after midnight. PPI. Endoscopy tomorrow. Risks (bleeding, infection, bowel perforation that could require surgery,  sedation-related changes in cardiopulmonary systems), benefits (identification and possible treatment of source of symptoms, exclusion of certain causes of symptoms), and alternatives (watchful waiting, radiographic imaging studies, empiric medical treatment) of upper endoscopy (EGD) were explained to patient/family in detail and patient wishes to proceed.    LOS: 0 days   Naseem Adler M  10/09/2023, 1:15 PM  Cell [phone] If no answer or after 5 PM call [phone]

## 2023-10-09 NOTE — H&P (Addendum)
 History and Physical    Thomas Li FMW:[id number] DOB: [birth year] DOA: 10/08/2023  PCP: Kennyth Worth HERO, MD  Patient coming from: [facility]  I have personally briefly reviewed patient's old medical records in [facility]  Chief Complaint: lower abdominal pain and black stools x 24 hours  HPI: Thomas Li is a 83 y.o. male with medical history significant of prediabetes, essential hypertension, hyperlipidemia and peripheral neuropathy who presents to ED with complaint of lower abdominal pain that started one day ago associated with dark stools. Patient notes no prior episode, no use of blood thinners or NSAID. He notes no n/v/d/dysuria/ sob or chest pain.    ED Course:   IN ED on evaluation patient was found to have abnormal CT scan with inflammatory changes noted  in distal duodenum an d area of focal stricture with concern for doudenitis/peptic ulcer. Patient case discussed with GI Dr Albertus who  recommended admit to [facility] and will see if plan to do EGD /Cscope  Vitals: Afeb, bp 151/73, hr 77, rr 16, sat 91%  Wbc 6, hgb14, plt 207  Na 139,K 4.4, Cl 102, glu 123, cr 0.89  Fecal occult +  UA-neg  CTAB/pelvis IMPRESSION: 1. Wall thickening and prominent mucosal enhancement with surrounding inflammatory change centered over the distal duodenal bulb/second portion of duodenum, question focal stricture or narrowing in this region as well. Findings could be secondary to duodenitis/peptic ulcer disease versus inflammatory benign or malignant duodenal stricture. Recommend correlation with endoscopy. No evidence for perforation on this exam. 2. Small bladder stone. 3. Enlarged prostate. 4. Aortic atherosclerosis.   Tx zofran  , dialudid, protonix , LR,  Review of Systems: As per HPI otherwise 10 point review of systems negative.   Past Medical History:  Diagnosis Date   Abnormal EKG    HX OF LEFT BUNDLE BRANCH BLOCK PER DR [place] NOTE 05-06-16 EPIC   Chronic  combined systolic and diastolic heart failure (HCC) 07/18/2015   NO CURRENT CARDIOLOGIST ISSUE IMPROVED   Glaucoma    Hypercholesterolemia    Hypertension    Nocturnal polyuria    Plantar fasciitis    RIGHT FOOT   PONV (postoperative nausea and vomiting)    NEEDS NAUSEA PRE MED   Weak urinary stream     Past Surgical History:  Procedure Laterality Date   ALC REPAIR Left 1985   ACL   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   TEAR DUCT SURGERY  2015   THULIUM LASER TURP (TRANSURETHRAL RESECTION OF PROSTATE) N/A 03/11/2018   Procedure: THULIUM LASER TURP (TRANSURETHRAL RESECTION OF PROSTATE);  Surgeon: Nieves Cough, MD;  Location: [facility];  Service: Urology;  Laterality: N/A;   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 His smoking use included cigarettes. He started smoking about 58 years ago. He has a 1.5 pack-year smoking history. He has never used smokeless tobacco. He reports current alcohol  use. He reports that he does not use drugs.  No Known Allergies  Family History  Problem Relation Age of Onset   Heart disease Father 71    Prior to Admission medications   Medication Sig Start Date End Date Taking? Authorizing Provider  acetaminophen  (TYLENOL ) 500 MG tablet Take 2 tablets (1,000 mg total) by mouth every 6 (six)  hours. 11/26/21  Yes Patti Rosina SAUNDERS, PA-C  amLODipine  (NORVASC ) 5 MG tablet TAKE 1 TABLET (5 MG TOTAL) BY MOUTH DAILY. 06/07/23  Yes Kennyth Worth HERO, MD  carboxymethylcellulose (REFRESH PLUS) 0.5 % SOLN Place 1 drop into both eyes at bedtime.   Yes [provider]  Coenzyme Q10 (COQ-10) 200 MG CAPS Take 200 mg by mouth at bedtime.   Yes [provider]  gabapentin  (NEURONTIN ) 300 MG capsule Take 1 capsule (300 mg total) by mouth 3 (three)  times daily. 04/08/23  Yes Kennyth Worth HERO, MD  latanoprost  (XALATAN ) 0.005 % ophthalmic solution Place 1 drop into both eyes at bedtime. 04/28/21  Yes [provider]  losartan  (COZAAR ) 100 MG tablet TAKE 1 TABLET BY MOUTH EVERY DAY 02/01/23  Yes Kennyth Worth HERO, MD  rosuvastatin  (CRESTOR ) 10 MG tablet Take 1 tablet by mouth once daily 08/12/23  Yes Parker, Caleb M, MD  senna (SENOKOT) 8.6 MG TABS tablet Take 1 tablet by mouth.   Yes [provider]  VITAMIN D PO Take 1 capsule by mouth daily.   Yes [provider]  Ascorbic Acid (VITAMIN C) POWD Take 1 Dose by mouth daily.    [provider]  mirabegron ER (MYRBETRIQ) 25 MG TB24 tablet Take 25 mg by mouth daily. 01/16/23   [provider]  Multiple Vitamin (MULTIVITAMIN WITH MINERALS) TABS tablet Take 2 tablets by mouth 2 (two) times daily.    [provider]  tolterodine (DETROL) 2 MG tablet Take 2 mg by mouth 2 (two) times daily.    [provider]    Physical Exam: Vitals:   10/09/23 0233 10/09/23 0255 10/09/23 0354 10/09/23 0422  BP:   131/75 131/75  Pulse:  (!) 59 61 61  Resp:  14 16 16   Temp: 98.3 F (36.8 C)  97.6 F (36.4 C) 97.6 F (36.4 C)  TempSrc: Oral  Oral Oral  SpO2:  96% 95%   Weight:    78.2 kg  Height:    5' 8 (1.727 m)    Constitutional: NAD, calm, comfortable Vitals:   10/09/23 0233 10/09/23 0255 10/09/23 0354 10/09/23 0422  BP:   131/75 131/75  Pulse:  (!) 59 61 61  Resp:  14 16 16   Temp: 98.3 F (36.8 C)  97.6 F (36.4 C) 97.6 F (36.4 C)  TempSrc: Oral  Oral Oral  SpO2:  96% 95%   Weight:    78.2 kg  Height:    5' 8 (1.727 m)   Eyes: PERRL, lids and conjunctivae normal ENMT: Mucous membranes are moist. Posterior pharynx clear of any exudate or lesions.Normal dentition.  Neck: normal, supple, no masses, no thyromegaly Respiratory: clear to auscultation bilaterally, no wheezing, no crackles. Normal respiratory effort. No accessory muscle  use.  Cardiovascular: Regular rate and rhythm, no murmurs / rubs / gallops. No extremity edema. 2+ pedal pulses. Abdomen: + epigastric tenderness, no masses palpated. No hepatosplenomegaly. Bowel sounds positive.  Musculoskeletal: no clubbing / cyanosis. No joint deformity upper and lower extremities. Good ROM, no contractures. Normal muscle tone.  Skin: no rashes, lesions, ulcers. No induration Neurologic: CN 2-12 grossly intact. Sensation intact,Strength 5/5 in all 4.  Psychiatric: Normal judgment and insight. Alert and oriented x 3. Normal mood.    Labs on Admission: I have personally reviewed following labs and imaging studies  CBC: Recent Labs  Lab 10/08/23 1737  WBC 6.0  HGB 14.0  HCT 41.7  MCV 101.2*  PLT 207  Basic Metabolic Panel: Recent Labs  Lab 10/08/23 1737  NA 139  K 4.4  CL 102  CO2 25  GLUCOSE 123*  BUN 11  CREATININE 0.89  CALCIUM  9.6   GFR: Estimated Creatinine Clearance: 63 mL/min (by C-G formula based on SCr of 0.89 mg/dL). Liver Function Tests: Recent Labs  Lab 10/08/23 1737  AST 37  ALT 38  ALKPHOS 54  BILITOT 0.4  PROT 6.9  ALBUMIN 4.5   Recent Labs  Lab 10/08/23 1737  LIPASE 24   No results for input(s): AMMONIA in the last 168 hours. Coagulation Profile: No results for input(s): INR, PROTIME in the last 168 hours. Cardiac Enzymes: No results for input(s): CKTOTAL, CKMB, CKMBINDEX, TROPONINI in the last 168 hours. BNP (last 3 results) No results for input(s): PROBNP in the last 8760 hours. HbA1C: No results for input(s): HGBA1C in the last 72 hours. CBG: No results for input(s): GLUCAP in the last 168 hours. Lipid Profile: No results for input(s): CHOL, HDL, LDLCALC, TRIG, CHOLHDL, LDLDIRECT in the last 72 hours. Thyroid  Function Tests: No results for input(s): TSH, T4TOTAL, FREET4, T3FREE, THYROIDAB in the last 72 hours. Anemia Panel: No results for input(s): VITAMINB12, FOLATE,  FERRITIN, TIBC, IRON, RETICCTPCT in the last 72 hours. Urine analysis:    Component Value Date/Time   COLORURINE YELLOW 10/08/2023 2102   APPEARANCEUR CLEAR 10/08/2023 2102   LABSPEC 1.011 10/08/2023 2102   PHURINE 7.5 10/08/2023 2102   GLUCOSEU NEGATIVE 10/08/2023 2102   HGBUR NEGATIVE 10/08/2023 2102   BILIRUBINUR NEGATIVE 10/08/2023 2102   KETONESUR NEGATIVE 10/08/2023 2102   PROTEINUR NEGATIVE 10/08/2023 2102   NITRITE NEGATIVE 10/08/2023 2102   LEUKOCYTESUR NEGATIVE 10/08/2023 2102    Radiological Exams on Admission: CT ABDOMEN PELVIS W CONTRAST Result Date: 10/08/2023 CLINICAL DATA:  Lower abdominal pain with black stool EXAM: CT ABDOMEN AND PELVIS WITH CONTRAST TECHNIQUE: Multidetector CT imaging of the abdomen and pelvis was performed using the standard protocol following bolus administration of intravenous contrast. RADIATION DOSE REDUCTION: This exam was performed according to the departmental dose-optimization program which includes automated exposure control, adjustment of the mA and/or kV according to patient size and/or use of iterative reconstruction technique. CONTRAST:  OMNIPAQUE  IOHEXOL  300 MG/ML  SOLN COMPARISON:  CT 08/05/2020 FINDINGS: Lower chest: Lung bases demonstrate no acute airspace disease. Hepatobiliary: No focal liver abnormality is seen. No gallstones, gallbladder wall thickening, or biliary dilatation. Pancreas: Unremarkable. No pancreatic ductal dilatation or surrounding inflammatory changes. Spleen: Normal in size without focal abnormality. Adrenals/Urinary Tract: Adrenal glands are normal. Bilateral parapelvic renal cysts for which no imaging follow-up is recommended. The bladder contains a small stone posteriorly. Stomach/Bowel: The stomach is within normal limits. Wall thickening and prominent mucosal enhancement with surrounding inflammatory change, appears centered over the distal duodenal bulb/second portion of duodenum, series 4, image 66,  question focal stricture or narrowing in this region as well. No extraluminal gas to suggest a perforation on this exam. Remainder of the small bowel shows no distention or acute inflammation. The appendix is negative Vascular/Lymphatic: Aortic atherosclerosis. No enlarged abdominal or pelvic lymph nodes. Reproductive: Enlarged prostate Other: Negative for pelvic effusion or free air Musculoskeletal: No acute or suspicious osseous abnormality IMPRESSION: 1. Wall thickening and prominent mucosal enhancement with surrounding inflammatory change centered over the distal duodenal bulb/second portion of duodenum, question focal stricture or narrowing in this region as well. Findings could be secondary to duodenitis/peptic ulcer disease versus inflammatory benign or malignant duodenal stricture. Recommend correlation with endoscopy.  No evidence for perforation on this exam. 2. Small bladder stone. 3. Enlarged prostate. 4. Aortic atherosclerosis. Aortic Atherosclerosis (ICD10-I70.0). Electronically Signed   By: Luke Bun M.D.   On: 10/08/2023 21:29    EKG: Independently reviewed.   Assessment/Plan   Upper GI bleed secondary to Duodenitis /PUD vs Malignant stricture   -npo  -continue protonix   - h/h currently stable , continue to monitor transfuse if <7  - GI to see in am with plans for EDG   Asymptomatic Hypoxemia -on [place] -resolved  - cxr pending /RVP/d-dimer -wean O2  - no pulmonary symptoms  Prediabetes -poc   Hypertension  - holding oral medications currently  -resume amlodipine  and cozaar  once tolerating po  - prn hydralazine    HLD -holding oral medications currently  -resume statin as able   Peripheral neuropathy  - holding oral medications currently     DVT prophylaxis: scd Code Status: full/ as discussed per patient wishes in event of cardiac arrest  Family Communication: none at bedside Disposition Plan: patient  expected to be admitted greater than 2 midnights  Consults  called: GI , Dr Albertus Admission status: progressive   Camila DELENA Ned MD [facility]   If 7PM-7AM, please contact night-coverage [email or web address] Password [email or web address]  10/09/2023, 6:05 AM

## 2023-10-10 ENCOUNTER — Inpatient Hospital Stay (HOSPITAL_COMMUNITY): Admitting: Anesthesiology

## 2023-10-10 ENCOUNTER — Encounter (HOSPITAL_COMMUNITY)
Admission: EM | Disposition: A | Discharge status: Home / Self Care | Payer: Self-pay | Source: Home / Self Care | Attending: Emergency Medicine

## 2023-10-10 ENCOUNTER — Encounter (HOSPITAL_COMMUNITY): Payer: Self-pay | Admitting: Internal Medicine

## 2023-10-10 DIAGNOSIS — I447 Left bundle-branch block, unspecified: Secondary | ICD-10-CM | POA: Diagnosis not present

## 2023-10-10 DIAGNOSIS — I110 Hypertensive heart disease with heart failure: Secondary | ICD-10-CM

## 2023-10-10 DIAGNOSIS — K921 Melena: Secondary | ICD-10-CM | POA: Diagnosis not present

## 2023-10-10 DIAGNOSIS — R1030 Lower abdominal pain, unspecified: Secondary | ICD-10-CM | POA: Diagnosis not present

## 2023-10-10 DIAGNOSIS — I10 Essential (primary) hypertension: Secondary | ICD-10-CM | POA: Diagnosis not present

## 2023-10-10 DIAGNOSIS — K315 Obstruction of duodenum: Secondary | ICD-10-CM | POA: Diagnosis not present

## 2023-10-10 DIAGNOSIS — K922 Gastrointestinal hemorrhage, unspecified: Secondary | ICD-10-CM | POA: Diagnosis not present

## 2023-10-10 DIAGNOSIS — R933 Abnormal findings on diagnostic imaging of other parts of digestive tract: Secondary | ICD-10-CM | POA: Diagnosis not present

## 2023-10-10 DIAGNOSIS — I5042 Chronic combined systolic (congestive) and diastolic (congestive) heart failure: Secondary | ICD-10-CM

## 2023-10-10 DIAGNOSIS — K2100 Gastro-esophageal reflux disease with esophagitis, without bleeding: Secondary | ICD-10-CM

## 2023-10-10 HISTORY — PX: BONE BIOPSY: SHX375

## 2023-10-10 HISTORY — PX: ESOPHAGOGASTRODUODENOSCOPY: SHX5428

## 2023-10-10 LAB — RENAL FUNCTION PANEL
Albumin: 3.2 g/dL — ABNORMAL LOW (ref 3.5–5.0)
Anion gap: 7 (ref 5–15)
BUN: 10 mg/dL (ref 8–23)
CO2: 27 mmol/L (ref 22–32)
Calcium: 8.7 mg/dL — ABNORMAL LOW (ref 8.9–10.3)
Chloride: 104 mmol/L (ref 98–111)
Creatinine, Ser: 0.98 mg/dL (ref 0.61–1.24)
GFR, Estimated: >60 mL/min (ref >60)
Glucose, Bld: 95 mg/dL (ref 70–99)
Phosphorus: 3.9 mg/dL (ref 2.5–4.6)
Potassium: 4.1 mmol/L (ref 3.5–5.1)
Sodium: 138 mmol/L (ref 135–145)

## 2023-10-10 LAB — CBC
HCT: 39.8 % (ref 39.0–52.0)
Hemoglobin: 13.1 g/dL (ref 13.0–17.0)
MCH: 33.8 pg (ref 26.0–34.0)
MCHC: 32.9 g/dL (ref 30.0–36.0)
MCV: 102.6 fL — ABNORMAL HIGH (ref 80.0–100.0)
Platelets: 180 K/uL (ref 150–400)
RBC: 3.88 MIL/uL — ABNORMAL LOW (ref 4.22–5.81)
RDW: 12.8 % (ref 11.5–15.5)
WBC: 6 K/uL (ref 4.0–10.5)
nRBC: 0 % (ref 0.0–0.2)

## 2023-10-10 SURGERY — EGD (ESOPHAGOGASTRODUODENOSCOPY)
Anesthesia: Monitor Anesthesia Care

## 2023-10-10 MED ORDER — PROPOFOL 500 MG/50ML IV EMUL
INTRAVENOUS | Status: DC | PRN
  Administered 2023-10-10: 75 ug/kg/min via INTRAVENOUS

## 2023-10-10 MED ORDER — PANTOPRAZOLE SODIUM 40 MG PO TBEC: 40.0000 mg | DELAYED_RELEASE_TABLET | Freq: Every day | ORAL | 3 refills | Status: AC

## 2023-10-10 MED ORDER — SODIUM CHLORIDE 0.9 % IV SOLN: INTRAVENOUS | Status: DC | PRN

## 2023-10-10 MED ORDER — PROPOFOL 10 MG/ML IV BOLUS
INTRAVENOUS | Status: DC | PRN
  Administered 2023-10-10: 50 mg via INTRAVENOUS

## 2023-10-10 MED ORDER — DROPERIDOL 2.5 MG/ML IJ SOLN: 0.6250 mg | Freq: Once | INTRAMUSCULAR | Status: DC | PRN

## 2023-10-10 MED ORDER — ACETAMINOPHEN 10 MG/ML IV SOLN
1000.0000 mg | Freq: Once | INTRAVENOUS | Status: DC | PRN
  Filled 2023-10-10: qty 100

## 2023-10-10 MED ORDER — OXYCODONE HCL 5 MG PO TABS
5.0000 mg | ORAL_TABLET | Freq: Once | ORAL | Status: DC | PRN
  Filled 2023-10-10: qty 1

## 2023-10-10 MED ORDER — OXYCODONE HCL 5 MG/5ML PO SOLN
5.0000 mg | Freq: Once | ORAL | Status: DC | PRN
  Filled 2023-10-10: qty 5

## 2023-10-10 MED ORDER — PHENYLEPHRINE HCL (PRESSORS) 10 MG/ML IV SOLN
INTRAVENOUS | Status: DC | PRN
  Administered 2023-10-10 (×2): 160 ug via INTRAVENOUS

## 2023-10-10 MED ORDER — LIDOCAINE 2% (20 MG/ML) 5 ML SYRINGE
INTRAMUSCULAR | Status: DC | PRN
  Administered 2023-10-10: 100 mg via INTRAVENOUS

## 2023-10-10 MED ORDER — FENTANYL CITRATE (PF) 100 MCG/2ML IJ SOLN: 25.0000 ug | INTRAMUSCULAR | Status: DC | PRN

## 2023-10-10 MED ORDER — ONDANSETRON 4 MG PO TBDP: 4.0000 mg | ORAL_TABLET | Freq: Three times a day (TID) | ORAL | 0 refills | Status: DC | PRN

## 2023-10-10 NOTE — Anesthesia Postprocedure Evaluation (Signed)
 Anesthesia Post Note  Patient: Thomas Li  Procedure(s) Performed: EGD (ESOPHAGOGASTRODUODENOSCOPY) BIOPSY, GI     Patient location during evaluation: PACU Anesthesia Type: MAC Level of consciousness: awake and alert Pain management: pain level controlled Vital Signs Assessment: post-procedure vital signs reviewed and stable Respiratory status: spontaneous breathing, nonlabored ventilation, respiratory function stable and patient connected to nasal cannula oxygen Cardiovascular status: stable and blood pressure returned to baseline Postop Assessment: no apparent nausea or vomiting Anesthetic complications: no   No notable events documented.  Last Vitals:  Vitals:   10/10/23 1050 10/10/23 1100  BP: 115/74 121/77  Pulse: (!) 59 65  Resp: 20 20  Temp:    SpO2: 97% 95%    Last Pain:  Vitals:   10/10/23 1042  TempSrc:   PainSc: 0-No pain                 Thom [name]

## 2023-10-10 NOTE — Interval H&P Note (Signed)
 History and Physical Interval Note:  10/10/2023 9:47 AM  Thomas Li  has presented today for surgery, with the diagnosis of melena, abdominal discomfort, abnormal CT.  The various methods of treatment have been discussed with the patient and family. After consideration of risks, benefits and other options for treatment, the patient has consented to  Procedure(s): EGD (ESOPHAGOGASTRODUODENOSCOPY) (N/A) as a surgical intervention.  The patient's history has been reviewed, patient examined, no change in status, stable for surgery.  I have reviewed the patient's chart and labs.  Questions were answered to the patient's satisfaction.     [name]

## 2023-10-10 NOTE — Op Note (Signed)
 [facility] Patient Name: Thomas Li Procedure Date : 10/10/2023 MRN: [id number] Attending MD: Elsie Cree , MD, [id number] Date of Birth: [birth year] CSN: [id number] Age: 83 Admit Type: Inpatient Procedure:                Upper GI endoscopy Indications:              Melena, Abnormal CT of the GI tract Providers:                Elsie Cree, MD, Collene Edu, RN, Fairy Marina, Technician Referring MD:             [facility] Medicines:                Monitored Anesthesia Care Complications:            No immediate complications. Estimated Blood Loss:     Estimated blood loss: none. Procedure:                Pre-Anesthesia Assessment:                           - Prior to the procedure, a History and Physical                            was performed, and patient medications and                            allergies were reviewed. The patient's tolerance of                            previous anesthesia was also reviewed. The risks                            and benefits of the procedure and the sedation                            options and risks were discussed with the patient.                            All questions were answered, and informed consent                            was obtained. Prior Anticoagulants: The patient has                            taken no anticoagulant or antiplatelet agents. ASA                            Grade Assessment: III - A patient with severe                            systemic disease. After reviewing the risks and  benefits, the patient was deemed in satisfactory                            condition to undergo the procedure.                           After obtaining informed consent, the endoscope was                            passed under direct vision. Throughout the                            procedure, the patient's blood pressure, pulse, and                             oxygen saturations were monitored continuously. The                            [id number]-[id number] ([id number]) Olympus endoscope was introduced                            through the mouth, and advanced to the duodenal                            bulb. The upper GI endoscopy was accomplished                            without difficulty. The patient tolerated the                            procedure well. Scope In: Scope Out: Findings:      The examined esophagus was normal.      The entire examined stomach was normal.      An acquired benign-appearing, intrinsic moderate stenosis was found in       the duodenal bulb and was non-traversed. 5-6 mm estimated luminal       radius. Biopsies were taken with a cold forceps for histology.      No old or fresh blood was seen to the extent of our examination. Impression:               - Normal esophagus.                           - Normal stomach.                           - Acquired duodenal stenosis. Biopsied. Suspect                            benign. NSAID-mediated? Recommendation:           - Return patient to hospital ward for ongoing care.                           - Mechanical soft diet until further notice.                           -  Continue present medications.                           - Await pathology results.                           - No aspirin , ibuprofen, naproxen, or other                            non-steroidal anti-inflammatory drugs until further                            notice.                           [facility] will follow. If tolerating diet, ok to                            discharge patient today and we'll follow-up as                            outpatient. Procedure Code(s):        --- Professional ---                           [id number], Esophagogastroduodenoscopy, flexible,                            transoral; with biopsy, single or multiple Diagnosis Code(s):        --- Professional ---                            K31.5, Obstruction of duodenum                           K92.1, Melena (includes Hematochezia)                           R93.3, Abnormal findings on diagnostic imaging of                            other parts of digestive tract CPT copyright 2022 [employer]. All rights reserved. The codes documented in this report are preliminary and upon coder review may  be revised to meet current compliance requirements. Elsie Cree, MD 10/10/2023 10:59:07 AM This report has been signed electronically. Number of Addenda: 0

## 2023-10-10 NOTE — Care Management CC44 (Signed)
 Condition Code 44 Documentation Completed  Patient Details  Name: JUSTAN GAEDE MRN: [id number] Date of Birth: 1940/05/30   Condition Code 44 given:   yes Patient signature on Condition Code 44 notice:  yes  Documentation of 2 MD's agreement:  yes  Code 44 added to claim:  yes     Alondra Vandeven G., RN 10/10/2023, 3:05 PM

## 2023-10-10 NOTE — Care Management Obs Status (Signed)
 MEDICARE OBSERVATION STATUS NOTIFICATION   Patient Details  Name: Thomas Li MRN: [id number] Date of Birth: 03/23/1940   Medicare Observation Status Notification Given:  Yes    Mardene Lessig G., RN 10/10/2023, 3:03 PM

## 2023-10-10 NOTE — Transfer of Care (Signed)
 Immediate Anesthesia Transfer of Care Note  Patient: Thomas Li  Procedure(s) Performed: EGD (ESOPHAGOGASTRODUODENOSCOPY)  Patient Location: PACU  Anesthesia Type:MAC  Level of Consciousness: drowsy  Airway & Oxygen Therapy: Patient Spontanous Breathing and Patient connected to nasal cannula oxygen  Post-op Assessment: Report given to RN and Post -op Vital signs reviewed and stable  Post vital signs: Reviewed and stable  Last Vitals:  Vitals Value Taken Time  BP 93/54 10/10/23 10:28  Temp 36.9 C 10/10/23 10:28  Pulse 55 10/10/23 10:30  Resp 15 10/10/23 10:30  SpO2 98 % 10/10/23 10:30  Vitals shown include unfiled device data.  Last Pain:  Vitals:   10/10/23 1028  TempSrc: Axillary  PainSc: Asleep      Patients Stated Pain Goal: 3 (10/09/23 1159)  Complications: No notable events documented.

## 2023-10-10 NOTE — Progress Notes (Signed)
 [facility]                                                                              Thomas Li, is a 83 y.o. male, DOB - July 12, 1940, FMW:[id number] Admit date - 10/08/2023    Outpatient Primary MD for the patient is Kennyth, Worth HERO, MD  LOS - 1  days  Chief Complaint  Patient presents with   Abdominal Pain       Brief summary   Patient is a 83 year old male with prediabetes, HTN, HLP,  peripheral neuropathy presented to ED with abdominal pain that started 1 day before and associated with dark stools.  No use of blood thinners or NSAIDs. In ED, CT scan showed inflammatory changes at the distal duodenum/second portion question focal stricture or narrowing in this region could be due to duodenitis/PUD/inflammatory, benign or malignant duodenal stricture, recommended endoscopy  Assessment & Plan   : Upper GI bleed with abdominal pain -CT abdomen with duodenitis, narrowing at the distal duodenum - Continue IV PPI - N.p.o., EGD planned today - No acute issues overnight     Hypertension - BP soft, hold losartan , amlodipine      Hypercholesterolemia -N.p.o. will resume Crestor  once started back on diet    BPH (benign prostatic hyperplasia)  Chronic combined systolic and diastolic heart failure (HCC) -Currently stable, euvolemic   Estimated body mass index is 26.21 kg/m as calculated from the following:   Height as of this encounter: 5' 8 (1.727 m).   Weight as of this encounter: 78.2 kg.  Code Status: Full code DVT Prophylaxis:  Place and maintain sequential compression device Start: 10/09/23 0955   Level of Care: Level of care: Telemetry Medical Family Communication: Updated patient's wife at the bedside on 9/13 Disposition Plan:      Remains inpatient appropriate: Pending GI evaluation   Procedures:    Consultants:   GI  Antimicrobials:   Anti-infectives (From admission, onward)    None          Medications  [MAR Hold]  amLODipine   5 mg Oral Daily   [MAR Hold] gabapentin   300 mg Oral TID   [MAR Hold] latanoprost   1 drop Both Eyes QHS   [MAR Hold] pantoprazole  (PROTONIX ) IV  40 mg Intravenous Q12H   [MAR Hold] rosuvastatin   10 mg Oral Daily   [MAR Hold] senna-docusate  1 tablet Oral BID      Subjective:   Thomas Li was seen and examined today.  Seen this morning, no acute complaints, no acute nausea vomiting, abdominal pain.  Plan for EGD today.  Objective:   Vitals:   10/10/23 1028 10/10/23 1030 10/10/23 1035 10/10/23 1042  BP: (!) 93/54 (!) 88/53 (!) 88/56 (!) 87/41  Pulse: (!) 59 (!) 55 (!) 54 (!) 59  Resp: (!) 9 17 17 17   Temp: 98.5 F (36.9 C)     TempSrc: Axillary     SpO2: 98% 98% 96% 95%  Weight:      Height:        Intake/Output Summary (Last 24 hours) at 10/10/2023 1043 Last data filed at 10/10/2023 1030 Gross per 24 hour  Intake 50 ml  Output --  Net 50 ml     Wt Readings from Last 3 Encounters:  10/09/23 78.2 kg  06/14/23 80.9 kg  04/08/23 81.8 kg    Physical Exam General: Alert and oriented x 3, NAD Cardiovascular: S1 S2 clear, RRR.  Respiratory: CTAB, no wheezing, Gastrointestinal: Soft, nontender, nondistended, NBS Ext: no pedal edema bilaterally Neuro: no new deficits Psych: Normal affect      Data Reviewed:  I have personally reviewed following labs    CBC Lab Results  Component Value Date   WBC 6.0 10/10/2023   RBC 3.88 (L) 10/10/2023   HGB 13.1 10/10/2023   HCT 39.8 10/10/2023   MCV 102.6 (H) 10/10/2023   MCH 33.8 10/10/2023   PLT 180 10/10/2023   MCHC 32.9 10/10/2023   RDW 12.8 10/10/2023   LYMPHSABS 1.1 07/16/2021   MONOABS 0.5 07/16/2021   EOSABS 0.1 07/16/2021   BASOSABS 0.0 07/16/2021     Last metabolic panel Lab Results  Component Value Date   NA 138 10/10/2023   K 4.1 10/10/2023   CL 104 10/10/2023   CO2 27 10/10/2023   BUN 10 10/10/2023   CREATININE 0.98 10/10/2023   GLUCOSE 95 10/10/2023   GFRNONAA >60 10/10/2023    CALCIUM  8.7 (L) 10/10/2023   PHOS 3.9 10/10/2023   PROT 6.9 10/08/2023   ALBUMIN 3.2 (L) 10/10/2023   BILITOT 0.4 10/08/2023   ALKPHOS 54 10/08/2023   AST 37 10/08/2023   ALT 38 10/08/2023   ANIONGAP 7 10/10/2023    CBG (last 3)  No results for input(s): GLUCAP in the last 72 hours.    Coagulation Profile: No results for input(s): INR, PROTIME in the last 168 hours.   Radiology Studies: I have personally reviewed the imaging studies  DG CHEST PORT 1 VIEW Result Date: 10/09/2023 CLINICAL DATA:  83 year old male admitted for abdominal pain with black stools. Possible peptic ulcer disease. Hypoxemia. EXAM: PORTABLE CHEST 1 VIEW COMPARISON:  CT Abdomen and Pelvis yesterday. Portable chest 02/16/2023 and earlier. FINDINGS: Portable AP semi upright view at 0644 hours. Lung volumes and mediastinal contours are within normal limits. Negative lung bases on CT yesterday. When allowing for portable technique the lungs are clear. Visualized tracheal air column is within normal limits. No pneumothorax or pleural effusion. No acute osseous abnormality identified. Paucity of bowel gas in the visible abdomen. IMPRESSION: Negative portable chest. Electronically Signed   By: VEAR Hurst M.D.   On: 10/09/2023 07:16   CT ABDOMEN PELVIS W CONTRAST Result Date: 10/08/2023 CLINICAL DATA:  Lower abdominal pain with black stool EXAM: CT ABDOMEN AND PELVIS WITH CONTRAST TECHNIQUE: Multidetector CT imaging of the abdomen and pelvis was performed using the standard protocol following bolus administration of intravenous contrast. RADIATION DOSE REDUCTION: This exam was performed according to the departmental dose-optimization program which includes automated exposure control, adjustment of the mA and/or kV according to patient size and/or use of iterative reconstruction technique. CONTRAST:  OMNIPAQUE  IOHEXOL  300 MG/ML  SOLN COMPARISON:  CT 08/05/2020 FINDINGS: Lower chest: Lung bases demonstrate no acute  airspace disease. Hepatobiliary: No focal liver abnormality is seen. No gallstones, gallbladder wall thickening, or biliary dilatation. Pancreas: Unremarkable. No pancreatic ductal dilatation or surrounding inflammatory changes. Spleen: Normal in size without focal abnormality. Adrenals/Urinary Tract: Adrenal glands are normal. Bilateral parapelvic renal cysts for which no imaging follow-up is recommended. The bladder contains a small stone posteriorly. Stomach/Bowel: The stomach is within normal limits. Wall thickening and prominent mucosal  enhancement with surrounding inflammatory change, appears centered over the distal duodenal bulb/second portion of duodenum, series 4, image 66, question focal stricture or narrowing in this region as well. No extraluminal gas to suggest a perforation on this exam. Remainder of the small bowel shows no distention or acute inflammation. The appendix is negative Vascular/Lymphatic: Aortic atherosclerosis. No enlarged abdominal or pelvic lymph nodes. Reproductive: Enlarged prostate Other: Negative for pelvic effusion or free air Musculoskeletal: No acute or suspicious osseous abnormality IMPRESSION: 1. Wall thickening and prominent mucosal enhancement with surrounding inflammatory change centered over the distal duodenal bulb/second portion of duodenum, question focal stricture or narrowing in this region as well. Findings could be secondary to duodenitis/peptic ulcer disease versus inflammatory benign or malignant duodenal stricture. Recommend correlation with endoscopy. No evidence for perforation on this exam. 2. Small bladder stone. 3. Enlarged prostate. 4. Aortic atherosclerosis. Aortic Atherosclerosis (ICD10-I70.0). Electronically Signed   By: Luke Bun M.D.   On: 10/08/2023 21:29       Malene Blaydes M.D. [facility] 10/10/2023, 10:43 AM  Available via Epic secure chat 7am-7pm After 7 pm, please refer to night coverage provider listed on amion.

## 2023-10-10 NOTE — Discharge Summary (Signed)
 Physician Discharge Summary   Patient: Thomas Li MRN: [id number] DOB: [birth year]  Admit date:     10/08/2023  Discharge date: 10/10/23  Discharge Physician: Nydia Distance, MD    PCP: Kennyth Worth HERO, MD   Recommendations at discharge:    Soft diet  Protonix  40mg  PO daily   Discharge Diagnoses: Principal Problem:   GI bleed Benign appearing moderate stenosis of duodenum    Hypertension   Hypercholesterolemia   BPH (benign prostatic hyperplasia)   Chronic combined systolic and diastolic heart failure (HCC)   GERD (gastroesophageal reflux disease)    Hospital Course: Patient is a 83 year old male with prediabetes, HTN, HLP,  peripheral neuropathy presented to ED with abdominal pain that started 1 day before and associated with dark stools.  No use of blood thinners or NSAIDs. In ED, CT scan showed inflammatory changes at the distal duodenum/second portion question focal stricture or narrowing in this region could be due to duodenitis/PUD/inflammatory, benign or malignant duodenal stricture, recommended endoscopy.   Assessment and Plan:  Upper GI bleed with abdominal pain -CT abdomen with duodenitis, narrowing at the distal duodenum - Placed on IV PPI, GI consulted  -  EGD showed normal esophagus, stomach and moderate acquired duodenal stenosis  - GI recommended soft diet, avoid NSAIDS, placed on oral protonix   - outpatient follow up in 4 weeks.        Hypertension - resume losartan , amlodipine  outpatient      Hypercholesterolemia  resume Crestor    Chronic combined systolic and diastolic heart failure (HCC) -Currently stable, euvolemic     Estimated body mass index is 26.21 kg/m as calculated from the following:   Height as of this encounter: 5' 8 (1.727 m).   Weight as of this encounter: 78.2 kg.     Pain control - [place]  Controlled Substance Reporting System database was reviewed. and patient was instructed, not to drive, operate heavy machinery,  perform activities at heights, swimming or participation in water  activities or provide baby-sitting services while on Pain, Sleep and Anxiety Medications; until their outpatient Physician has advised to do so again. Also recommended to not to take more than prescribed Pain, Sleep and Anxiety Medications.  Consultants: GI  Procedures performed: EGD  Disposition: Home Diet recommendation: SOFT DIET   DISCHARGE MEDICATION: Allergies as of 10/10/2023   No Known Allergies      Medication List     TAKE these medications    acetaminophen  650 MG CR tablet Commonly known as: TYLENOL  Take 1,300 mg by mouth 2 (two) times daily.   amLODipine  5 MG tablet Commonly known as: NORVASC  TAKE 1 TABLET (5 MG TOTAL) BY MOUTH DAILY.   carboxymethylcellulose 0.5 % Soln Commonly known as: REFRESH PLUS Place 1 drop into both eyes 2 (two) times daily as needed (eye dryness, irritation).   COQ-10 PO Take 1 capsule by mouth at bedtime.   gabapentin  300 MG capsule Commonly known as: NEURONTIN  Take 1 capsule (300 mg total) by mouth 3 (three) times daily. What changed:  how much to take when to take this additional instructions   latanoprost  0.005 % ophthalmic solution Commonly known as: XALATAN  Place 1 drop into both eyes at bedtime.   losartan  100 MG tablet Commonly known as: COZAAR  TAKE 1 TABLET BY MOUTH EVERY DAY   Multivitamin Men 50+ Tabs Take 2 tablets by mouth 2 (two) times daily.   ondansetron  4 MG disintegrating tablet Commonly known as: ZOFRAN -ODT Take 1 tablet (4 mg total) by mouth every  8 (eight) hours as needed for nausea or vomiting.   pantoprazole  40 MG tablet Commonly known as: Protonix  Take 1 tablet (40 mg total) by mouth daily before breakfast.   rosuvastatin  10 MG tablet Commonly known as: CRESTOR  Take 1 tablet by mouth once daily   SENNA PLUS PO Take 1 tablet by mouth daily.   VITAMIN D-3 PO Take 1 capsule by mouth daily.        Follow-up Information      Kennyth Worth HERO, MD. Schedule an appointment as soon as possible for a visit in 2 week(s).   Specialty: Family Medicine Why: for hospital follow-up Contact information: [address] [place] [address] [phone]         Burnette Fallow, MD. Schedule an appointment as soon as possible for a visit in 4 week(s).   Specialty: Gastroenterology Why: for hospital follow-up Contact information: [address] [place] [address] [phone]                Discharge Exam: Fredricka Weights   10/09/23 0422  Weight: 78.2 kg   S: Tolerated diet after EGD cleared by GI to discharge home   BP 121/77   Pulse 65   Temp 98.5 F (36.9 C) (Axillary)   Resp 20   Ht 5' 8 (1.727 m)   Wt 78.2 kg   SpO2 95%   BMI 26.21 kg/m    Physical Exam General: Alert and oriented x 3, NAD Cardiovascular: S1 S2 clear, RRR.  Respiratory: CTAB, no wheezing, rales or rhonchi Gastrointestinal: Soft, nontender, nondistended, NBS Ext: no pedal edema bilaterally Neuro: no new deficits Skin: No rashes Psych: Normal affect    Condition at discharge: fair  The results of significant diagnostics from this hospitalization (including imaging, microbiology, ancillary and laboratory) are listed below for reference.   Imaging Studies: DG CHEST PORT 1 VIEW Result Date: 10/09/2023 CLINICAL DATA:  83 year old male admitted for abdominal pain with black stools. Possible peptic ulcer disease. Hypoxemia. EXAM: PORTABLE CHEST 1 VIEW COMPARISON:  CT Abdomen and Pelvis yesterday. Portable chest 02/16/2023 and earlier. FINDINGS: Portable AP semi upright view at 0644 hours. Lung volumes and mediastinal contours are within normal limits. Negative lung bases on CT yesterday. When allowing for portable technique the lungs are clear. Visualized tracheal air column is within normal limits. No pneumothorax or pleural effusion. No acute osseous abnormality identified. Paucity of bowel gas in the visible  abdomen. IMPRESSION: Negative portable chest. Electronically Signed   By: VEAR Hurst M.D.   On: 10/09/2023 07:16   CT ABDOMEN PELVIS W CONTRAST Result Date: 10/08/2023 CLINICAL DATA:  Lower abdominal pain with black stool EXAM: CT ABDOMEN AND PELVIS WITH CONTRAST TECHNIQUE: Multidetector CT imaging of the abdomen and pelvis was performed using the standard protocol following bolus administration of intravenous contrast. RADIATION DOSE REDUCTION: This exam was performed according to the departmental dose-optimization program which includes automated exposure control, adjustment of the mA and/or kV according to patient size and/or use of iterative reconstruction technique. CONTRAST:  OMNIPAQUE  IOHEXOL  300 MG/ML  SOLN COMPARISON:  CT 08/05/2020 FINDINGS: Lower chest: Lung bases demonstrate no acute airspace disease. Hepatobiliary: No focal liver abnormality is seen. No gallstones, gallbladder wall thickening, or biliary dilatation. Pancreas: Unremarkable. No pancreatic ductal dilatation or surrounding inflammatory changes. Spleen: Normal in size without focal abnormality. Adrenals/Urinary Tract: Adrenal glands are normal. Bilateral parapelvic renal cysts for which no imaging follow-up is recommended. The bladder contains a small stone posteriorly. Stomach/Bowel:  The stomach is within normal limits. Wall thickening and prominent mucosal enhancement with surrounding inflammatory change, appears centered over the distal duodenal bulb/second portion of duodenum, series 4, image 66, question focal stricture or narrowing in this region as well. No extraluminal gas to suggest a perforation on this exam. Remainder of the small bowel shows no distention or acute inflammation. The appendix is negative Vascular/Lymphatic: Aortic atherosclerosis. No enlarged abdominal or pelvic lymph nodes. Reproductive: Enlarged prostate Other: Negative for pelvic effusion or free air Musculoskeletal: No acute or suspicious osseous  abnormality IMPRESSION: 1. Wall thickening and prominent mucosal enhancement with surrounding inflammatory change centered over the distal duodenal bulb/second portion of duodenum, question focal stricture or narrowing in this region as well. Findings could be secondary to duodenitis/peptic ulcer disease versus inflammatory benign or malignant duodenal stricture. Recommend correlation with endoscopy. No evidence for perforation on this exam. 2. Small bladder stone. 3. Enlarged prostate. 4. Aortic atherosclerosis. Aortic Atherosclerosis (ICD10-I70.0). Electronically Signed   By: Luke Bun M.D.   On: 10/08/2023 21:29    Microbiology: Results for orders placed or performed during the hospital encounter of 10/08/23  Respiratory (~20 pathogens) panel by PCR     Status: None   Collection Time: 10/09/23  6:28 AM   Specimen: Nasopharyngeal Swab; Respiratory  Result Value Ref Range Status   Adenovirus NOT DETECTED NOT DETECTED Final   Coronavirus 229E NOT DETECTED NOT DETECTED Final    Comment: (NOTE) The Coronavirus on the Respiratory Panel, DOES NOT test for the novel  Coronavirus (2019 nCoV)    Coronavirus HKU1 NOT DETECTED NOT DETECTED Final   Coronavirus NL63 NOT DETECTED NOT DETECTED Final   Coronavirus OC43 NOT DETECTED NOT DETECTED Final   Metapneumovirus NOT DETECTED NOT DETECTED Final   Rhinovirus / Enterovirus NOT DETECTED NOT DETECTED Final   Influenza A NOT DETECTED NOT DETECTED Final   Influenza B NOT DETECTED NOT DETECTED Final   Parainfluenza Virus 1 NOT DETECTED NOT DETECTED Final   Parainfluenza Virus 2 NOT DETECTED NOT DETECTED Final   Parainfluenza Virus 3 NOT DETECTED NOT DETECTED Final   Parainfluenza Virus 4 NOT DETECTED NOT DETECTED Final   Respiratory Syncytial Virus NOT DETECTED NOT DETECTED Final   Bordetella pertussis NOT DETECTED NOT DETECTED Final   Bordetella Parapertussis NOT DETECTED NOT DETECTED Final   Chlamydophila pneumoniae NOT DETECTED NOT DETECTED Final    Mycoplasma pneumoniae NOT DETECTED NOT DETECTED Final    Comment: Performed at [facility], [address], [place], [place] [address]    Labs: CBC: Recent Labs  Lab 10/08/23 1737 10/09/23 0921 10/10/23 0548  WBC 6.0 6.1 6.0  HGB 14.0 13.6 13.1  HCT 41.7 41.9 39.8  MCV 101.2* 102.9* 102.6*  PLT 207 202 180   Basic Metabolic Panel: Recent Labs  Lab 10/08/23 1737 10/09/23 0921 10/10/23 0548  NA 139 137 138  K 4.4 4.1 4.1  CL 102 103 104  CO2 25 26 27   GLUCOSE 123* 105* 95  BUN 11 6* 10  CREATININE 0.89 0.87 0.98  CALCIUM  9.6 8.8* 8.7*  PHOS  --   --  3.9   Liver Function Tests: Recent Labs  Lab 10/08/23 1737 10/10/23 0548  AST 37  --   ALT 38  --   ALKPHOS 54  --   BILITOT 0.4  --   PROT 6.9  --   ALBUMIN 4.5 3.2*   CBG: No results for input(s): GLUCAP in the last 168 hours.  Discharge time  spent: greater than 30 minutes.  Signed: Nydia Distance, MD [facility] 10/10/2023

## 2023-10-10 NOTE — Plan of Care (Signed)

## 2023-10-10 NOTE — Anesthesia Preprocedure Evaluation (Signed)
 Anesthesia Evaluation  Patient identified by MRN, date of birth, ID band Patient awake    Reviewed: Allergy & Precautions, H&P , NPO status , Patient's Chart, lab work & pertinent test results  History of Anesthesia Complications (+) PONV and history of anesthetic complications  Airway Mallampati: II  TM Distance: >3 FB Neck ROM: Full    Dental no notable dental hx.    Pulmonary neg pulmonary ROS, neg sleep apnea, neg COPD, former smoker   Pulmonary exam normal breath sounds clear to auscultation       Cardiovascular hypertension, (-) angina (-) Past MI Normal cardiovascular exam Rhythm:Regular Rate:Normal     Neuro/Psych neg Headaches negative neurological ROS  negative psych ROS   GI/Hepatic Neg liver ROS,GERD  ,,melena, abdominal discomfort   Endo/Other  negative endocrine ROS    Renal/GU negative Renal ROS  negative genitourinary   Musculoskeletal  (+) Arthritis ,    Abdominal   Peds negative pediatric ROS (+)  Hematology negative hematology ROS (+) Hg 13.1   Anesthesia Other Findings   Reproductive/Obstetrics negative OB ROS                              Anesthesia Physical Anesthesia Plan  ASA: 3  Anesthesia Plan: MAC   Post-op Pain Management:    Induction: Intravenous  PONV Risk Score and Plan: 2 and Propofol  infusion and Treatment may vary due to age or medical condition  Airway Management Planned: Natural Airway  Additional Equipment:   Intra-op Plan:   Post-operative Plan:   Informed Consent: I have reviewed the patients History and Physical, chart, labs and discussed the procedure including the risks, benefits and alternatives for the proposed anesthesia with the patient or authorized representative who has indicated his/her understanding and acceptance.     Dental advisory given  Plan Discussed with: CRNA  Anesthesia Plan Comments:           Anesthesia Quick Evaluation

## 2023-10-11 ENCOUNTER — Telehealth: Payer: Self-pay

## 2023-10-11 ENCOUNTER — Encounter (HOSPITAL_COMMUNITY): Payer: Self-pay | Admitting: Gastroenterology

## 2023-10-11 NOTE — Telephone Encounter (Signed)
 Pt went to ED on 9/12 and was admitted.

## 2023-10-11 NOTE — Transitions of Care (Post Inpatient/ED Visit) (Signed)
 10/11/2023  Name: Thomas Li MRN: [id number] DOB: 04/20/1940  Today's TOC FU Call Status: Today's TOC FU Call Status:: Successful TOC FU Call Completed TOC FU Call Complete Date: 10/11/23 Patient's Name and Date of Birth confirmed.  Transition Care Management Follow-up Telephone Call Date of Discharge: 10/10/23 Discharge Facility: [facility]) Type of Discharge: Inpatient Admission Primary Inpatient Discharge Diagnosis:: GI bleed How have you been since you were released from the hospital?: Better Any questions or concerns?: No  Items Reviewed: Did you receive and understand the discharge instructions provided?: Yes Medications obtained,verified, and reconciled?: Yes (Medications Reviewed) Any new allergies since your discharge?: No Dietary orders reviewed?: Yes Do you have support at home?: Yes People in Home [RPT]: spouse  Medications Reviewed Today: Medications Reviewed Today     Reviewed by Emmitt Pan, LPN (Licensed Practical Nurse) on 10/11/23 at 1514  Med List Status: <None>   Medication Order Taking? Sig Documenting Provider Last Dose Status Informant  acetaminophen  (TYLENOL ) 650 MG CR tablet [id number] Yes Take 1,300 mg by mouth 2 (two) times daily. [provider]  Active Self, Pharmacy Records  amLODipine  (NORVASC ) 5 MG tablet [id number] Yes TAKE 1 TABLET (5 MG TOTAL) BY MOUTH DAILY. Kennyth Worth HERO, MD  Active Self, Pharmacy Records  carboxymethylcellulose (REFRESH PLUS) 0.5 % SOLN [id number] Yes Place 1 drop into both eyes 2 (two) times daily as needed (eye dryness, irritation). [provider]  Active Self, Pharmacy Records  Cholecalciferol (VITAMIN D-3 PO) [id number] Yes Take 1 capsule by mouth daily. [provider]  Active Self, Pharmacy Records  Coenzyme Q10 (COQ-10 PO) [id number] Yes Take 1 capsule by mouth at bedtime. [provider]  Active Self, Pharmacy Records  gabapentin  (NEURONTIN ) 300 MG capsule [id number] Yes  Take 1 capsule (300 mg total) by mouth 3 (three) times daily.  Patient taking differently: Take 300-600 mg by mouth See admin instructions. Take 1 capsule (300mg ) by mouth in the morning and take 2 capsules (600mg ) at night.   Kennyth Worth HERO, MD  Active Self, Pharmacy Records  latanoprost  (XALATAN ) 0.005 % ophthalmic solution [id number] Yes Place 1 drop into both eyes at bedtime. [provider]  Active Self, Pharmacy Records  losartan  (COZAAR ) 100 MG tablet [id number] Yes TAKE 1 TABLET BY MOUTH EVERY DAY Kennyth Worth HERO, MD  Active Self, Pharmacy Records  Multiple Vitamins-Minerals (MULTIVITAMIN MEN 50+) TABS [id number] Yes Take 2 tablets by mouth 2 (two) times daily. [provider]  Active Self, Pharmacy Records  ondansetron  (ZOFRAN -ODT) 4 MG disintegrating tablet [id number] Yes Take 1 tablet (4 mg total) by mouth every 8 (eight) hours as needed for nausea or vomiting. Rai, Nydia POUR, MD  Active   pantoprazole  (PROTONIX ) 40 MG tablet [id number] Yes Take 1 tablet (40 mg total) by mouth daily before breakfast. Rai, Ripudeep K, MD  Active   rosuvastatin  (CRESTOR ) 10 MG tablet [id number] Yes Take 1 tablet by mouth once daily Kennyth Worth HERO, MD  Active Self, Pharmacy Records  Sennosides-Docusate Sodium  [facility] PLUS PO) [id number] Yes Take 1 tablet by mouth daily. [provider]  Active Self, Pharmacy Records            Home Care and Equipment/Supplies: Were Home Health Services Ordered?: NA Any new equipment or medical supplies ordered?: NA  Functional Questionnaire: Do you need assistance with bathing/showering or dressing?: No Do you need assistance with meal preparation?: No Do you need assistance with eating?: No Do you have difficulty maintaining continence: No Do you  need assistance with getting out of bed/getting out of a chair/moving?: No Do you have difficulty managing or taking your medications?: No  Follow up appointments reviewed: PCP Follow-up  appointment confirmed?: Yes Date of PCP follow-up appointment?: 10/13/23 Follow-up Provider: [facility] Follow-up appointment confirmed?: Yes Date of Specialist follow-up appointment?: 11/04/23 Follow-Up Specialty Provider:: GI Do you need transportation to your follow-up appointment?: No Do you understand care options if your condition(s) worsen?: Yes-patient verbalized understanding    SIGNATURE Julian Lemmings, LPN [facility] Nurse Health Advisor Direct Dial [phone]

## 2023-10-13 ENCOUNTER — Encounter: Payer: Self-pay | Admitting: Family Medicine

## 2023-10-13 ENCOUNTER — Ambulatory Visit: Admitting: Family Medicine

## 2023-10-13 VITALS — BP 124/64 | HR 68 | Temp 97.5°F | Ht 66.0 in | Wt 173.0 lb

## 2023-10-13 DIAGNOSIS — I10 Essential (primary) hypertension: Secondary | ICD-10-CM

## 2023-10-13 DIAGNOSIS — Z8719 Personal history of other diseases of the digestive system: Secondary | ICD-10-CM

## 2023-10-13 DIAGNOSIS — K219 Gastro-esophageal reflux disease without esophagitis: Secondary | ICD-10-CM | POA: Diagnosis not present

## 2023-10-13 LAB — SURGICAL PATHOLOGY

## 2023-10-13 NOTE — Patient Instructions (Signed)
 It was very nice to see you today!  VISIT SUMMARY: You had a follow-up visit after your recent hospitalization for gastrointestinal bleeding. You are feeling back to normal with no recurrence of symptoms since discharge.  YOUR PLAN: DUODENAL INFLAMMATION AND STENOSIS, POST-GI BLEED: You had inflammation and narrowing in your duodenum, which caused gastrointestinal bleeding. Your recent tests showed no active bleeding or ulcers. -Continue taking pantoprazole  for 4 weeks. -Stick to a soft, bland diet. -Stay hydrated and eat slowly. -Follow up with your gastroenterologist on October 9th.  HYPERTENSION: Your blood pressure is slightly elevated. -Recheck your blood pressure before leaving today. -Continue taking Amlodipine  5 mg daily and Losartan  100 mg daily.  Return if symptoms worsen or fail to improve.   Take care, Dr Kennyth  PLEASE NOTE:  If you had any lab tests, please let us  know if you have not heard back within a few days. You may see your results on mychart before we have a chance to review them but we will give you a call once they are reviewed by us .   If we ordered any referrals today, please let us  know if you have not heard from their office within the next week.   If you had any urgent prescriptions sent in today, please check with the pharmacy within an hour of our visit to make sure the prescription was transmitted appropriately.   Please try these tips to maintain a healthy lifestyle:  Eat at least 3 REAL meals and 1-2 snacks per day.  Aim for no more than 5 hours between eating.  If you eat breakfast, please do so within one hour of getting up.   Each meal should contain half fruits/vegetables, one quarter protein, and one quarter carbs (no bigger than a computer mouse)  Cut down on sweet beverages. This includes juice, soda, and sweet tea.   Drink at least 1 glass of water  with each meal and aim for at least 8 glasses per day  Exercise at least 150 minutes  every week.

## 2023-10-13 NOTE — Assessment & Plan Note (Signed)
 Recently admitted for GI bleed and had EGD which showed duodenal stenosis. Now on protonix  40 mg daily and will be following up with GI in a few weeks.

## 2023-10-13 NOTE — Progress Notes (Signed)
 Chief Complaint:  Thomas Li is a 83 y.o. male who presents today for a TCM visit.  Assessment/Plan:  New/Acute Problems: GI Bleed Now on Protonix  40 mg daily.  No longer having dark or tarry stools.  Will follow-up with GI in a few weeks.  We discussed reasons to return to care.  Chronic Problems Addressed Today: GERD (gastroesophageal reflux disease) Recently admitted for GI bleed and had EGD which showed duodenal stenosis. Now on protonix  40 mg daily and will be following up with GI in a few weeks.   Hypertension Initially elevated today however at goal on recheck.  He will continue amlodipine  5 mg daily and losartan  100 mg daily.  He will let us  know if persistently elevated at home.     Subjective:  HPI:  Summary of Hospital admission: Reason for admission: GI bleed Date of admission: 10/08/2023 Date of discharge: 10/10/2023 Date of Interactive contact: 10/11/2023 Summary of Hospital course: Patient presented to the ED on 10/08/2023 with abdominal pain and dark stool.  CT scan showed inflammation in distal duodenum and potential stricture.  He was admitted for GI bleed.  Placed on IV PPI and GI was consulted.  Underwent EGD which showed normal esophagus, stomach and moderate duodenal stenosis.  GI recommended soft diet and continued on oral Protonix .  He was discharged home to follow-up in 4 weeks.  Interim history:  Discussed the use of AI scribe software for clinical note transcription with the patient, who gave verbal consent to proceed.  History of Present Illness Thomas Li is an 83 year old male who presents for follow-up after a recent hospitalization for gastrointestinal bleeding.  He was hospitalized on October 08, 2023, due to abdominal pain and melena. A CT scan showed inflammation at the distal duodenum and a potential stricture. He was treated for a gastrointestinal bleed with IV proton pump inhibitors. An esophagogastroduodenoscopy revealed a normal  stomach and moderate duodenal stenosis.  Prior to hospitalization, he experienced significant upper abdominal pain and melena. He sought care when symptoms persisted. During hospitalization, a small amount of blood was found in his stool.  Since discharge, he has not experienced further abdominal pain or melena. His stool color has returned to a normal appearance, described as 'peanut butter' rather than 'dark chocolate.' He denies recent NSAID use, only taking Tylenol  for arthritis and avoiding aspirin  and ibuprofen.  He has a follow-up appointment with his gastroenterologist on November 04, 2023. He feels back to normal with no recurrence of symptoms since discharge.  No recent vomiting or nausea and has not taken ondansetron . No further episodes of abdominal pain or melena since discharge.     ROS: Per HPI, otherwise a complete review of systems was negative.   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/2015   NO CURRENT CARDIOLOGIST ISSUE IMPROVED   Glaucoma    Hypercholesterolemia    Hypertension    Nocturnal polyuria    Plantar fasciitis    RIGHT FOOT   PONV (postoperative nausea and vomiting)    NEEDS NAUSEA PRE MED   Weak urinary stream    Patient Active Problem List   Diagnosis Date Noted   GI bleed 10/08/2023   Leg edema 06/15/2022   S/P total knee arthroplasty, 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   Systolic murmur 06/11/2021   Neuropathy 12/12/2020   Osteoarthritis 05/30/2020   GERD (gastroesophageal reflux disease) 09/27/2019   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   Pre-diabetes 06/17/2017   Chronic combined systolic and diastolic heart failure (HCC) 07/18/2015   Elevated PSA 09/05/2012   LBBB (left bundle branch block) 07/24/2011   BPH (benign prostatic  hyperplasia) 05/09/2010   Hypertension    Hypercholesterolemia    Nocturnal polyuria    Past Surgical History:  Procedure Laterality Date   ALC REPAIR Left 1985   ACL   BONE BIOPSY  10/10/2023   Procedure: BIOPSY, GI;  Surgeon: Burnette Fallow, MD;  Location: [facility] ENDOSCOPY;  Service: Gastroenterology;;   ESOPHAGOGASTRODUODENOSCOPY N/A 10/10/2023   Procedure: EGD (ESOPHAGOGASTRODUODENOSCOPY);  Surgeon: Burnette Fallow, MD;  Location: [facility] ENDOSCOPY;  Service: Gastroenterology;  Laterality: N/A;   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  Current Outpatient Medications  Medication Sig Dispense Refill   acetaminophen  (TYLENOL ) 650 MG CR tablet Take 1,300 mg by mouth 2 (two) times daily.     amLODipine  (NORVASC ) 5 MG tablet TAKE 1 TABLET (5 MG TOTAL) BY MOUTH DAILY. 90 tablet 1   carboxymethylcellulose (REFRESH PLUS) 0.5 % SOLN Place 1 drop into both eyes 2 (two) times daily as needed (eye dryness, irritation).     Cholecalciferol (VITAMIN D-3 PO) Take 1 capsule by mouth daily.     Coenzyme Q10 (COQ-10 PO) Take 1 capsule by mouth at bedtime.     gabapentin  (NEURONTIN ) 300 MG capsule Take 1 capsule (300 mg total) by mouth 3 (three) times daily. (Patient taking differently: Take 300-600 mg by mouth See admin instructions. Take 1 capsule (300mg ) by mouth in the morning and take 2 capsules (600mg ) at  night.) 270 capsule 3   latanoprost  (XALATAN ) 0.005 % ophthalmic solution Place 1 drop into both eyes at bedtime.     losartan  (COZAAR ) 100 MG tablet TAKE 1 TABLET BY MOUTH EVERY DAY 90 tablet 3   Multiple Vitamins-Minerals (MULTIVITAMIN MEN 50+) TABS Take 2 tablets by mouth 2 (two) times daily.     pantoprazole  (PROTONIX ) 40 MG tablet Take 1 tablet (40 mg total) by mouth daily before breakfast. 30 tablet 3   rosuvastatin  (CRESTOR ) 10 MG tablet Take 1 tablet by mouth once daily 90 tablet 0   Sennosides-Docusate Sodium  (SENNA PLUS PO) Take 1 tablet by mouth daily.     No current facility-administered medications for this visit.    Allergies-reviewed and updated No Known Allergies  Social History   Socioeconomic History   Marital status: Married    Spouse name: Not on file   Number of children: 2   Years of education: Not on file   Highest education level: Associate degree: occupational, Scientist, product/process development, or vocational program  Occupational History   Not on file  Tobacco Use   Smoking status: Former  Current packs/day: 0.00    Average packs/day: 0.5 packs/day for 3.0 years (1.5 ttl pk-yrs)    Types: Cigarettes    Start date: 01/26/1965    Quit date: 01/27/1968    Years since quitting: 55.7   Smokeless tobacco: Never  Vaping Use   Vaping status: Never Used  Substance and Sexual Activity   Alcohol  use: Yes    Comment: WINE Q NIGHT   Drug use: No   Sexual activity: Yes    Partners: Female  Other Topics Concern   Not on file  Social History Narrative   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   Social Drivers of Health   Financial Resource Strain: Low Risk  (06/13/2023)   Overall Financial Resource Strain (CARDIA)    Difficulty of Paying Living Expenses: Not hard at all  Food Insecurity: No Food Insecurity (10/09/2023)   Hunger Vital Sign    Worried About Running Out of Food in the Last Year: Never true    Ran Out of Food in the Last Year: Never true  Transportation Needs: No Transportation Needs (10/09/2023)    PRAPARE - Administrator, Civil Service (Medical): No    Lack of Transportation (Non-Medical): No  Physical Activity: Insufficiently Active (06/13/2023)   Exercise Vital Sign    Days of Exercise per Week: 4 days    Minutes of Exercise per Session: 20 min  Stress: No Stress Concern Present (06/13/2023)   [employer] of Occupational Health - Occupational Stress Questionnaire    Feeling of Stress : Not at all  Social Connections: Socially Integrated (10/09/2023)   Social Connection and Isolation Panel    Frequency of Communication with Friends and Family: More than three times a week    Frequency of Social Gatherings with Friends and Family: Once a week    Attends Religious Services: More than 4 times per year    Active Member of [employer] or Organizations: No    Attends Engineer, structural: 1 to 4 times per year    Marital Status: Married        Objective:  Physical Exam: BP 124/64   Pulse 68   Temp (!) 97.5 F (36.4 C) (Temporal)   Ht 5' 6 (1.676 m)   Wt 173 lb (78.5 kg)   SpO2 97%   BMI 27.92 kg/m   Gen: NAD, resting comfortably CV: RRR with no murmurs appreciated Pulm: NWOB, CTAB with no crackles, wheezes, or rhonchi GI: Normal bowel sounds present. Soft, Nontender, Nondistended. MSK: No edema, cyanosis, or clubbing noted Skin: Warm, dry Neuro: Grossly normal, moves all extremities Psych: Normal affect and thought content      Joesph Marcy M. Kennyth, MD 10/13/2023 7:43 AM

## 2023-10-13 NOTE — Assessment & Plan Note (Signed)
 Initially elevated today however at goal on recheck.  He will continue amlodipine  5 mg daily and losartan  100 mg daily.  He will let us  know if persistently elevated at home.

## 2023-10-21 DIAGNOSIS — R972 Elevated prostate specific antigen [PSA]: Secondary | ICD-10-CM | POA: Diagnosis not present

## 2023-10-28 DIAGNOSIS — R972 Elevated prostate specific antigen [PSA]: Secondary | ICD-10-CM | POA: Diagnosis not present

## 2023-10-28 DIAGNOSIS — R3915 Urgency of urination: Secondary | ICD-10-CM | POA: Diagnosis not present

## 2023-11-04 DIAGNOSIS — K315 Obstruction of duodenum: Secondary | ICD-10-CM | POA: Diagnosis not present

## 2023-11-05 DIAGNOSIS — R350 Frequency of micturition: Secondary | ICD-10-CM | POA: Diagnosis not present

## 2023-11-07 ENCOUNTER — Other Ambulatory Visit: Payer: Self-pay | Admitting: Family Medicine

## 2023-11-16 ENCOUNTER — Other Ambulatory Visit: Payer: Self-pay | Admitting: Family Medicine

## 2023-12-03 DIAGNOSIS — R350 Frequency of micturition: Secondary | ICD-10-CM | POA: Diagnosis not present

## 2023-12-21 ENCOUNTER — Encounter: Admitting: Family Medicine

## 2023-12-21 DIAGNOSIS — R933 Abnormal findings on diagnostic imaging of other parts of digestive tract: Secondary | ICD-10-CM | POA: Diagnosis not present

## 2023-12-21 DIAGNOSIS — K2980 Duodenitis without bleeding: Secondary | ICD-10-CM | POA: Diagnosis not present

## 2023-12-21 DIAGNOSIS — K315 Obstruction of duodenum: Secondary | ICD-10-CM | POA: Diagnosis not present

## 2023-12-27 DIAGNOSIS — K2980 Duodenitis without bleeding: Secondary | ICD-10-CM | POA: Diagnosis not present

## 2023-12-31 DIAGNOSIS — R350 Frequency of micturition: Secondary | ICD-10-CM | POA: Diagnosis not present

## 2024-01-06 DIAGNOSIS — H402231 Chronic angle-closure glaucoma, bilateral, mild stage: Secondary | ICD-10-CM | POA: Diagnosis not present

## 2024-01-06 DIAGNOSIS — H2513 Age-related nuclear cataract, bilateral: Secondary | ICD-10-CM | POA: Diagnosis not present

## 2024-01-06 DIAGNOSIS — H35361 Drusen (degenerative) of macula, right eye: Secondary | ICD-10-CM | POA: Diagnosis not present

## 2024-01-06 LAB — OPHTHALMOLOGY REPORT-SCANNED

## 2024-01-23 ENCOUNTER — Other Ambulatory Visit: Payer: Self-pay | Admitting: Family Medicine

## 2024-03-31 ENCOUNTER — Encounter: Admitting: Family Medicine
